# Patient Record
Sex: Female | Born: 1965
Health system: Southern US, Community
[De-identification: ages and names within clinical notes are randomized; demographics above are authoritative.]

## PROBLEM LIST (undated history)

## (undated) DIAGNOSIS — F32A Depression, unspecified: Secondary | ICD-10-CM

## (undated) DIAGNOSIS — E785 Hyperlipidemia, unspecified: Secondary | ICD-10-CM

## (undated) DIAGNOSIS — F329 Major depressive disorder, single episode, unspecified: Secondary | ICD-10-CM

## (undated) DIAGNOSIS — I10 Essential (primary) hypertension: Secondary | ICD-10-CM

## (undated) DIAGNOSIS — K5792 Diverticulitis of intestine, part unspecified, without perforation or abscess without bleeding: Secondary | ICD-10-CM

## (undated) DIAGNOSIS — T7840XA Allergy, unspecified, initial encounter: Secondary | ICD-10-CM

## (undated) DIAGNOSIS — M1990 Unspecified osteoarthritis, unspecified site: Secondary | ICD-10-CM

## (undated) DIAGNOSIS — F419 Anxiety disorder, unspecified: Secondary | ICD-10-CM

## (undated) DIAGNOSIS — K8020 Calculus of gallbladder without cholecystitis without obstruction: Secondary | ICD-10-CM

## (undated) DIAGNOSIS — R609 Edema, unspecified: Secondary | ICD-10-CM

## (undated) DIAGNOSIS — K219 Gastro-esophageal reflux disease without esophagitis: Secondary | ICD-10-CM

## (undated) HISTORY — DX: Allergy, unspecified, initial encounter: T78.40XA

## (undated) HISTORY — DX: Anxiety disorder, unspecified: F41.9

## (undated) HISTORY — DX: Unspecified osteoarthritis, unspecified site: M19.90

## (undated) HISTORY — PX: GALLBLADDER SURGERY: SHX652

## (undated) HISTORY — DX: Gastro-esophageal reflux disease without esophagitis: K21.9

## (undated) HISTORY — DX: Depression, unspecified: F32.A

## (undated) HISTORY — DX: Essential (primary) hypertension: I10

## (undated) HISTORY — DX: Edema, unspecified: R60.9

## (undated) HISTORY — DX: Calculus of gallbladder without cholecystitis without obstruction: K80.20

## (undated) HISTORY — DX: Hyperlipidemia, unspecified: E78.5

## (undated) HISTORY — DX: Major depressive disorder, single episode, unspecified: F32.9

## (undated) HISTORY — DX: Diverticulitis of intestine, part unspecified, without perforation or abscess without bleeding: K57.92

---

## 1997-07-15 ENCOUNTER — Inpatient Hospital Stay (HOSPITAL_COMMUNITY): Admission: AD | Admit: 1997-07-15 | Discharge: 1997-07-18 | Payer: Self-pay | Admitting: Psychiatry

## 1999-10-12 ENCOUNTER — Emergency Department (HOSPITAL_COMMUNITY): Admission: EM | Admit: 1999-10-12 | Discharge: 1999-10-12 | Payer: Self-pay | Admitting: Emergency Medicine

## 2000-04-11 ENCOUNTER — Other Ambulatory Visit: Admission: RE | Admit: 2000-04-11 | Discharge: 2000-04-11 | Payer: Self-pay | Admitting: Obstetrics

## 2000-07-04 ENCOUNTER — Encounter: Payer: Self-pay | Admitting: Obstetrics

## 2000-07-04 ENCOUNTER — Ambulatory Visit (HOSPITAL_COMMUNITY): Admission: RE | Admit: 2000-07-04 | Discharge: 2000-07-04 | Payer: Self-pay | Admitting: Obstetrics

## 2000-08-23 ENCOUNTER — Inpatient Hospital Stay (HOSPITAL_COMMUNITY): Admission: AD | Admit: 2000-08-23 | Discharge: 2000-08-23 | Payer: Self-pay | Admitting: Obstetrics

## 2000-09-12 ENCOUNTER — Encounter: Admission: RE | Admit: 2000-09-12 | Discharge: 2000-11-01 | Payer: Self-pay | Admitting: Internal Medicine

## 2000-10-09 ENCOUNTER — Inpatient Hospital Stay (HOSPITAL_COMMUNITY): Admission: AD | Admit: 2000-10-09 | Discharge: 2000-10-13 | Payer: Self-pay | Admitting: Obstetrics

## 2000-10-10 ENCOUNTER — Encounter: Payer: Self-pay | Admitting: Obstetrics

## 2000-11-24 ENCOUNTER — Ambulatory Visit (HOSPITAL_COMMUNITY): Admission: RE | Admit: 2000-11-24 | Discharge: 2000-11-28 | Payer: Self-pay | Admitting: Obstetrics

## 2000-12-02 ENCOUNTER — Emergency Department (HOSPITAL_COMMUNITY): Admission: EM | Admit: 2000-12-02 | Discharge: 2000-12-03 | Payer: Self-pay | Admitting: Emergency Medicine

## 2004-01-24 ENCOUNTER — Emergency Department (HOSPITAL_COMMUNITY): Admission: EM | Admit: 2004-01-24 | Discharge: 2004-01-24 | Payer: Self-pay | Admitting: Emergency Medicine

## 2004-04-16 ENCOUNTER — Emergency Department (HOSPITAL_COMMUNITY): Admission: EM | Admit: 2004-04-16 | Discharge: 2004-04-16 | Payer: Self-pay

## 2004-11-02 ENCOUNTER — Emergency Department (HOSPITAL_COMMUNITY): Admission: EM | Admit: 2004-11-02 | Discharge: 2004-11-02 | Payer: Self-pay | Admitting: Family Medicine

## 2005-05-04 ENCOUNTER — Emergency Department (HOSPITAL_COMMUNITY): Admission: EM | Admit: 2005-05-04 | Discharge: 2005-05-04 | Payer: Self-pay | Admitting: Family Medicine

## 2005-06-09 ENCOUNTER — Emergency Department (HOSPITAL_COMMUNITY): Admission: EM | Admit: 2005-06-09 | Discharge: 2005-06-09 | Payer: Self-pay | Admitting: Family Medicine

## 2005-07-25 ENCOUNTER — Emergency Department (HOSPITAL_COMMUNITY): Admission: EM | Admit: 2005-07-25 | Discharge: 2005-07-25 | Payer: Self-pay | Admitting: Emergency Medicine

## 2005-09-29 ENCOUNTER — Emergency Department (HOSPITAL_COMMUNITY): Admission: EM | Admit: 2005-09-29 | Discharge: 2005-09-29 | Payer: Self-pay | Admitting: Family Medicine

## 2006-02-16 ENCOUNTER — Emergency Department (HOSPITAL_COMMUNITY): Admission: EM | Admit: 2006-02-16 | Discharge: 2006-02-16 | Payer: Self-pay | Admitting: Family Medicine

## 2008-11-14 ENCOUNTER — Emergency Department (HOSPITAL_COMMUNITY): Admission: EM | Admit: 2008-11-14 | Discharge: 2008-11-14 | Payer: Self-pay | Admitting: Family Medicine

## 2010-11-25 ENCOUNTER — Emergency Department (HOSPITAL_COMMUNITY): Payer: 59

## 2010-11-25 ENCOUNTER — Emergency Department (HOSPITAL_COMMUNITY)
Admission: EM | Admit: 2010-11-25 | Discharge: 2010-11-25 | Disposition: A | Discharge status: Home / Self Care | Payer: 59 | Attending: Emergency Medicine | Admitting: Emergency Medicine

## 2010-11-25 DIAGNOSIS — N898 Other specified noninflammatory disorders of vagina: Secondary | ICD-10-CM | POA: Insufficient documentation

## 2010-11-25 DIAGNOSIS — R1032 Left lower quadrant pain: Secondary | ICD-10-CM | POA: Insufficient documentation

## 2010-11-25 DIAGNOSIS — I10 Essential (primary) hypertension: Secondary | ICD-10-CM | POA: Insufficient documentation

## 2010-11-25 DIAGNOSIS — K5732 Diverticulitis of large intestine without perforation or abscess without bleeding: Secondary | ICD-10-CM | POA: Insufficient documentation

## 2010-11-25 LAB — CBC
MCH: 29.3 pg (ref 26.0–34.0)
MCHC: 34.6 g/dL (ref 30.0–36.0)
MCV: 84.7 fL (ref 78.0–100.0)
Platelets: 229 10*3/uL (ref 150–400)
RDW: 14.4 % (ref 11.5–15.5)

## 2010-11-25 LAB — COMPREHENSIVE METABOLIC PANEL
AST: 18 U/L (ref 0–37)
BUN: 7 mg/dL (ref 6–23)
CO2: 26 mEq/L (ref 19–32)
Calcium: 9.9 mg/dL (ref 8.4–10.5)
Chloride: 98 mEq/L (ref 96–112)
Creatinine, Ser: 0.74 mg/dL (ref 0.50–1.10)
GFR calc Af Amer: >60 mL/min (ref >60)
GFR calc non Af Amer: >60 mL/min (ref >60)
Glucose, Bld: 110 mg/dL — ABNORMAL HIGH (ref 70–99)
Total Bilirubin: 0.6 mg/dL (ref 0.3–1.2)

## 2010-11-25 LAB — DIFFERENTIAL
Basophils Relative: 0 % (ref 0–1)
Eosinophils Absolute: 0.1 10*3/uL (ref 0.0–0.7)
Eosinophils Relative: 1 % (ref 0–5)
Lymphs Abs: 2 10*3/uL (ref 0.7–4.0)
Monocytes Relative: 6 % (ref 3–12)
Neutrophils Relative %: 76 % (ref 43–77)

## 2010-11-25 LAB — URINALYSIS, ROUTINE W REFLEX MICROSCOPIC
Bilirubin Urine: NEGATIVE
Glucose, UA: NEGATIVE mg/dL
Hgb urine dipstick: NEGATIVE
Ketones, ur: NEGATIVE mg/dL
Nitrite: NEGATIVE
Protein, ur: NEGATIVE mg/dL
Specific Gravity, Urine: 1.019 (ref 1.005–1.030)
Urobilinogen, UA: 1 mg/dL (ref 0.0–1.0)
pH: 6.5 (ref 5.0–8.0)

## 2010-11-25 LAB — URINE MICROSCOPIC-ADD ON

## 2010-11-25 LAB — POCT PREGNANCY, URINE: Preg Test, Ur: NEGATIVE

## 2010-11-25 LAB — GC/CHLAMYDIA PROBE AMP, GENITAL: GC Probe Amp, Genital: NEGATIVE

## 2010-11-25 LAB — WET PREP, GENITAL: Yeast Wet Prep HPF POC: NONE SEEN

## 2010-11-25 MED ORDER — IOHEXOL 300 MG/ML  SOLN
100.0000 mL | Freq: Once | INTRAMUSCULAR | Status: AC | PRN
  Administered 2010-11-25: 100 mL via INTRAVENOUS

## 2010-12-02 ENCOUNTER — Encounter: Payer: Self-pay | Admitting: Internal Medicine

## 2010-12-02 ENCOUNTER — Ambulatory Visit (INDEPENDENT_AMBULATORY_CARE_PROVIDER_SITE_OTHER): Payer: 59 | Admitting: Internal Medicine

## 2010-12-02 DIAGNOSIS — I10 Essential (primary) hypertension: Secondary | ICD-10-CM | POA: Insufficient documentation

## 2010-12-02 DIAGNOSIS — F17200 Nicotine dependence, unspecified, uncomplicated: Secondary | ICD-10-CM

## 2010-12-02 DIAGNOSIS — K5732 Diverticulitis of large intestine without perforation or abscess without bleeding: Secondary | ICD-10-CM | POA: Insufficient documentation

## 2010-12-02 DIAGNOSIS — K5792 Diverticulitis of intestine, part unspecified, without perforation or abscess without bleeding: Secondary | ICD-10-CM

## 2010-12-02 DIAGNOSIS — R933 Abnormal findings on diagnostic imaging of other parts of digestive tract: Secondary | ICD-10-CM

## 2010-12-02 DIAGNOSIS — N814 Uterovaginal prolapse, unspecified: Secondary | ICD-10-CM

## 2010-12-02 MED ORDER — PEG-KCL-NACL-NASULF-NA ASC-C 100 G PO SOLR: 1.0000 | ORAL | Status: DC

## 2010-12-02 NOTE — Patient Instructions (Signed)
You have been scheduled to have a Colonoscopy. Your prep has been sent to your pharmacy. You have been referred to a GYN.  Your appointment is at :   [facility] [address], [address] [place] [address] Phone: [phone] Fax: [phone]  on 12/14/10 at 2:20 pm please arrive 30 minutes early.

## 2010-12-02 NOTE — Progress Notes (Signed)
Subjective:    Patient ID: Brandy [name], female    DOB: 02-Oct-1965, 45 y.o.   MRN: [id number]  HPI Brandy [name] is a 45 year old female with a PMH of hypertension and a more recent diagnosis of diverticulitis presenting for the 1st time to our clinic for followup of diverticulitis and abnormal GI imaging.  She is alone today.  She reports she developed left lower quadrant abdominal pain on August 21 and this progressed over 48 hours to sharp constant pain, which necessitated an ED visit.  At that time pain was her primary complaint, though she did have some nausea. No vomiting. She does not recall fever or chills. She reports the pain felt similar to her menstrual pain but occurred at a time when she was not menstruating.  She reports around this time her bowel movements have been normal for her, which usually is 2-3 soft but formed bowel movements daily.  She denies rectal bleeding, hematochezia and melena.  In the ER she was diagnosed with diverticulitis and given a prescription for ciprofloxacin and metronidazole which she remains on today. She has approximately 3-1/2 days left of this medication.    Today her pain is gone and she feels much much better. She does report a bad taste in her mouth and discoloration of her tongue since starting this medication.  She also has noted her bowel movements have been more loose and explosive while on antibiotics, but still no bleeding or melena.  Her nausea has also resolved.  On a side note she does report intermittent uterine prolapse which normally happens when sitting or moving her bowels. She has manually reduced her uterus on multiple occasions.  She has not been seen by gynecologist for this.  Review of Systems Constitutional: Negative for fever, chills, night sweats, activity change, appetite change and unexpected weight change HEENT: Negative for sore throat, mouth sores and trouble swallowing. Eyes: Negative for visual disturbance Respiratory:  Negative for cough, chest tightness and shortness of breath Cardiovascular: Negative for chest pain, palpitations and lower extremity swelling Gastrointestinal: [name] history of present illness Genitourinary: Negative for dysuria and hematuria. Musculoskeletal: Negative for back pain, arthralgias and myalgias Skin: Negative for rash or color change Neurological: Negative for headaches, weakness, numbness Hematological: Negative for adenopathy, negative for easy bruising/bleeding Psychiatric/behavioral: Negative for depressed mood, negative for anxiety  PMH: HTN Diverticulitis ? Uterine prolapse  Current outpatient prescriptions:ciprofloxacin (CIPRO) 500 MG tablet, Take 500 mg by mouth 2 (two) times daily.  , Disp: , Rfl: ;  cloNIDine HCl (KAPVAY) 0.1 MG TB12 ER tablet, Take 0.1 mg by mouth at bedtime.  , Disp: , Rfl: ;  hydrochlorothiazide 25 MG tablet, Take 25 mg by mouth daily.  , Disp: , Rfl: ;  HYDROcodone-acetaminophen (NORCO) 5-325 MG per tablet, Take 1 tablet by mouth every 6 (six) hours as needed.  , Disp: , Rfl:  metroNIDAZOLE (FLAGYL) 500 MG tablet, Take 500 mg by mouth 3 (three) times daily.  , Disp: , Rfl: ;  peg 3350 powder (MOVIPREP) 100 G SOLR, Take 1 kit (100 g total) by mouth as directed. [name] written handout, Disp: 1 kit, Rfl: 0  Allergies  Allergen Reactions  . Azithromycin   . Shellfish Allergy    Family History  Problem Relation Age of Onset  . Coronary artery disease Paternal Grandmother     young age  . Hypertension Father   . Diabetes Sister   . Diabetes Paternal Grandmother   -neg for CRC and colon polyps  Social History  . Marital Status: Divorced    Number of Children: 3   Occupational History  . Nurse [employer]   Social History Main Topics  . Smoking status: Current Everyday Smoker -- 0.5 packs/day for 1.5 years    Types: Cigarettes  . Smokeless tobacco: Never Used  . Alcohol Use: Yes     occ  . Drug Use: No  . Sexually Active: None       Objective:   Physical Exam BP 118/70  Pulse 68  Ht 5\' 1"  (1.549 m)  Wt 182 lb (82.555 kg)  BMI 34.39 kg/m2  LMP 11/20/2010 Constitutional: Well-developed and well-nourished. No distress. HEENT: Normocephalic and atraumatic. Oropharynx is clear and moist. No oropharyngeal exudate. Conjunctivae are normal. Pupils are equal round and reactive to light. No scleral icterus. Neck: Neck supple. Trachea midline. Cardiovascular: Normal rate, regular rhythm and intact distal pulses. No M/R/G Pulmonary/chest: Effort normal and breath sounds normal. No wheezing, rales or rhonchi. Abdominal: Soft, nontender, nondistended. There are no masses palpable. No hepatosplenomegaly. Lymphadenopathy: No cervical adenopathy noted. Neurological: Alert and oriented to person place and time. Skin: Skin is warm and dry. No rashes noted. Psychiatric: Normal mood and affect. Behavior is normal.  CBC    Component Value Date/Time   WBC 11.4* 11/25/2010 0825   RBC 4.84 11/25/2010 0825   HGB 14.2 11/25/2010 0825   HCT 41.0 11/25/2010 0825   PLT 229 11/25/2010 0825   MCV 84.7 11/25/2010 0825   MCH 29.3 11/25/2010 0825   MCHC 34.6 11/25/2010 0825   RDW 14.4 11/25/2010 0825   LYMPHSABS 2.0 11/25/2010 0825   MONOABS 0.7 11/25/2010 0825   EOSABS 0.1 11/25/2010 0825   BASOSABS 0.0 11/25/2010 0825    CMP     Component Value Date/Time   NA 135 11/25/2010 0825   K 3.4* 11/25/2010 0825   CL 98 11/25/2010 0825   CO2 26 11/25/2010 0825   GLUCOSE 110* 11/25/2010 0825   BUN 7 11/25/2010 0825   CREATININE 0.74 11/25/2010 0825   CALCIUM 9.9 11/25/2010 0825   PROT 8.8* 11/25/2010 0825   ALBUMIN 3.9 11/25/2010 0825   AST 18 11/25/2010 0825   ALT 13 11/25/2010 0825   ALKPHOS 72 11/25/2010 0825   BILITOT 0.6 11/25/2010 0825   GFRNONAA >60 11/25/2010 0825   GFRAA >60 11/25/2010 0825   CT abd/pelvis with contrast: 11/25/10: Findings: Lung bases are essentially clear.  Liver, spleen, pancreas, and adrenal glands within normal limits.    Status post cholecystectomy. No intrahepatic or extrahepatic  ductal dilatation.  Kidneys within normal limits. No hydronephrosis.  No evidence of bowel obstruction. Normal appendix. Colonic wall  thickening with inflammatory changes in the left pelvis (series  2/image 60), likely reflecting a mild sigmoid diverticulitis. No  drainable fluid collection or abscess.  No evidence of abdominal aortic aneurysm.  No suspicious abdominopelvic lymphadenopathy.  Trace pelvic fluid, likely physiologic.  Uterus and bilateral ovaries are unremarkable.  Bladder is within normal limits.  Degenerative changes of the visualized thoracolumbar spine.   IMPRESSION:  Colonic wall thickening with inflammatory changes in the left  pelvis, likely reflecting a mild sigmoid diverticulitis. No  drainable fluid collection or abscess.  Given the appearance and relative lack of additional colonic  diverticuli, colonoscopic evaluation is suggested following  resolution of acute inflammatory process.    Assessment & Plan:  45 year old female with a PMH of hypertension and a more recent diagnosis of diverticulitis presenting for the 1st time to  our clinic for followup of diverticulitis and abnormal GI imaging  1. Diverticulitis - the patient has responded very nicely to ciprofloxacin and metronidazole. She should continue and complete this ten-day course of medication. Once her infection is completely resolved, I do recommend colonoscopy mass or malignancy near the area of recent inflammation.  We briefly discussed the possibility of recurrent infection and if this occurs she should contact us immediately.  2.  ? Uterine prolapse - the patient reports a palpable mass in her vagina which reduces manually and seems to be associated with Valsalva.  This certainly could represent uterine prolapse I'll refer her to GYN for further evaluation.

## 2011-01-07 ENCOUNTER — Ambulatory Visit (AMBULATORY_SURGERY_CENTER): Payer: 59 | Admitting: Internal Medicine

## 2011-01-07 ENCOUNTER — Encounter: Payer: Self-pay | Admitting: Internal Medicine

## 2011-01-07 VITALS — BP 122/85 | HR 74 | Temp 99.3°F | Resp 14 | Ht 61.0 in | Wt 182.0 lb

## 2011-01-07 DIAGNOSIS — R933 Abnormal findings on diagnostic imaging of other parts of digestive tract: Secondary | ICD-10-CM

## 2011-01-07 MED ORDER — SODIUM CHLORIDE 0.9 % IV SOLN: 500.0000 mL | INTRAVENOUS | Status: DC

## 2011-01-07 NOTE — Progress Notes (Signed)
Readjusting bp cuff as readings 50's/30's, pt awake, talking. After readjustment, 108/73

## 2011-01-07 NOTE — Patient Instructions (Signed)
Mild diverticulosis, small external hemorrhoids  Recall colonoscopy in 10 years.  There is no need for stool testing for at least 5 years.  See blue and green sheets for additional d/c instructions.

## 2011-01-07 NOTE — Progress Notes (Signed)
Son can be in recovery to speak with Dr but not his girlfriend.

## 2011-01-10 ENCOUNTER — Telehealth: Payer: Self-pay | Admitting: *Deleted

## 2011-01-10 NOTE — Telephone Encounter (Signed)
Message left with identifier cell phone to call if concerns or questions.

## 2011-02-22 ENCOUNTER — Encounter: Payer: Self-pay | Admitting: Internal Medicine

## 2011-02-28 ENCOUNTER — Telehealth: Payer: Self-pay | Admitting: Internal Medicine

## 2011-02-28 ENCOUNTER — Ambulatory Visit: Payer: 59 | Admitting: Internal Medicine

## 2011-03-01 NOTE — Telephone Encounter (Signed)
No charge per Dr. Pyrtle °

## 2011-03-09 ENCOUNTER — Encounter: Payer: Self-pay | Admitting: Internal Medicine

## 2011-03-14 ENCOUNTER — Ambulatory Visit (INDEPENDENT_AMBULATORY_CARE_PROVIDER_SITE_OTHER): Payer: 59 | Admitting: Internal Medicine

## 2011-03-14 ENCOUNTER — Encounter: Payer: Self-pay | Admitting: Internal Medicine

## 2011-03-14 VITALS — BP 130/86 | HR 88 | Ht 61.0 in | Wt 183.0 lb

## 2011-03-14 DIAGNOSIS — R1013 Epigastric pain: Secondary | ICD-10-CM

## 2011-03-14 DIAGNOSIS — R112 Nausea with vomiting, unspecified: Secondary | ICD-10-CM

## 2011-03-14 MED ORDER — ONDANSETRON HCL 4 MG PO TABS: 4.0000 mg | ORAL_TABLET | Freq: Four times a day (QID) | ORAL | Status: DC | PRN

## 2011-03-14 MED ORDER — PANTOPRAZOLE SODIUM 40 MG PO TBEC: 40.0000 mg | DELAYED_RELEASE_TABLET | Freq: Every day | ORAL | Status: DC

## 2011-03-14 NOTE — Progress Notes (Signed)
Subjective:    Patient ID: Brandy [name], female    DOB: 1965-04-08, 45 y.o.   MRN: [id number]  HPI Brandy [name] is a 45 year old female who I saw in October for colonoscopy after an episode of diverticulitis, returning today with a new complaint of epigastric abdominal pain. Of note her colonoscopy was unremarkable except for diverticulosis. She was put on the recall for 10 years.  Today she reports 3-4 weeks of epigastric pain which radiates around to her right and into her back. She reports significant nausea associated with this pain. Her nausea has been daily, and is present when she wakes up. She has only vomited on several occasions. No hematemesis, no melena. She was given a prescription for promethazine, which helps and makes her sleepy, and therefore she has not been able to use this while working. The pain is more intermittent, and she describes this as a dull aching pain. She has associated increased upper GI gas with belching and borborygmi. She was given a trial of omeprazole 20 mg, but she has not had significant relief. She does remember having a cholecystectomy some years ago for gallstones. No fevers or chills. Bowel movements have been fairly regular but over the last several days she's noted some constipation. No rectal bleeding.  Review of Systems As per history of present illness otherwise neck  Patient Active Problem List  Diagnoses  . HTN (hypertension)  . Diverticulitis of sigmoid colon  . Tobacco dependence   Past Surgical History  Procedure Date  . Gallbladder surgery    Current Outpatient Prescriptions  Medication Sig Dispense Refill  . lisinopril-hydrochlorothiazide (PRINZIDE,ZESTORETIC) 20-25 MG per tablet Take 1 tablet by mouth daily.        . promethazine (PHENERGAN) 25 MG tablet Take 25 mg by mouth 4 (four) times daily as needed.         Allergies  Allergen Reactions  . Azithromycin   . Contrast Media (Iodinated Diagnostic Agents) Hives  . Shellfish  Allergy    Family History  Problem Relation Age of Onset  . Coronary artery disease Paternal Grandmother     young age  . Hypertension Father   . Diabetes Sister   . Diabetes Paternal Grandmother    History   Social History  . Marital Status: Divorced    Spouse Name: N/A    Number of Children: 3  . Years of Education: N/A   Occupational History  . Nurse [employer]   Social History Main Topics  . Smoking status: Current Everyday Smoker -- 0.5 packs/day for 1.5 years    Types: Cigarettes  . Smokeless tobacco: Never Used  . Alcohol Use: Yes     occ  . Drug Use: No      Objective:   Physical Exam BP 130/86  Pulse 88  Ht 5\' 1"  (1.549 m)  Wt 183 lb (83.008 kg)  BMI 34.58 kg/m2  LMP 02/28/2011 Constitutional: Well-developed and well-nourished. No distress. HEENT: Normocephalic and atraumatic. Oropharynx is clear and moist. No oropharyngeal exudate. Conjunctivae are normal. Pupils are equal round and reactive to light. No scleral icterus. Neck: Neck supple. Trachea midline. Cardiovascular: Normal rate, regular rhythm and intact distal pulses. No M/R/G Pulmonary/chest: Effort normal and breath sounds normal. No wheezing, rales or rhonchi. Abdominal: Soft, very mild epigastric tenderness without rebound or guard, nondistended. Bowel sounds active throughout. There are no masses palpable. No hepatosplenomegaly. Extremities: no clubbing, cyanosis, or edema Lymphadenopathy: No cervical adenopathy noted. Neurological: Alert and oriented to  person place and time. Skin: Skin is warm and dry. No rashes noted. Psychiatric: Normal mood and affect. Behavior is normal.    Assessment & Plan:  Is a 45 year old woman with a history of diverticulitis resolved with an unremarkable colonoscopy except for diverticulosis who presents with 3-4 weeks epigastric abdominal pain with nausea.  1. Epigastric pain/nausea -- the patient is status post cholecystectomy which makes a biliary source  for her pain more unlikely. Certainly this could be ulcer disease or gastritis. She does not use NSAIDs on a regular basis. At this point I like to change her to more potent PPI, so therefore I am discontinuing omeprazole and starting her on pantoprazole 40 mg daily. I will add ondansetron 4 mg by mouth every 6 hours when necessary for nausea. This should not make her sleepy and therefore she can use this when working. She can still use promethazine as a backup for nausea as needed. I also will schedule her for upper endoscopy for further evaluation of her pain and to rule out ulcer disease and H. pylori. I will check a CBC,   comprehensive metabolic panel, and a lipase.  I will [name] her back after the EGD as needed.

## 2011-03-14 NOTE — Patient Instructions (Addendum)
You have been given a separate informational sheet regarding your tobacco use, the importance of quitting and local resources to help you quit.  You have been scheduled for an endoscopy. Please follow written instructions given to you at your visit today.  We have sent the following medications to your pharmacy for you to pick up at your convenience: Zofran take in place of the Phenergan every 6hrs as needed. And Protonix  Start taking Protonix 1 tablet daily Discontinue Omeprozole

## 2011-04-14 ENCOUNTER — Ambulatory Visit (AMBULATORY_SURGERY_CENTER): Payer: 59 | Admitting: Internal Medicine

## 2011-04-14 ENCOUNTER — Encounter: Payer: Self-pay | Admitting: Internal Medicine

## 2011-04-14 DIAGNOSIS — K2970 Gastritis, unspecified, without bleeding: Secondary | ICD-10-CM

## 2011-04-14 DIAGNOSIS — K2990 Gastroduodenitis, unspecified, without bleeding: Secondary | ICD-10-CM

## 2011-04-14 DIAGNOSIS — R112 Nausea with vomiting, unspecified: Secondary | ICD-10-CM

## 2011-04-14 DIAGNOSIS — R1013 Epigastric pain: Secondary | ICD-10-CM

## 2011-04-14 MED ORDER — SODIUM CHLORIDE 0.9 % IV SOLN: 500.0000 mL | INTRAVENOUS | Status: DC

## 2011-04-14 MED ORDER — PROMETHAZINE HCL 25 MG PO TABS: 25.0000 mg | ORAL_TABLET | Freq: Three times a day (TID) | ORAL | Status: DC | PRN

## 2011-04-14 NOTE — Progress Notes (Signed)
Patient did not experience any of the following events: a burn prior to discharge; a fall within the facility; wrong site/side/patient/procedure/implant event; or a hospital transfer or hospital admission upon discharge from the facility. (G8907) Patient did not have preoperative order for IV antibiotic SSI prophylaxis. (G8918)  

## 2011-04-14 NOTE — Patient Instructions (Signed)
Mild gastritis- handout given on gastritis  Continue taking your medicine for reflux daily. Please take this 20-30 minutes prior to breakfast or your first meal of the day  Continue all your other medicines as directed  We will mail you a letter in 1-2 weeks with pathology results and dr pyrtle's recommendations.

## 2011-04-14 NOTE — Op Note (Signed)
[facility] [address] [place], [place]  [address]  ENDOSCOPY PROCEDURE REPORT  PATIENT:  Brandy, [name]  MR#:  [id number] BIRTHDATE:  Mar 18, 1966, 45 yrs. old  GENDER:  female ENDOSCOPIST:  Carie Caddy. Shekira Drummer, MD  PROCEDURE DATE:  04/14/2011 PROCEDURE:  EGD with biopsy for H. pylori 78295 ASA CLASS:  Class II INDICATIONS:  epigastric pain, nausea MEDICATIONS:   propofol (Diprivan) 200 mg IV, MAC sedation, administered by CRNA TOPICAL ANESTHETIC:  none  DESCRIPTION OF PROCEDURE:   After the risks benefits and alternatives of the procedure were thoroughly explained, informed consent was obtained.  The LB GIF-H180 [id number] endoscope was introduced through the mouth and advanced to the second portion of the duodenum, without limitations.  The instrument was slowly withdrawn as the mucosa was fully examined. <<PROCEDUREIMAGES>>  The esophagus and gastroesophageal junction were completely normal in appearance.  Mild to moderate gastritis was found in the body and the antrum of the stomach. Multiple biopsies were obtained and sent to pathology.  The duodenal bulb was normal in appearance, as was the postbulbar duodenum.    Retroflexed views revealed no abnormalities.    The scope was then withdrawn from the patient and the procedure completed.  COMPLICATIONS:  None  ENDOSCOPIC IMPRESSION: 1) Normal esophagus 2) Mild gastritis in the body and the antrum of the stomach. Biopsies taken and sent to pathology. 3) Normal duodenum + RECOMMENDATIONS: 1) Await pathology results 2) Follow-up of helicobacter pylori status, treat if indicated 3) Continue taking your PPI (antiacid medicine) once daily. It is best to be taken 20-30 minutes prior to breakfast meal.4) Continue as needed and as directed anti-nausea medication.  Carie Caddy. Pranika Finks, MD  CC:  The Patient  n. eSIGNED:   Carie Caddy. Genever Hentges at 04/14/2011 08:41 AM  Kayren Eaves, [phone]

## 2011-04-15 ENCOUNTER — Telehealth: Payer: Self-pay | Admitting: *Deleted

## 2011-04-15 NOTE — Telephone Encounter (Signed)
Follow up Call- Patient questions:  Do you have a fever, pain , or abdominal swelling? yes Pain Score  Pt states yesterday was "9" but seems better today.  Have you tolerated food without any problems? yes  Have you been able to return to your normal activities? yes  Do you have any questions about your discharge instructions: Diet   no Medications  no Follow up visit  no  Do you have questions or concerns about your Care? no  Actions: * If pain score is 4 or above: No action needed, pain <4.  Patient c/o pain in upper back yesterday, patient states she feels this is gas and she was able to pass a lot of gas yesterday. She feels better today but still having soreness in upper back. Pt denies any fever or nausea,vomiting. Patient tolerated food. She thinks its just gas per patient. Encouraged patient to call us back TODAY if not feeling much better. Explained to use heating pad to area and try to get rest of air out. She understands and agrees with recommendations.

## 2011-04-19 ENCOUNTER — Encounter: Payer: Self-pay | Admitting: Internal Medicine

## 2011-04-21 ENCOUNTER — Telehealth: Payer: Self-pay | Admitting: *Deleted

## 2011-04-21 NOTE — Telephone Encounter (Signed)
Pt called for results of bx from EGD. Explained to pt that Dr Rhea Belton has mailed a letter explaining her paths were negative; pt stated understanding.

## 2011-06-09 ENCOUNTER — Other Ambulatory Visit (INDEPENDENT_AMBULATORY_CARE_PROVIDER_SITE_OTHER): Payer: 59

## 2011-06-09 ENCOUNTER — Encounter: Payer: Self-pay | Admitting: Internal Medicine

## 2011-06-09 ENCOUNTER — Ambulatory Visit (INDEPENDENT_AMBULATORY_CARE_PROVIDER_SITE_OTHER): Payer: 59 | Admitting: Internal Medicine

## 2011-06-09 ENCOUNTER — Encounter: Payer: Self-pay | Admitting: Gastroenterology

## 2011-06-09 VITALS — BP 124/84 | HR 80 | Temp 98.4°F | Ht 61.0 in | Wt 179.4 lb

## 2011-06-09 DIAGNOSIS — Z7289 Other problems related to lifestyle: Secondary | ICD-10-CM

## 2011-06-09 DIAGNOSIS — R112 Nausea with vomiting, unspecified: Secondary | ICD-10-CM

## 2011-06-09 LAB — COMPREHENSIVE METABOLIC PANEL
ALT: 15 U/L (ref 0–35)
AST: 16 U/L (ref 0–37)
Calcium: 9.6 mg/dL (ref 8.4–10.5)
Chloride: 99 mEq/L (ref 96–112)
Creatinine, Ser: 0.8 mg/dL (ref 0.4–1.2)
Sodium: 137 mEq/L (ref 135–145)

## 2011-06-09 LAB — CBC WITH DIFFERENTIAL/PLATELET
Basophils Absolute: 0 10*3/uL (ref 0.0–0.1)
Eosinophils Absolute: 0.2 10*3/uL (ref 0.0–0.7)
Hemoglobin: 14 g/dL (ref 12.0–15.0)
Lymphocytes Relative: 21.7 % (ref 12.0–46.0)
MCHC: 33.7 g/dL (ref 30.0–36.0)
Neutro Abs: 6.1 10*3/uL (ref 1.4–7.7)
Neutrophils Relative %: 67.6 % (ref 43.0–77.0)
Platelets: 238 10*3/uL (ref 150.0–400.0)
RDW: 15.2 % — ABNORMAL HIGH (ref 11.5–14.6)

## 2011-06-09 MED ORDER — PROCHLORPERAZINE MALEATE 10 MG PO TABS: 5.0000 mg | ORAL_TABLET | Freq: Four times a day (QID) | ORAL | Status: AC | PRN

## 2011-06-09 NOTE — Patient Instructions (Addendum)
You have been given a separate informational sheet regarding your tobacco use, the importance of quitting and local resources to help you quit.  Your physician has requested that you go to the basement for the following lab work before leaving today: CBC, CMP, Lipase, Amylase  We have sent the following medications to your pharmacy for you to pick up at your convenience: Compazine; please take as directed.  Please abstain from Alcohol.  Follow up with Dr. Rhea Belton in 3 weeks

## 2011-06-09 NOTE — Progress Notes (Signed)
Subjective:    Patient ID: Brandy [name], female    DOB: 15-May-1965, 46 y.o.   MRN: [id number]  HPI Brandy [name] is a 46 year old female with a past medical history of hypertension, single episode of diverticulitis the fall 2012 who is seen in followup for ongoing nausea with vomiting. I last patient in January for ongoing upper GI issues and she underwent upper endoscopy which revealed mild gastritis, but biopsies were H. pylori negative. Today she reports ongoing issues with early morning nausea, often with vomiting. She reports she wakes with this symptom and struggles with nausea and off-and-on vomiting throughout the day. She reports this is not particularly worse after eating, but it is hard to eat when she is nauseous. He reports this is causing significant stress in her life and making it difficult to work. She denies abdominal pain as a predominant issue. She is not having hematemesis. She is not having diarrhea or constipation, and she has had no lower abdominal pain similar to previous episode of diverticulitis. No fevers or chills. No new medicines. The Zofran she is using but it is not helping. She does remain on Protonix 40 mg daily. She ran out of Phenergan and has not been taking this also notes this to make her very sleepy. She was recently seen by urgent care and diagnosed with a URI and started on Amoxil plus Robitussin-AC. She reports overall her appetite is poor and has lost about 10-12 pounds. She does report alcohol use but normally towards the end of the week and weekends. When asked she is not extremely specific with amount of alcohol intake.  Review of Systems Aspirin history of present illness, otherwise negative  Current Medications, Allergies, Past Medical History, Past Surgical History, Family History and Social History were reviewed in [employer] record.     Objective:   Physical Exam BP 124/84  Pulse 80  Temp 98.4 F (36.9 C)  Ht 5\' 1"   (1.549 m)  Wt 179 lb 7 oz (81.392 kg)  BMI 33.90 kg/m2  LMP 05/19/2011 Constitutional: Well-developed and well-nourished. No distress. HEENT: Normocephalic and atraumatic. Oropharynx is clear and moist. No oropharyngeal exudate. Conjunctivae are normal. Pupils are equal round and reactive to light. No scleral icterus. Neck: Neck supple. Trachea midline. Cardiovascular: Normal rate, regular rhythm and intact distal pulses. No M/R/G Pulmonary/chest: Effort normal and breath sounds normal. No wheezing, rales or rhonchi. Abdominal: Soft, nontender, nondistended. Bowel sounds active throughout. There are no masses palpable. No hepatosplenomegaly. Extremities: no clubbing, cyanosis, or edema Lymphadenopathy: No cervical adenopathy noted. Neurological: Alert and oriented to person place and time. Skin: Skin is warm and dry. No rashes noted. Psychiatric: Normal mood and affect. Behavior is normal.     Assessment & Plan:  46 year old female with a past medical history of hypertension, single episode of diverticulitis the fall 2012 who is seen in followup for ongoing nausea with vomiting  1. N/V -- she had a recent upper endoscopy which did not reveal structural lesions to explain her symptoms such as ulcer disease. She also had cross-sectional imaging in August of 2012 which did not reveal any concern other than the sigmoid diverticulitis which was treated, resolved, and then documented to be nothing more than isolated diverticulitis by colonoscopy. I do think, that she is taking more alcohol than she is letting on. This very well may contribute to her nausea and vomiting. Other potential etiologies include gastroparesis, but she does not have diabetes, and she is not vomiting  undigested food. Today I would like to check labs including CBC, comp his metabolic panel, amylase and lipase. I've asked that she make great strides to avoid all alcohol to [name] if this improves her symptoms. She is agreeable to try  this. I also will discontinue the Zofran and Phenergan and give her a trial of Compazine 5-10 mg every 8 hours when necessary nausea. I will [name] her back in 2-3 weeks' time to reassess her symptoms. We could consider gastric imaging study and repeat cross-sectional imaging if her symptoms remain.  She is agreeable to this plan. She is given a work note for today and tomorrow

## 2011-06-13 ENCOUNTER — Telehealth: Payer: Self-pay | Admitting: *Deleted

## 2011-06-13 MED ORDER — PROMETHAZINE HCL 25 MG RE SUPP: 25.0000 mg | Freq: Four times a day (QID) | RECTAL | Status: DC | PRN

## 2011-06-13 NOTE — Telephone Encounter (Signed)
lmom for pt to call back

## 2011-06-13 NOTE — Telephone Encounter (Signed)
I am not certain what has caused her vomiting.  She had fairly unremarkable EGD recently. Can rx phenergan supp if compazine is not working. Should not take both of these meds at same time. She was not hurting late last week, is she now? If so, then would repeat CT abd/pelvis. May need UC or repeat visit in our office if persistent vomiting.

## 2011-06-13 NOTE — Telephone Encounter (Signed)
Pt reports she is still vomiting; she went to work and they sent her home d/t the vomiting. She reports she starts coughing and then starts vomiting. Pt reports the day before she came to see [name], she went to an UC and was given cough syrup with Codeine. Pt reports she has taken the Compazine, but doesn't know if it helped any better than the other 2 antiemetics she tried. She has not had any Phenergan suppositories. Pt wants to go back to UC, but she was asked to wait until after I spoke with Dr Rhea Belton. Please advise.

## 2011-06-13 NOTE — Telephone Encounter (Signed)
Spoke with pt who went to [facility] and was given an inhaler for bronchitis- like s&s . She denies pain, but states she still has the discomfort in the middle of her stomach under the breast bone that can cause the nausea she experiences. She states she wakes up nauseated.   Instructed pt I will order Phenergan suppositories and she can insert one every six hours; she will probably get sleepy . She should insert one and then try to take liquids and maybe crackers; hopefully she can advance her diet. Advised her not to take Phenergan and Compazine at the same time. She will try to take the cough med with codeine and amoxicillin with some food on her stomach. She will call and update [name] on her condition.

## 2011-11-24 ENCOUNTER — Telehealth: Payer: Self-pay | Admitting: *Deleted

## 2011-11-24 MED ORDER — LISINOPRIL-HYDROCHLOROTHIAZIDE 20-25 MG PO TABS: 1.0000 | ORAL_TABLET | Freq: Every day | ORAL | Status: DC

## 2011-11-24 NOTE — Telephone Encounter (Signed)
If the medication in question is lisinopril/HCTZ, then I am fine renewing for 1 month while she is awaiting PCP.

## 2011-11-24 NOTE — Telephone Encounter (Signed)
Pt has no PCP, has been going to [facility] for scripts. She is in the process of finding a PCP, but needs her bp med filled; would you do this for 30 days? Thanks.

## 2011-11-24 NOTE — Telephone Encounter (Signed)
Informed pt Dr Rhea Belton will fill her BP med for 30 days while she finds another PCP; pt stated understanding.

## 2012-03-06 ENCOUNTER — Ambulatory Visit: Payer: 59 | Admitting: Nurse Practitioner

## 2012-03-06 ENCOUNTER — Telehealth: Payer: Self-pay | Admitting: *Deleted

## 2012-03-06 NOTE — Telephone Encounter (Signed)
Pt reports she went to the [facility] this am and instead of stool, she had blood. She is having cramping above her belly button. She had an episode of diverticulitis in 2012 per Dr Lauro Franklin note. Pt will see Willette Cluster, NP today at 3:30pm.

## 2012-11-05 ENCOUNTER — Emergency Department (HOSPITAL_COMMUNITY)
Admission: EM | Admit: 2012-11-05 | Discharge: 2012-11-05 | Disposition: A | Discharge status: Home / Self Care | Payer: 59 | Source: Home / Self Care | Attending: Family Medicine | Admitting: Family Medicine

## 2012-11-05 ENCOUNTER — Encounter (HOSPITAL_COMMUNITY): Payer: Self-pay | Admitting: Emergency Medicine

## 2012-11-05 DIAGNOSIS — L03319 Cellulitis of trunk, unspecified: Secondary | ICD-10-CM

## 2012-11-05 DIAGNOSIS — L02219 Cutaneous abscess of trunk, unspecified: Secondary | ICD-10-CM

## 2012-11-05 DIAGNOSIS — L02211 Cutaneous abscess of abdominal wall: Secondary | ICD-10-CM

## 2012-11-05 MED ORDER — DOXYCYCLINE HYCLATE 100 MG PO CAPS: 100.0000 mg | ORAL_CAPSULE | Freq: Two times a day (BID) | ORAL | Status: DC

## 2012-11-05 NOTE — ED Provider Notes (Signed)
CSN: [id number]     Arrival date & time 11/05/12  1411 History     First MD Initiated Contact with Patient 11/05/12 1554     Chief Complaint  Patient presents with  . Abscess    on abdomen x 2 wks.    (Consider location/radiation/quality/duration/timing/severity/associated sxs/prior Treatment) Patient is a 47 y.o. female presenting with abscess. The history is provided by the patient.  Abscess Location:  Torso Torso abscess location:  Abd LUQ Abscess quality: fluctuance, induration, painful and warmth   Red streaking: no   Duration:  2 weeks Progression:  Worsening Pain details:    Severity:  Mild   Progression:  Worsening Chronicity:  New Relieved by:  None tried Risk factors: no hx of MRSA and no prior abscess     Past Medical History  Diagnosis Date  . Hypertension   . Osteoarthritis   . Depression   . Gallstones    Past Surgical History  Procedure Laterality Date  . Gallbladder surgery     Family History  Problem Relation Age of Onset  . Coronary artery disease Paternal Grandmother     young age  . Hypertension Father   . Diabetes Sister   . Diabetes Paternal Grandmother    History  Substance Use Topics  . Smoking status: Current Every Day Smoker -- 0.50 packs/day for 1.5 years    Types: Cigarettes  . Smokeless tobacco: Never Used  . Alcohol Use: Yes     Comment: occ   OB History   Grav Para Term Preterm Abortions TAB SAB Ect Mult Living                 Review of Systems  Constitutional: Negative.   Skin: Positive for rash.    Allergies  Azithromycin; Contrast media; and Shellfish allergy  Home Medications   Current Outpatient Rx  Name  Route  Sig  Dispense  Refill  . lisinopril-hydrochlorothiazide (PRINZIDE,ZESTORETIC) 20-25 MG per tablet   Oral   Take 1 tablet by mouth daily.   30 tablet   0   . amoxicillin (AMOXIL) 500 MG capsule   Oral   Take 1,000 mg by mouth 2 (two) times daily.         Marland Kitchen doxycycline (VIBRAMYCIN) 100 MG  capsule   Oral   Take 1 capsule (100 mg total) by mouth 2 (two) times daily.   20 capsule   0   . guaiFENesin-codeine (ROBITUSSIN AC) 100-10 MG/5ML syrup   Oral   Take 5 mLs by mouth 4 (four) times daily as needed.         Marland Kitchen EXPIRED: pantoprazole (PROTONIX) 40 MG tablet   Oral   Take 1 tablet (40 mg total) by mouth daily.   30 tablet   6   . EXPIRED: promethazine (PHENERGAN) 25 MG suppository   Rectal   Place 1 suppository (25 mg total) rectally every 6 (six) hours as needed for nausea.   12 each   1     Please remind pt she is NOT to take Phenergan with ...    BP 148/102  Pulse 77  Temp(Src) 98.3 F (36.8 C) (Oral)  Resp 16  SpO2 100%  LMP 10/21/2012 Physical Exam  Nursing note and vitals reviewed. Constitutional: She is oriented to person, place, and time. She appears well-developed and well-nourished.  Neurological: She is alert and oriented to person, place, and time.  Skin: Skin is warm and dry. Rash noted.  Upper abd abscess, fluctuant.  ED Course   INCISION AND DRAINAGE Date/Time: 11/05/2012 4:23 PM Performed by: Linna Hoff Authorized by: Bradd Canary D Consent: Verbal consent obtained. Consent given by: patient Type: abscess Body area: trunk Local anesthetic: topical anesthetic Patient sedated: no Scalpel size: 11 Incision type: single straight Complexity: simple Drainage: purulent Drainage amount: moderate Wound treatment: wound left open Patient tolerance: Patient tolerated the procedure well with no immediate complications. Comments: Culture obtained.   (including critical care time)  Labs Reviewed - No data to display No results found. 1. Abscess of abdominal wall     MDM    Linna Hoff, MD 11/05/12 [phone]

## 2012-11-05 NOTE — ED Notes (Signed)
Pt reports abscess on abdomen x 2 wks. Gradually getting worse. Area is swollen and tender to touch. Pt states that she used fat back and was not able to induce drainage. Denies fever and any other symptoms.

## 2012-11-09 LAB — CULTURE, ROUTINE-ABSCESS

## 2012-11-09 NOTE — ED Notes (Addendum)
Abscess culture abdomen: Abundant Peptostreptococcus.  No sensitivity. Pt. treated with Doxycycline.  Message to Dr. Artis Flock. Brandy [name] 11/09/2012 Dr. Artis Flock reviewed and said it was OK. Brandy [name] 11/12/2012

## 2013-04-25 ENCOUNTER — Emergency Department (HOSPITAL_COMMUNITY)
Admission: EM | Admit: 2013-04-25 | Discharge: 2013-04-25 | Disposition: A | Discharge status: Home / Self Care | Payer: 59 | Source: Home / Self Care

## 2013-04-25 ENCOUNTER — Encounter (HOSPITAL_COMMUNITY): Payer: Self-pay | Admitting: Emergency Medicine

## 2013-04-25 DIAGNOSIS — I10 Essential (primary) hypertension: Secondary | ICD-10-CM

## 2013-04-25 DIAGNOSIS — J069 Acute upper respiratory infection, unspecified: Secondary | ICD-10-CM

## 2013-04-25 DIAGNOSIS — J45909 Unspecified asthma, uncomplicated: Secondary | ICD-10-CM

## 2013-04-25 LAB — POCT RAPID STREP A: Streptococcus, Group A Screen (Direct): NEGATIVE

## 2013-04-25 MED ORDER — ALBUTEROL SULFATE HFA 108 (90 BASE) MCG/ACT IN AERS: 1.0000 | INHALATION_SPRAY | Freq: Four times a day (QID) | RESPIRATORY_TRACT | Status: DC | PRN

## 2013-04-25 MED ORDER — LISINOPRIL-HYDROCHLOROTHIAZIDE 20-25 MG PO TABS: 1.0000 | ORAL_TABLET | Freq: Every day | ORAL | Status: DC

## 2013-04-25 NOTE — ED Provider Notes (Signed)
Medical screening examination/treatment/procedure(s) were performed by non-physician practitioner and as supervising physician I was immediately available for consultation/collaboration.  Philipp Deputy, M.D.   Harden Mo, MD 04/25/13 [phone]

## 2013-04-25 NOTE — ED Notes (Signed)
C/o cold sx that include cough with mucous, headache, chest discomfort due to cough, sore throat, and nausea that started 2 days ago. Pain is 6/10. Stated that she has taking OTC medications with no relief. B/p is 165/95, stated that she has not had any b/p medications in a long time, need a prescription. Denies any other symptoms. Written by: Lenore Manner, SMA

## 2013-04-25 NOTE — Discharge Instructions (Signed)
Bronchospasm, Adult A bronchospasm is when the tubes that carry air in and out of your lungs (airwarys) spasm or tighten. During a bronchospasm it is hard to breathe. This is because the airways get smaller. A bronchospasm can be triggered by:  Allergies. These may be to animals, pollen, food, or mold.  Infection. This is a common cause of bronchospasm.  Exercise.  Irritants. These include pollution, cigarette smoke, strong odors, aerosol sprays, and paint fumes.  Weather changes.  Stress.  Being emotional. HOME CARE   Always have a plan for getting help. Know when to call your doctor and local emergency services (911 in the [place] Know where you can get emergency care.  Only take medicines as told by your doctor.  If you were prescribed an inhaler or nebulizer machine, ask your doctor how to use it correctly. Always use a spacer with your inhaler if you were given one.  Stay calm during an attack. Try to relax and breathe more slowly.  Control your home environment:  Change your heating and air conditioning filter at least once a month.  Limit your use of fireplaces and wood stoves.  Do not  smoke. Do not  allow smoking in your home.  Avoid perfumes and fragrances.  Get rid of pests (such as roaches and mice) and their droppings.  Throw away plants if you see mold on them.  Keep your house clean and dust free.  Replace carpet with wood, tile, or vinyl flooring. Carpet can trap dander and dust.  Use allergy-proof pillows, mattress covers, and box spring covers.  Wash bed sheets and blankets every week in hot water. Dry them in a dryer.  Use blankets that are made of polyester or cotton.  Wash hands frequently. GET HELP IF:  You have muscle aches.  You have chest pain.  The thick spit you spit or cough up (sputum) changes from clear or white to yellow, green, gray, or bloody.  The thick spit you spit or cough up gets thicker.  There are problems that may be  related to the medicine you are given such as:  A rash.  Itching.  Swelling.  Trouble breathing. GET HELP RIGHT AWAY IF:  You feel you cannot breathe or catch your breath.  You cannot stop coughing.  Your treatment is not helping you breathe better. MAKE SURE YOU:   Understand these instructions.  Will watch your condition.  Will get help right away if you are not doing well or get worse. Document Released: 01/16/2009 Document Revised: 11/21/2012 Document Reviewed: 09/11/2012 [facility] Patient Information 2014 [place].  Upper Respiratory Infection, Adult An upper respiratory infection (URI) is also sometimes known as the common cold. The upper respiratory tract includes the nose, sinuses, throat, trachea, and bronchi. Bronchi are the airways leading to the lungs. Most people improve within 1 week, but symptoms can last up to 2 weeks. A residual cough may last even longer.  CAUSES Many different viruses can infect the tissues lining the upper respiratory tract. The tissues become irritated and inflamed and often become very moist. Mucus production is also common. A cold is contagious. You can easily spread the virus to others by oral contact. This includes kissing, sharing a glass, coughing, or sneezing. Touching your mouth or nose and then touching a surface, which is then touched by another person, can also spread the virus. SYMPTOMS  Symptoms typically develop 1 to 3 days after you come in contact with a cold virus. Symptoms vary  from person to person. They may include:  Runny nose.  Sneezing.  Nasal congestion.  Sinus irritation.  Sore throat.  Loss of voice (laryngitis).  Cough.  Fatigue.  Muscle aches.  Loss of appetite.  Headache.  Low-grade fever. DIAGNOSIS  You might diagnose your own cold based on familiar symptoms, since most people get a cold 2 to 3 times a year. Your caregiver can confirm this based on your exam. Most importantly, your  caregiver can check that your symptoms are not due to another disease such as strep throat, sinusitis, pneumonia, asthma, or epiglottitis. Blood tests, throat tests, and X-rays are not necessary to diagnose a common cold, but they may sometimes be helpful in excluding other more serious diseases. Your caregiver will decide if any further tests are required. RISKS AND COMPLICATIONS  You may be at risk for a more severe case of the common cold if you smoke cigarettes, have chronic heart disease (such as heart failure) or lung disease (such as asthma), or if you have a weakened immune system. The very young and very old are also at risk for more serious infections. Bacterial sinusitis, middle ear infections, and bacterial pneumonia can complicate the common cold. The common cold can worsen asthma and chronic obstructive pulmonary disease (COPD). Sometimes, these complications can require emergency medical care and may be life-threatening. PREVENTION  The best way to protect against getting a cold is to practice good hygiene. Avoid oral or hand contact with people with cold symptoms. Wash your hands often if contact occurs. There is no clear evidence that vitamin C, vitamin E, echinacea, or exercise reduces the chance of developing a cold. However, it is always recommended to get plenty of rest and practice good nutrition. TREATMENT  Treatment is directed at relieving symptoms. There is no cure. Antibiotics are not effective, because the infection is caused by a virus, not by bacteria. Treatment may include:  Increased fluid intake. Sports drinks offer valuable electrolytes, sugars, and fluids.  Breathing heated mist or steam (vaporizer or shower).  Eating chicken soup or other clear broths, and maintaining good nutrition.  Getting plenty of rest.  Using gargles or lozenges for comfort.  Controlling fevers with ibuprofen or acetaminophen as directed by your caregiver.  Increasing usage of your  inhaler if you have asthma. Zinc gel and zinc lozenges, taken in the first 24 hours of the common cold, can shorten the duration and lessen the severity of symptoms. Pain medicines may help with fever, muscle aches, and throat pain. A variety of non-prescription medicines are available to treat congestion and runny nose. Your caregiver can make recommendations and may suggest nasal or lung inhalers for other symptoms.  HOME CARE INSTRUCTIONS   Only take over-the-counter or prescription medicines for pain, discomfort, or fever as directed by your caregiver.  Use a warm mist humidifier or inhale steam from a shower to increase air moisture. This may keep secretions moist and make it easier to breathe.  Drink enough water and fluids to keep your urine clear or pale yellow.  Rest as needed.  Return to work when your temperature has returned to normal or as your caregiver advises. You may need to stay home longer to avoid infecting others. You can also use a face mask and careful hand washing to prevent spread of the virus. SEEK MEDICAL CARE IF:   After the first few days, you feel you are getting worse rather than better.  You need your caregiver's advice about medicines to  control symptoms.  You develop chills, worsening shortness of breath, or brown or red sputum. These may be signs of pneumonia.  You develop yellow or brown nasal discharge or pain in the face, especially when you bend forward. These may be signs of sinusitis.  You develop a fever, swollen neck glands, pain with swallowing, or white areas in the back of your throat. These may be signs of strep throat. SEEK IMMEDIATE MEDICAL CARE IF:   You have a fever.  You develop severe or persistent headache, ear pain, sinus pain, or chest pain.  You develop wheezing, a prolonged cough, cough up blood, or have a change in your usual mucus (if you have chronic lung disease).  You develop sore muscles or a stiff neck. Document  Released: 09/14/2000 Document Revised: 06/13/2011 Document Reviewed: 07/23/2010 [facility] Patient Information 2014 [place], [place].  How to Use an Inhaler Using your inhaler correctly is very important. Good technique will make sure that the medicine reaches your lungs.  HOW TO USE AN INHALER: 1. Take the cap off the inhaler. 2. If this is the first time using your inhaler, you need to prime it. Shake the inhaler for 5 seconds. Release four puffs into the air, away from your face. Ask your doctor for help if you have questions. 3. Shake the inhaler for 5 seconds. 4. Turn the inhaler so the bottle is above the mouthpiece. 5. Put your pointer finger on top of the bottle. Your thumb holds the bottom of the inhaler. 6. Open your mouth. 7. Either hold the inhaler away from your mouth (the width of 2 fingers) or place your lips tightly around the mouthpiece. Ask your doctor which way to use your inhaler. 8. Breathe out as much air as possible. 9. Breathe in and push down on the bottle 1 time to release the medicine. You will feel the medicine go in your mouth and throat. 10. Continue to take a deep breath in very slowly. Try to fill your lungs. 11. After you have breathed in completely, hold your breath for 10 seconds. This will help the medicine to settle in your lungs. If you cannot hold your breath for 10 seconds, hold it for as long as you can before you breathe out. 12. Breathe out slowly, through pursed lips. Whistling is an example of pursed lips. 13. If your doctor has told you to take more than 1 puff, wait at least 15 30 seconds between puffs. This will help you get the best results from your medicine. Do not use the inhaler more than your doctor tells you to. 14. Put the cap back on the inhaler. 15. Follow the directions from your doctor or from the inhaler package about cleaning the inhaler. If you use more than one inhaler, ask your doctor which inhalers to use and what order to use them  in. Ask your doctor to help you figure out when you will need to refill your inhaler.  If you use a steroid inhaler, always rinse your mouth with water after your last puff, gargle and spit out the water. Do not swallow the water. GET HELP IF:  The inhaler medicine only partially helps to stop wheezing or shortness of breath.  You are having trouble using your inhaler.  You have some increase in thick spit (phlegm). GET HELP RIGHT AWAY IF:  The inhaler medicine does not help your wheezing or shortness of breath or you have tightness in your chest.  You have dizziness, headaches, or  fast heart rate.  You have chills, fever, or night sweats.  You have a large increase of thick spit, or your thick spit is bloody. MAKE SURE YOU:   Understand these instructions.  Will watch your condition.  Will get help right away if you are not doing well or get worse. Document Released: 12/29/2007 Document Revised: 01/09/2013 Document Reviewed: 10/18/2012 [facility] Patient Information 2014 [place], [place].  Cough, Adult  A cough is a reflex. It helps you clear your throat and airways. A cough can help heal your body. A cough can last 2 or 3 weeks (acute) or may last more than 8 weeks (chronic). Some common causes of a cough can include an infection, allergy, or a cold. HOME CARE  Only take medicine as told by your doctor.  If given, take your medicines (antibiotics) as told. Finish them even if you start to feel better.  Use a cold steam vaporizer or humidier in your home. This can help loosen thick spit (secretions).  Sleep so you are almost sitting up (semi-upright). Use pillows to do this. This helps reduce coughing.  Rest as needed.  Stop smoking if you smoke. GET HELP RIGHT AWAY IF:  You have yellowish-white fluid (pus) in your thick spit.  Your cough gets worse.  Your medicine does not reduce coughing, and you are losing sleep.  You cough up blood.  You have trouble  breathing.  Your pain gets worse and medicine does not help.  You have a fever. MAKE SURE YOU:   Understand these instructions.  Will watch your condition.  Will get help right away if you are not doing well or get worse. Document Released: 12/02/2010 Document Revised: 06/13/2011 Document Reviewed: 12/02/2010 [facility] Patient Information 2014 [place].

## 2013-04-25 NOTE — ED Notes (Signed)
Spoke to [employer]  Requesting prior authorization-informed we do not do this.  Pharmacy said they would attempt "proair" .  Notified david mabe, np of this conversation and no instructions received

## 2013-04-25 NOTE — ED Provider Notes (Signed)
CSN: [id number]     Arrival date & time 04/25/13  6433 History   First MD Initiated Contact with Patient 04/25/13 [phone]     Chief Complaint  Patient presents with  . URI   (Consider location/radiation/quality/duration/timing/severity/associated sxs/prior Treatment) HPI Comments: 48 year old female who also is a smoker is complaining of a sore throat for one week. Describes it as a burning ache. Also with PMD and ears are itching , st frequent cough.   Past Medical History  Diagnosis Date  . Hypertension   . Osteoarthritis   . Depression   . Gallstones    Past Surgical History  Procedure Laterality Date  . Gallbladder surgery     Family History  Problem Relation Age of Onset  . Coronary artery disease Paternal Grandmother     young age  . Hypertension Father   . Diabetes Sister   . Diabetes Paternal Grandmother    History  Substance Use Topics  . Smoking status: Current Every Day Smoker -- 0.50 packs/day for 1.5 years    Types: Cigarettes  . Smokeless tobacco: Never Used  . Alcohol Use: Yes     Comment: occ   OB History   Grav Para Term Preterm Abortions TAB SAB Ect Mult Living                 Review of Systems  Constitutional: Negative for fever, chills, activity change, appetite change and fatigue.  HENT: Positive for congestion, postnasal drip, rhinorrhea and sore throat. Negative for facial swelling.   Eyes: Negative.   Respiratory: Positive for cough.   Cardiovascular: Negative.   Gastrointestinal: Negative.   Genitourinary: Negative.   Musculoskeletal: Negative for neck pain and neck stiffness.  Skin: Negative for pallor and rash.  Neurological: Negative.     Allergies  Azithromycin; Contrast media; and Shellfish allergy  Home Medications   Current Outpatient Rx  Name  Route  Sig  Dispense  Refill  . albuterol (PROVENTIL HFA;VENTOLIN HFA) 108 (90 BASE) MCG/ACT inhaler   Inhalation   Inhale 1-2 puffs into the lungs every 6 (six) hours as needed for  wheezing or shortness of breath.   1 Inhaler   0   . amoxicillin (AMOXIL) 500 MG capsule   Oral   Take 1,000 mg by mouth 2 (two) times daily.         Marland Kitchen doxycycline (VIBRAMYCIN) 100 MG capsule   Oral   Take 1 capsule (100 mg total) by mouth 2 (two) times daily.   20 capsule   0   . guaiFENesin-codeine (ROBITUSSIN AC) 100-10 MG/5ML syrup   Oral   Take 5 mLs by mouth 4 (four) times daily as needed.         Marland Kitchen lisinopril-hydrochlorothiazide (PRINZIDE,ZESTORETIC) 20-25 MG per tablet   Oral   Take 1 tablet by mouth daily.   30 tablet   0   . lisinopril-hydrochlorothiazide (ZESTORETIC) 20-25 MG per tablet   Oral   Take 1 tablet by mouth daily.   20 tablet   0    BP 165/95  Pulse 84  Temp(Src) 98.9 F (37.2 C) (Oral)  Resp 18  SpO2 97%  LMP 03/29/2013 Physical Exam  Nursing note and vitals reviewed. Constitutional: She is oriented to person, place, and time. She appears well-developed and well-nourished. No distress.  HENT:  Mouth/Throat: No oropharyngeal exudate.  Bilateral TMs are normal Oropharynx with erythema and clear PND.  Eyes: Conjunctivae and EOM are normal.  Neck: Normal range of motion. Neck  supple.  Cardiovascular: Normal rate, regular rhythm and normal heart sounds.   Pulmonary/Chest: Effort normal. No respiratory distress. She has wheezes. She exhibits no tenderness.  Prolonged expiratory phase  Musculoskeletal: Normal range of motion. She exhibits no edema.  Lymphadenopathy:    She has no cervical adenopathy.  Neurological: She is alert and oriented to person, place, and time.  Skin: Skin is warm and dry. No rash noted.  Psychiatric: She has a normal mood and affect.    ED Course  Procedures (including critical care time) Labs Review Labs Reviewed  POCT RAPID STREP A ([facility] CARE ONLY)   Imaging Review No results found. Results for orders placed during the hospital encounter of 04/25/13  POCT RAPID STREP A ([facility] CARE ONLY)      Result  Value Range   Streptococcus, Group A Screen (Direct) NEGATIVE  NEGATIVE      MDM   1. URI (upper respiratory infection)   2. RAD (reactive airway disease) with wheezing   3. HTN (hypertension)     Stop smoking Use the albuterol he can take 2 puffs every 4 hours when necessary cough and wheeze Recommend how to seltzer cold plus nighttime medication for symptoms Plenty of fluids stay well hydrated Followup with her doctor as needed. Zestoretic 20-25 daily. Out of her meds. Assigned PCP as soon as possible for primary care, blood pressure management and lab work.   Janne Napoleon, NP 04/25/13 [phone]

## 2013-04-27 LAB — CULTURE, GROUP A STREP

## 2013-11-20 ENCOUNTER — Other Ambulatory Visit: Payer: Self-pay | Admitting: Orthopedic Surgery

## 2013-11-20 DIAGNOSIS — S43421A Sprain of right rotator cuff capsule, initial encounter: Secondary | ICD-10-CM

## 2013-11-27 ENCOUNTER — Ambulatory Visit
Admission: RE | Admit: 2013-11-27 | Discharge: 2013-11-27 | Disposition: A | Discharge status: Home / Self Care | Payer: 59 | Source: Ambulatory Visit | Attending: Orthopedic Surgery | Admitting: Orthopedic Surgery

## 2013-11-27 DIAGNOSIS — S43421A Sprain of right rotator cuff capsule, initial encounter: Secondary | ICD-10-CM

## 2013-11-27 MED ORDER — GADOBENATE DIMEGLUMINE 529 MG/ML IV SOLN
15.0000 mL | Freq: Once | INTRAVENOUS | Status: AC | PRN
  Administered 2013-11-27: 15 mL via INTRAVENOUS

## 2015-02-01 ENCOUNTER — Inpatient Hospital Stay (HOSPITAL_COMMUNITY)
Admission: EM | Admit: 2015-02-01 | Discharge: 2015-02-05 | DRG: 292 | Disposition: A | Discharge status: Home / Self Care | Payer: 59 | Attending: Internal Medicine | Admitting: Internal Medicine

## 2015-02-01 ENCOUNTER — Emergency Department (HOSPITAL_COMMUNITY): Payer: 59

## 2015-02-01 ENCOUNTER — Encounter (HOSPITAL_COMMUNITY): Payer: Self-pay | Admitting: Family Medicine

## 2015-02-01 DIAGNOSIS — Z91041 Radiographic dye allergy status: Secondary | ICD-10-CM

## 2015-02-01 DIAGNOSIS — R0902 Hypoxemia: Secondary | ICD-10-CM | POA: Diagnosis present

## 2015-02-01 DIAGNOSIS — N179 Acute kidney failure, unspecified: Secondary | ICD-10-CM | POA: Diagnosis present

## 2015-02-01 DIAGNOSIS — I160 Hypertensive urgency: Secondary | ICD-10-CM | POA: Diagnosis present

## 2015-02-01 DIAGNOSIS — Z8249 Family history of ischemic heart disease and other diseases of the circulatory system: Secondary | ICD-10-CM

## 2015-02-01 DIAGNOSIS — I5031 Acute diastolic (congestive) heart failure: Secondary | ICD-10-CM | POA: Diagnosis present

## 2015-02-01 DIAGNOSIS — F329 Major depressive disorder, single episode, unspecified: Secondary | ICD-10-CM | POA: Diagnosis present

## 2015-02-01 DIAGNOSIS — I110 Hypertensive heart disease with heart failure: Principal | ICD-10-CM | POA: Diagnosis present

## 2015-02-01 DIAGNOSIS — I169 Hypertensive crisis, unspecified: Secondary | ICD-10-CM | POA: Diagnosis present

## 2015-02-01 DIAGNOSIS — R9389 Abnormal findings on diagnostic imaging of other specified body structures: Secondary | ICD-10-CM | POA: Insufficient documentation

## 2015-02-01 DIAGNOSIS — R000 Tachycardia, unspecified: Secondary | ICD-10-CM | POA: Diagnosis present

## 2015-02-01 DIAGNOSIS — R739 Hyperglycemia, unspecified: Secondary | ICD-10-CM | POA: Diagnosis present

## 2015-02-01 DIAGNOSIS — R7303 Prediabetes: Secondary | ICD-10-CM | POA: Diagnosis present

## 2015-02-01 DIAGNOSIS — R0602 Shortness of breath: Secondary | ICD-10-CM | POA: Diagnosis not present

## 2015-02-01 DIAGNOSIS — J810 Acute pulmonary edema: Secondary | ICD-10-CM | POA: Diagnosis present

## 2015-02-01 DIAGNOSIS — R0600 Dyspnea, unspecified: Secondary | ICD-10-CM

## 2015-02-01 DIAGNOSIS — M1990 Unspecified osteoarthritis, unspecified site: Secondary | ICD-10-CM | POA: Diagnosis present

## 2015-02-01 DIAGNOSIS — Z8632 Personal history of gestational diabetes: Secondary | ICD-10-CM

## 2015-02-01 DIAGNOSIS — Z833 Family history of diabetes mellitus: Secondary | ICD-10-CM

## 2015-02-01 DIAGNOSIS — F17210 Nicotine dependence, cigarettes, uncomplicated: Secondary | ICD-10-CM | POA: Diagnosis present

## 2015-02-01 DIAGNOSIS — T502X5A Adverse effect of carbonic-anhydrase inhibitors, benzothiadiazides and other diuretics, initial encounter: Secondary | ICD-10-CM | POA: Diagnosis present

## 2015-02-01 DIAGNOSIS — Z9119 Patient's noncompliance with other medical treatment and regimen: Secondary | ICD-10-CM

## 2015-02-01 DIAGNOSIS — Z9114 Patient's other noncompliance with medication regimen: Secondary | ICD-10-CM

## 2015-02-01 NOTE — ED Notes (Signed)
Pt is complaining of intermittent shortness of breath that started this morning around 10:00 this morning. Pt reports having a chest cold for the last 2 weeks. Pt states she is taking Mucinex.

## 2015-02-02 ENCOUNTER — Inpatient Hospital Stay (HOSPITAL_COMMUNITY): Payer: 59

## 2015-02-02 ENCOUNTER — Encounter (HOSPITAL_COMMUNITY): Payer: Self-pay | Admitting: Internal Medicine

## 2015-02-02 DIAGNOSIS — Z8249 Family history of ischemic heart disease and other diseases of the circulatory system: Secondary | ICD-10-CM | POA: Diagnosis not present

## 2015-02-02 DIAGNOSIS — R0602 Shortness of breath: Secondary | ICD-10-CM | POA: Diagnosis present

## 2015-02-02 DIAGNOSIS — Z9119 Patient's noncompliance with other medical treatment and regimen: Secondary | ICD-10-CM | POA: Diagnosis not present

## 2015-02-02 DIAGNOSIS — J810 Acute pulmonary edema: Secondary | ICD-10-CM | POA: Diagnosis present

## 2015-02-02 DIAGNOSIS — R0600 Dyspnea, unspecified: Secondary | ICD-10-CM

## 2015-02-02 DIAGNOSIS — R7303 Prediabetes: Secondary | ICD-10-CM | POA: Diagnosis present

## 2015-02-02 DIAGNOSIS — Z9114 Patient's other noncompliance with medication regimen: Secondary | ICD-10-CM | POA: Diagnosis not present

## 2015-02-02 DIAGNOSIS — I169 Hypertensive crisis, unspecified: Secondary | ICD-10-CM | POA: Diagnosis present

## 2015-02-02 DIAGNOSIS — M1990 Unspecified osteoarthritis, unspecified site: Secondary | ICD-10-CM | POA: Diagnosis present

## 2015-02-02 DIAGNOSIS — Z91041 Radiographic dye allergy status: Secondary | ICD-10-CM | POA: Diagnosis not present

## 2015-02-02 DIAGNOSIS — R938 Abnormal findings on diagnostic imaging of other specified body structures: Secondary | ICD-10-CM | POA: Diagnosis not present

## 2015-02-02 DIAGNOSIS — Z8632 Personal history of gestational diabetes: Secondary | ICD-10-CM | POA: Diagnosis not present

## 2015-02-02 DIAGNOSIS — T502X5A Adverse effect of carbonic-anhydrase inhibitors, benzothiadiazides and other diuretics, initial encounter: Secondary | ICD-10-CM | POA: Diagnosis present

## 2015-02-02 DIAGNOSIS — I110 Hypertensive heart disease with heart failure: Secondary | ICD-10-CM | POA: Diagnosis present

## 2015-02-02 DIAGNOSIS — R000 Tachycardia, unspecified: Secondary | ICD-10-CM | POA: Diagnosis present

## 2015-02-02 DIAGNOSIS — Z833 Family history of diabetes mellitus: Secondary | ICD-10-CM | POA: Diagnosis not present

## 2015-02-02 DIAGNOSIS — I160 Hypertensive urgency: Secondary | ICD-10-CM | POA: Diagnosis not present

## 2015-02-02 DIAGNOSIS — F329 Major depressive disorder, single episode, unspecified: Secondary | ICD-10-CM | POA: Diagnosis present

## 2015-02-02 DIAGNOSIS — R739 Hyperglycemia, unspecified: Secondary | ICD-10-CM | POA: Diagnosis present

## 2015-02-02 DIAGNOSIS — R0902 Hypoxemia: Secondary | ICD-10-CM | POA: Diagnosis present

## 2015-02-02 DIAGNOSIS — N179 Acute kidney failure, unspecified: Secondary | ICD-10-CM | POA: Diagnosis present

## 2015-02-02 DIAGNOSIS — I5031 Acute diastolic (congestive) heart failure: Secondary | ICD-10-CM | POA: Diagnosis present

## 2015-02-02 DIAGNOSIS — F17210 Nicotine dependence, cigarettes, uncomplicated: Secondary | ICD-10-CM | POA: Diagnosis present

## 2015-02-02 LAB — CBC WITH DIFFERENTIAL/PLATELET
Basophils Absolute: 0 10*3/uL (ref 0.0–0.1)
Basophils Relative: 0 %
EOS ABS: 0.1 10*3/uL (ref 0.0–0.7)
Eosinophils Relative: 1 %
HEMATOCRIT: 41 % (ref 36.0–46.0)
HEMOGLOBIN: 13.7 g/dL (ref 12.0–15.0)
LYMPHS ABS: 1.8 10*3/uL (ref 0.7–4.0)
Lymphocytes Relative: 20 %
MCH: 28.2 pg (ref 26.0–34.0)
MCHC: 33.4 g/dL (ref 30.0–36.0)
MCV: 84.5 fL (ref 78.0–100.0)
MONO ABS: 0.3 10*3/uL (ref 0.1–1.0)
MONOS PCT: 3 %
Neutro Abs: 6.8 10*3/uL (ref 1.7–7.7)
Neutrophils Relative %: 76 %
Platelets: 205 10*3/uL (ref 150–400)
RBC: 4.85 MIL/uL (ref 3.87–5.11)
RDW: 14.4 % (ref 11.5–15.5)
WBC: 9.1 10*3/uL (ref 4.0–10.5)

## 2015-02-02 LAB — COMPREHENSIVE METABOLIC PANEL
ALBUMIN: 4 g/dL (ref 3.5–5.0)
ALT: 20 U/L (ref 14–54)
AST: 22 U/L (ref 15–41)
Alkaline Phosphatase: 74 U/L (ref 38–126)
Anion gap: 8 (ref 5–15)
BUN: 14 mg/dL (ref 6–20)
CHLORIDE: 104 mmol/L (ref 101–111)
CO2: 24 mmol/L (ref 22–32)
Calcium: 9.2 mg/dL (ref 8.9–10.3)
Creatinine, Ser: 1 mg/dL (ref 0.44–1.00)
GFR calc Af Amer: >60 mL/min (ref >60)
GFR calc non Af Amer: >60 mL/min (ref >60)
Glucose, Bld: 142 mg/dL — ABNORMAL HIGH (ref 65–99)
Potassium: 3.6 mmol/L (ref 3.5–5.1)
SODIUM: 136 mmol/L (ref 135–145)
Total Bilirubin: 0.7 mg/dL (ref 0.3–1.2)
Total Protein: 7.8 g/dL (ref 6.5–8.1)

## 2015-02-02 LAB — I-STAT TROPONIN, ED
Troponin i, poc: 0.04 ng/mL (ref 0.00–0.08)
Troponin i, poc: 0.03 ng/mL (ref 0.00–0.08)

## 2015-02-02 LAB — BRAIN NATRIURETIC PEPTIDE: B NATRIURETIC PEPTIDE 5: 526.6 pg/mL — AB (ref 0.0–100.0)

## 2015-02-02 LAB — RAPID URINE DRUG SCREEN, HOSP PERFORMED
Amphetamines: NOT DETECTED
BARBITURATES: NOT DETECTED
Benzodiazepines: NOT DETECTED
COCAINE: POSITIVE — AB
Opiates: NOT DETECTED
TETRAHYDROCANNABINOL: POSITIVE — AB

## 2015-02-02 LAB — TROPONIN I
TROPONIN I: 0.04 ng/mL — AB (ref <0.031)
TROPONIN I: 0.04 ng/mL — AB (ref <0.031)
Troponin I: 0.06 ng/mL — ABNORMAL HIGH (ref <0.031)

## 2015-02-02 LAB — MRSA PCR SCREENING: MRSA by PCR: NEGATIVE

## 2015-02-02 LAB — TSH: TSH: 1.942 u[IU]/mL (ref 0.350–4.500)

## 2015-02-02 LAB — D-DIMER, QUANTITATIVE (NOT AT ARMC): D DIMER QUANT: 0.81 ug{FEU}/mL — AB (ref 0.00–0.48)

## 2015-02-02 LAB — T4, FREE: FREE T4: 0.91 ng/dL (ref 0.61–1.12)

## 2015-02-02 MED ORDER — NITROGLYCERIN IN D5W 200-5 MCG/ML-% IV SOLN
0.0000 ug/min | Freq: Once | INTRAVENOUS | Status: AC
Start: 2015-02-02 — End: 2015-02-02
  Administered 2015-02-02: 5 ug/min via INTRAVENOUS
  Filled 2015-02-02: qty 250

## 2015-02-02 MED ORDER — ACETAMINOPHEN 325 MG PO TABS
650.0000 mg | ORAL_TABLET | Freq: Four times a day (QID) | ORAL | Status: DC | PRN
Start: 2015-02-02 — End: 2015-02-05
  Administered 2015-02-02 (×2): 650 mg via ORAL
  Filled 2015-02-02 (×2): qty 2

## 2015-02-02 MED ORDER — FUROSEMIDE 10 MG/ML IJ SOLN
60.0000 mg | Freq: Two times a day (BID) | INTRAMUSCULAR | Status: DC
  Administered 2015-02-02 – 2015-02-03 (×4): 60 mg via INTRAVENOUS
  Filled 2015-02-02 (×4): qty 6

## 2015-02-02 MED ORDER — HYDROCHLOROTHIAZIDE 12.5 MG PO CAPS
12.5000 mg | ORAL_CAPSULE | Freq: Every day | ORAL | Status: DC
  Administered 2015-02-02: 12.5 mg via ORAL
  Filled 2015-02-02: qty 1

## 2015-02-02 MED ORDER — ACETAMINOPHEN 500 MG PO TABS
1000.0000 mg | ORAL_TABLET | Freq: Once | ORAL | Status: AC
  Administered 2015-02-02: 1000 mg via ORAL
  Filled 2015-02-02: qty 2

## 2015-02-02 MED ORDER — LISINOPRIL 20 MG PO TABS
20.0000 mg | ORAL_TABLET | Freq: Every day | ORAL | Status: DC
  Administered 2015-02-03: 20 mg via ORAL
  Filled 2015-02-02: qty 1

## 2015-02-02 MED ORDER — ONDANSETRON HCL 4 MG PO TABS: 4.0000 mg | ORAL_TABLET | Freq: Four times a day (QID) | ORAL | Status: DC | PRN

## 2015-02-02 MED ORDER — CARVEDILOL 3.125 MG PO TABS
3.1250 mg | ORAL_TABLET | Freq: Two times a day (BID) | ORAL | Status: DC
  Administered 2015-02-03: 3.125 mg via ORAL
  Filled 2015-02-02: qty 1

## 2015-02-02 MED ORDER — ACETAMINOPHEN 650 MG RE SUPP: 650.0000 mg | Freq: Four times a day (QID) | RECTAL | Status: DC | PRN

## 2015-02-02 MED ORDER — SODIUM CHLORIDE 0.9 % IJ SOLN
3.0000 mL | Freq: Two times a day (BID) | INTRAMUSCULAR | Status: DC
  Administered 2015-02-02 – 2015-02-03 (×3): 3 mL via INTRAVENOUS

## 2015-02-02 MED ORDER — ASPIRIN EC 81 MG PO TBEC
81.0000 mg | DELAYED_RELEASE_TABLET | Freq: Every day | ORAL | Status: DC
  Administered 2015-02-02 – 2015-02-04 (×3): 81 mg via ORAL
  Filled 2015-02-02 (×4): qty 1

## 2015-02-02 MED ORDER — FUROSEMIDE 10 MG/ML IJ SOLN
INTRAMUSCULAR | Status: AC
  Filled 2015-02-02: qty 8

## 2015-02-02 MED ORDER — LISINOPRIL 10 MG PO TABS
10.0000 mg | ORAL_TABLET | Freq: Every day | ORAL | Status: DC
Start: 2015-02-02 — End: 2015-02-02
  Administered 2015-02-02: 10 mg via ORAL
  Filled 2015-02-02: qty 1

## 2015-02-02 MED ORDER — FUROSEMIDE 10 MG/ML IJ SOLN
60.0000 mg | Freq: Once | INTRAMUSCULAR | Status: AC
  Administered 2015-02-02: 60 mg via INTRAVENOUS

## 2015-02-02 MED ORDER — NITROGLYCERIN IN D5W 200-5 MCG/ML-% IV SOLN
0.0000 ug/min | Freq: Once | INTRAVENOUS | Status: AC
Start: 2015-02-02 — End: 2015-02-02
  Administered 2015-02-02: 70 ug/min via INTRAVENOUS

## 2015-02-02 MED ORDER — ONDANSETRON HCL 4 MG/2ML IJ SOLN: 4.0000 mg | Freq: Four times a day (QID) | INTRAMUSCULAR | Status: DC | PRN

## 2015-02-02 MED ORDER — ENOXAPARIN SODIUM 40 MG/0.4ML ~~LOC~~ SOLN
40.0000 mg | SUBCUTANEOUS | Status: DC
  Administered 2015-02-02 – 2015-02-04 (×3): 40 mg via SUBCUTANEOUS
  Filled 2015-02-02 (×4): qty 0.4

## 2015-02-02 MED ORDER — ATORVASTATIN CALCIUM 40 MG PO TABS
40.0000 mg | ORAL_TABLET | Freq: Every day | ORAL | Status: DC
  Administered 2015-02-02 – 2015-02-04 (×3): 40 mg via ORAL
  Filled 2015-02-02 (×3): qty 1

## 2015-02-02 NOTE — Progress Notes (Signed)
[facility] PROGRESS NOTE    Progress Note   [name] MPN:[id number] DOB: 11/05/1965 DOA: 02/01/2015 PCP: No PCP Per Patient   Brief Narrative:   Brandy [name] is an 49 y.o. female history of hypertension who has not been taking her medications for last few months presents to the ER because of shortness of breath. P  Assessment/Plan:   Acute pulmonary edema (HCC)/ Hypertensive urgency/Hypertensive crisis - Start on IV nitro, lasix and lisinopril. Bp still high. - Titrate lisinopril and cont lasix. - Echo is pending, cardiac biomarker's negative x2, ekg shows sinus tach. - cont asa and statins, FLP pending.    DVT Prophylaxis - Lovenox ordered.  Family Communication: none Disposition Plan: Home when stable. Code Status:     Code Status Orders        Start     Ordered   02/02/15 0600  Full code   Continuous     02/02/15 0600        IV Access:    Peripheral IV   Procedures and diagnostic studies:   Dg Chest 2 View  02/02/2015  CLINICAL DATA:  Shortness of breath for 1 day. History of hypertension, smoker. EXAM: CHEST  2 VIEW COMPARISON:  None. FINDINGS: Cardiac silhouette is mildly enlarged. Mediastinal silhouette is nonsuspicious. Diffuse mild interstitial prominence, faint perihilar nodular densities. No pleural effusion. No pneumothorax. Soft tissue planes and included osseous structure nonsuspicious. Surgical clips in the included right abdomen compatible with cholecystectomy. IMPRESSION: Mild cardiomegaly. Mild interstitial prominence could represent pulmonary edema or atypical infection with perihilar nodular densities, recommend close attention on follow-up imaging. Electronically Signed   By: Elon Alas M.D.   On: 02/02/2015 01:58     Medical Consultants:    None.  Anti-Infectives:   Anti-infectives    None      Subjective:    Brandy [name] SOB of breath improved, she relates she can lay down to  sleep.  Objective:    Filed Vitals:   02/02/15 0624 02/02/15 0629 02/02/15 0630 02/02/15 0645  BP: 170/124  192/120   Pulse:   101   Temp:    98.4 F (36.9 C)  TempSrc:    Oral  Resp:   16   Height:  5' (1.524 m)    Weight:  73.5 kg (162 lb 0.6 oz)    SpO2:   99%     Intake/Output Summary (Last 24 hours) at 02/02/15 0703 Last data filed at 02/02/15 0659  Gross per 24 hour  Intake  223.4 ml  Output      0 ml  Net  223.4 ml   Filed Weights   02/01/15 2332 02/02/15 0629  Weight: 75.751 kg (167 lb) 73.5 kg (162 lb 0.6 oz)    Exam: Gen:  NAD Cardiovascular:  RRR, No M/R/G + JVD Chest and lungs:   Good air movement CTA B/L Abdomen:  Abdomen soft, NT/ND, + BS Extremities:  No C/E/C   Data Reviewed:    Labs: Basic Metabolic Panel:  Recent Labs Lab 02/02/15 0259  NA 136  K 3.6  CL 104  CO2 24  GLUCOSE 142*  BUN 14  CREATININE 1.00  CALCIUM 9.2   GFR Estimated Creatinine Clearance: 60.9 mL/min (by C-G formula based on Cr of 1). Liver Function Tests:  Recent Labs Lab 02/02/15 0259  AST 22  ALT 20  ALKPHOS 74  BILITOT 0.7  PROT 7.8  ALBUMIN 4.0   No results for input(s): LIPASE,  AMYLASE in the last 168 hours. No results for input(s): AMMONIA in the last 168 hours. Coagulation profile No results for input(s): INR, PROTIME in the last 168 hours.  CBC:  Recent Labs Lab 02/02/15 0259  WBC 9.1  NEUTROABS 6.8  HGB 13.7  HCT 41.0  MCV 84.5  PLT 205   Cardiac Enzymes: No results for input(s): CKTOTAL, CKMB, CKMBINDEX, TROPONINI in the last 168 hours. BNP (last 3 results) No results for input(s): PROBNP in the last 8760 hours. CBG: No results for input(s): GLUCAP in the last 168 hours. D-Dimer:  Recent Labs  02/02/15 0625  DDIMER 0.81*   Hgb A1c: No results for input(s): HGBA1C in the last 72 hours. Lipid Profile: No results for input(s): CHOL, HDL, LDLCALC, TRIG, CHOLHDL, LDLDIRECT in the last 72 hours. Thyroid function studies: No  results for input(s): TSH, T4TOTAL, T3FREE, THYROIDAB in the last 72 hours.  Invalid input(s): FREET3 Anemia work up: No results for input(s): VITAMINB12, FOLATE, FERRITIN, TIBC, IRON, RETICCTPCT in the last 72 hours. Sepsis Labs:  Recent Labs Lab 02/02/15 0259  WBC 9.1   Microbiology No results found for this or any previous visit (from the past 240 hour(s)).   Medications:   . enoxaparin (LOVENOX) injection  40 mg Subcutaneous Q24H  . hydrochlorothiazide  12.5 mg Oral Daily  . lisinopril  10 mg Oral Daily  . nitroGLYCERIN  0-200 mcg/min Intravenous Once  . sodium chloride  3 mL Intravenous Q12H   Continuous Infusions:   Time spent: 25 min   LOS: 0 days   Charlynne Cousins  [facility] Pager [phone]  *Please refer to [email or web address], password [email or web address] to get updated schedule on who will round on this patient, as hospitalists switch teams weekly. If 7PM-7AM, please contact night-coverage at [email or web address], password [email or web address] for any overnight needs.  02/02/2015, 7:03 AM

## 2015-02-02 NOTE — Progress Notes (Signed)
  Echocardiogram 2D Echocardiogram has been performed.  Darlina Sicilian M 02/02/2015, 1:24 PM

## 2015-02-02 NOTE — ED Provider Notes (Signed)
CSN: [id number]     Arrival date & time 02/01/15  2305 History  By signing my name below, I, Eustaquio Maize, attest that this documentation has been prepared under the direction and in the presence of Rolland Porter, MD at [phone]. Electronically Signed: Eustaquio Maize, ED Scribe. 02/02/2015.3:23 AM.    Chief Complaint  Patient presents with  . Shortness of Breath   The history is provided by the patient. No language interpreter was used.     HPI Comments: Brandy [name] is a 49 y.o. female who presents to the Emergency Department complaining of gradual onset, constant, shortness of breath and productive cough with white phlegm that began a few weeks ago but got worse yesterday morning. Pt also complains of wheezing, chest tightness, and clear rhinorrhea. She states she notices some wheezing mainly at night when she tries to lay down. She attributes the chest tightness to worrying about her oxygen saturation dropping. Pt has been taking Mucinex and staying hydrated without relief. Pt has hx of bronchitis and states she usually gets it about twice per day. She states she has used inhalers in the past. She has never had to be admitted for respiratory issues in the past. Denies fever, sore throat, chest pain, or any other associated symptoms. Pt is current every day smoker. She admits to being out of Lisinopril for the last year.   PCP - None  Past Medical History  Diagnosis Date  . Hypertension   . Osteoarthritis   . Depression   . Gallstones    Past Surgical History  Procedure Laterality Date  . Gallbladder surgery     Family History  Problem Relation Age of Onset  . Coronary artery disease Paternal Grandmother     young age  . Hypertension Father   . Diabetes Sister   . Diabetes Paternal Grandmother    Social History  Substance Use Topics  . Smoking status: Current Every Day Smoker -- 0.50 packs/day for 1.5 years    Types: Cigarettes  . Smokeless tobacco: Never Used  . Alcohol  Use: Yes     Comment: Weekends.    employed  OB History    No data available     Review of Systems  Constitutional: Negative for fever and chills.  HENT: Positive for rhinorrhea. Negative for sore throat.   Respiratory: Positive for cough, chest tightness, shortness of breath and wheezing.   Cardiovascular: Negative for chest pain and leg swelling.   Allergies  Azithromycin; Contrast media; and Shellfish allergy  Home Medications   Prior to Admission medications   Medication Sig Start Date End Date Taking? Authorizing Provider  guaiFENesin (MUCINEX) 600 MG 12 hr tablet Take 600 mg by mouth 2 (two) times daily as needed (for cold symptoms).   Yes Historical Provider, MD   Triage Vitals: BP 195/130 mmHg  Pulse 105  Temp(Src) 98.3 F (36.8 C) (Oral)  Resp 20  Ht 5' (1.524 m)  Wt 167 lb (75.751 kg)  BMI 32.62 kg/m2  SpO2 98%  LMP 01/28/2015   Vital signs normal hypertension, tachycardia   Physical Exam  Constitutional: She is oriented to person, place, and time. She appears well-developed and well-nourished.  Non-toxic appearance. She does not appear ill. No distress.  HENT:  Head: Normocephalic and atraumatic.  Right Ear: External ear normal.  Left Ear: External ear normal.  Nose: Nose normal. No mucosal edema or rhinorrhea.  Mouth/Throat: Oropharynx is clear and moist and mucous membranes are normal. No dental abscesses  or uvula swelling.  Eyes: Conjunctivae and EOM are normal. Pupils are equal, round, and reactive to light.  Neck: Normal range of motion and full passive range of motion without pain. Neck supple.  Cardiovascular: Normal rate, regular rhythm and normal heart sounds.  Exam reveals no gallop and no friction rub.   No murmur heard. Pulmonary/Chest: Effort normal. No respiratory distress. She has no wheezes. She has no rhonchi. She has no rales. She exhibits no tenderness and no crepitus.  Breath sounds diminished  Abdominal: Soft. Normal appearance and  bowel sounds are normal. She exhibits no distension. There is no tenderness. There is no rebound and no guarding.  Musculoskeletal: Normal range of motion. She exhibits no edema or tenderness.  Moves all extremities well.   Neurological: She is alert and oriented to person, place, and time. She has normal strength. No cranial nerve deficit.  Skin: Skin is warm, dry and intact. No rash noted. No erythema. No pallor.  Psychiatric: She has a normal mood and affect. Her speech is normal and behavior is normal. Her mood appears not anxious.  Nursing note and vitals reviewed.   ED Course  Procedures (including critical care time)  Medications  acetaminophen (TYLENOL) tablet 1,000 mg (1,000 mg Oral Given 02/02/15 0129)  nitroGLYCERIN 50 mg in dextrose 5 % 250 mL (0.2 mg/mL) infusion (65 mcg/min Intravenous Rate/Dose Change 02/02/15 0455)  furosemide (LASIX) injection 60 mg (60 mg Intravenous Given 02/02/15 0111)     DIAGNOSTIC STUDIES: Oxygen Saturation is 98% on RA, normal by my interpretation.    COORDINATION OF CARE: 12:54 AM-Discussed treatment plan which includes CXR. Troponin, BNP, Lasix, and NTG drip with pt at bedside and pt agreed to plan.  Patient has extremely high blood pressure and she has not been taking her medications for 2 years. She presents with dyspnea and when I reviewed her chest x-ray she has cardiomegaly and increased pulmonary vascularity. I am concerned patient may have hypertensive crisis. She was started on a nitroglycerin drip to control her blood pressure and to help with her presumed congestive heart failure, she was also given Lasix IV.  3:20 AM - Discussed results with pt, including slightly elevated BNP and CXR findings. Advised admitting pt to the hospital to get her blood pressure regulated and observe her overnight. Pt understands and agrees with this plan. Her blood pressure at this time is 148/100. She mentions when she walked to the bathroom she got  lightheaded.  04:25 Dr Hal Hope, admit to step down  Labs Review Results for orders placed or performed during the hospital encounter of 02/01/15  Brain natriuretic peptide  Result Value Ref Range   B Natriuretic Peptide 526.6 (H) 0.0 - 100.0 pg/mL  Comprehensive metabolic panel  Result Value Ref Range   Sodium 136 135 - 145 mmol/L   Potassium 3.6 3.5 - 5.1 mmol/L   Chloride 104 101 - 111 mmol/L   CO2 24 22 - 32 mmol/L   Glucose, Bld 142 (H) 65 - 99 mg/dL   BUN 14 6 - 20 mg/dL   Creatinine, Ser 1.00 0.44 - 1.00 mg/dL   Calcium 9.2 8.9 - 10.3 mg/dL   Total Protein 7.8 6.5 - 8.1 g/dL   Albumin 4.0 3.5 - 5.0 g/dL   AST 22 15 - 41 U/L   ALT 20 14 - 54 U/L   Alkaline Phosphatase 74 38 - 126 U/L   Total Bilirubin 0.7 0.3 - 1.2 mg/dL   GFR calc non Af Amer >  60 >60 mL/min   GFR calc Af Amer >60 >60 mL/min   Anion gap 8 5 - 15  CBC with Differential  Result Value Ref Range   WBC 9.1 4.0 - 10.5 K/uL   RBC 4.85 3.87 - 5.11 MIL/uL   Hemoglobin 13.7 12.0 - 15.0 g/dL   HCT 41.0 36.0 - 46.0 %   MCV 84.5 78.0 - 100.0 fL   MCH 28.2 26.0 - 34.0 pg   MCHC 33.4 30.0 - 36.0 g/dL   RDW 14.4 11.5 - 15.5 %   Platelets 205 150 - 400 K/uL   Neutrophils Relative % 76 %   Neutro Abs 6.8 1.7 - 7.7 K/uL   Lymphocytes Relative 20 %   Lymphs Abs 1.8 0.7 - 4.0 K/uL   Monocytes Relative 3 %   Monocytes Absolute 0.3 0.1 - 1.0 K/uL   Eosinophils Relative 1 %   Eosinophils Absolute 0.1 0.0 - 0.7 K/uL   Basophils Relative 0 %   Basophils Absolute 0.0 0.0 - 0.1 K/uL  I-stat troponin, ED  Result Value Ref Range   Troponin i, poc 0.04 0.00 - 0.08 ng/mL   Comment 3          I-stat troponin, ED  Result Value Ref Range   Troponin i, poc 0.03 0.00 - 0.08 ng/mL   Comment 3            Laboratory interpretation all normal except mildly elevated BMP    Imaging Review Dg Chest 2 View  02/02/2015  CLINICAL DATA:  Shortness of breath for 1 day. History of hypertension, smoker. EXAM: CHEST  2 VIEW  COMPARISON:  None. FINDINGS: Cardiac silhouette is mildly enlarged. Mediastinal silhouette is nonsuspicious. Diffuse mild interstitial prominence, faint perihilar nodular densities. No pleural effusion. No pneumothorax. Soft tissue planes and included osseous structure nonsuspicious. Surgical clips in the included right abdomen compatible with cholecystectomy. IMPRESSION: Mild cardiomegaly. Mild interstitial prominence could represent pulmonary edema or atypical infection with perihilar nodular densities, recommend close attention on follow-up imaging. Electronically Signed   By: Elon Alas M.D.   On: 02/02/2015 01:58   I have personally reviewed and evaluated these images and lab results as part of my medical decision-making.   EKG Interpretation   Date/Time:  Sunday February 01 2015 23:45:58 EDT Ventricular Rate:  116 PR Interval:  134 QRS Duration: 85 QT Interval:  358 QTC Calculation: 497 R Axis:   23 Text Interpretation:  Sinus tachycardia Multiple premature complexes, vent  & supraven Probable left atrial enlargement Probable LVH with secondary  repol abnrm Borderline prolonged QT interval Since last tracing rate  faster 25 Jul 2005 Confirmed by Kymora Sciara  MD-I, Valaria Kohut ([id number]) on 02/02/2015  12:09:19 AM      MDM   Final diagnoses:  Hypertensive crisis  Dyspnea   Plan admission  Rolland Porter, MD, [place] Performed by: Janice Norrie Total critical care time: 35  minutes Critical care time was exclusive of separately billable procedures and treating other patients. Critical care was necessary to treat or prevent imminent or life-threatening deterioration. Critical care was time spent personally by me on the following activities: development of treatment plan with patient and/or surrogate as well as nursing, discussions with consultants, evaluation of patient's response to treatment, examination of patient, obtaining history from patient or surrogate, ordering and  performing treatments and interventions, ordering and review of laboratory studies, ordering and review of radiographic studies, pulse oximetry and re-evaluation of patient's condition.  I personally performed the services described in this documentation, which was scribed in my presence. The recorded information has been reviewed and considered.  Rolland Porter, MD, Barbette Or, MD 02/02/15 [phone]

## 2015-02-02 NOTE — Care Management Note (Signed)
Case Management Note  Patient Details  Name: Brandy [name] MRN: [id number] Date of Birth: 18-Mar-1966  Subjective/Objective:               Hypertensive crisis and dyspnea     Action/Plan:Date: February 02, 2015 Chart reviewed for concurrent status and case management needs. Will continue to follow patient for changes and needs: Velva Harman, RN, BSN, [place]   [phone]   Expected Discharge Date:                  Expected Discharge Plan:  Home/Self Care  In-House Referral:  NA  Discharge planning Services  CM Consult  Post Acute Care Choice:  NA Choice offered to:  NA  DME Arranged:    DME Agency:     HH Arranged:    HH Agency:     Status of Service:  In process, will continue to follow  Medicare Important Message Given:    Date Medicare IM Given:    Medicare IM give by:    Date Additional Medicare IM Given:    Additional Medicare Important Message give by:     If discussed at [place] of Stay Meetings, dates discussed:    Additional Comments:  Leeroy Cha, RN 02/02/2015, 10:53 AM

## 2015-02-02 NOTE — H&P (Signed)
[facility] History and Physical  Brandy [name] ZHY:[id number] DOB: 17-Mar-1966 DOA: 02/01/2015  Referring physician: Juliane Poot. PCP: No PCP Per Patient  Specialists: None.  Chief Complaint: Shortness of breath.  HPI: Brandy [name] is a 49 y.o. female with history of hypertension who has not been taking her medications for last few months presents to the ER because of shortness of breath. Patient states 2 weeks ago she had upper respiratory tract infection-like symptoms and had taken some over-the-counter medications. Over the last 2 weeks patient has been having increasing shortness of breath acutely on lying down. Denies any chest pain but did experience some palpitations. In the ER patient was found to have elevated blood pressure more than 295 systolic and chest x-ray was showing possible pulmonary edema with cardiomegaly. BNP was elevated. Patient was given Lasix 60 mg IV and placed on nitroglycerin infusion. Patient has mild frontal headache after starting Lantus insulin infusion. On exam patient is nonfocal. Patient will be admitted for acute pulmonary edema with hypertensive urgency.   Review of Systems: As presented in the history of presenting illness, rest negative.  Past Medical History  Diagnosis Date  . Hypertension   . Osteoarthritis   . Depression   . Gallstones    Past Surgical History  Procedure Laterality Date  . Gallbladder surgery     Social History:  reports that she has been smoking Cigarettes.  She has a .75 pack-year smoking history. She has never used smokeless tobacco. She reports that she drinks alcohol. She reports that she does not use illicit drugs. Where does patient live home. Can patient participate in ADLs? Yes.  Allergies  Allergen Reactions  . Azithromycin Other (See Comments)    "Stomach cramps"  . Contrast Media [Iodinated Diagnostic Agents] Hives  . 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 Coronary artery disease Paternal Grandmother     young age  . Hypertension Father   . Diabetes Sister   . Diabetes Paternal Grandmother       Prior to Admission medications   Medication Sig Start Date End Date Taking? Authorizing Provider  guaiFENesin (MUCINEX) 600 MG 12 hr tablet Take 600 mg by mouth 2 (two) times daily as needed (for cold symptoms).   Yes Historical Provider, MD    Physical Exam: Filed Vitals:   02/02/15 0527 02/02/15 0528 02/02/15 0540 02/02/15 0545  BP: 165/139 165/139 189/126 189/116  Pulse:   99 104  Temp:      TempSrc:      Resp: 17 15 18 20   Height:      Weight:      SpO2:   98% 99%     General:  Moderately built and nourished.  Eyes: Anicteric no pallor.  ENT: No discharge from the ears eyes nose and mouth.  Neck: JVD is elevated. No mass felt.  Cardiovascular: S1-S2 heard.  Respiratory: No rhonchi or crepitations.  Abdomen: Soft nontender bowel sounds present.  Skin: No rash.  Musculoskeletal: No edema.  Psychiatric: Appears normal.  Neurologic: Alert awake oriented to time place and person. Moves all extremities.  Labs on Admission:  Basic Metabolic Panel:  Recent Labs Lab 02/02/15 0259  NA 136  K 3.6  CL 104  CO2 24  GLUCOSE 142*  BUN 14  CREATININE 1.00  CALCIUM 9.2   Liver Function Tests:  Recent Labs Lab  02/02/15 0259  AST 22  ALT 20  ALKPHOS 74  BILITOT 0.7  PROT 7.8  ALBUMIN 4.0   No results for input(s): LIPASE, AMYLASE in the last 168 hours. No results for input(s): AMMONIA in the last 168 hours. CBC:  Recent Labs Lab 02/02/15 0259  WBC 9.1  NEUTROABS 6.8  HGB 13.7  HCT 41.0  MCV 84.5  PLT 205   Cardiac Enzymes: No results for input(s): CKTOTAL, CKMB, CKMBINDEX, TROPONINI in the last 168 hours.  BNP (last 3 results)  Recent Labs  02/02/15 0125  BNP 526.6*    ProBNP (last 3 results) No results for input(s):  PROBNP in the last 8760 hours.  CBG: No results for input(s): GLUCAP in the last 168 hours.  Radiological Exams on Admission: Dg Chest 2 View  02/02/2015  CLINICAL DATA:  Shortness of breath for 1 day. History of hypertension, smoker. EXAM: CHEST  2 VIEW COMPARISON:  None. FINDINGS: Cardiac silhouette is mildly enlarged. Mediastinal silhouette is nonsuspicious. Diffuse mild interstitial prominence, faint perihilar nodular densities. No pleural effusion. No pneumothorax. Soft tissue planes and included osseous structure nonsuspicious. Surgical clips in the included right abdomen compatible with cholecystectomy. IMPRESSION: Mild cardiomegaly. Mild interstitial prominence could represent pulmonary edema or atypical infection with perihilar nodular densities, recommend close attention on follow-up imaging. Electronically Signed   By: Elon Alas M.D.   On: 02/02/2015 01:58    EKG: Independently reviewed. Sinus tachycardia.  Assessment/Plan Principal Problem:   Acute pulmonary edema (HCC) Active Problems:   Hypoxia   Hypertensive urgency   Hypertensive crisis   1. Acute pulmonary edema - probably secondary to uncontrolled blood pressure. Patient was started on IV nitroglycerin infusion. Patient used to previously been on lisinopril and hydrochlorothiazide which I have restarted. Slowly increase dose as tolerated. Cycle cardiac markers and check 2-D echo. Check urine drug screen. Since patient is persistently tachycardic I also ordered thyroid function test and d-dimer. 2. Hypertensive urgency - probably secondary to noncompliance. Patient has been restarted on lisinopril and hydrochlorothiazide which has to be titrated up based on the response. 3. Perihilar nodular densities - given the history of cigarette smoking I have ordered CT chest. Patient is allergic to contrast. 4. Tobacco abuse - tobacco cessation counseling requested.  I have personally reviewed patient's chest x-ray and  EKG.   DVT Prophylaxis Lovenox.  Code Status: Full code.  Family Communication: Discussed with patient.  Disposition Plan: Admit to inpatient.    Mung Rinker N. [facility] Pager [phone].  If 7PM-7AM, please contact night-coverage [email or web address] Password [email or web address] 02/02/2015, 6:01 AM

## 2015-02-03 LAB — BASIC METABOLIC PANEL
ANION GAP: 11 (ref 5–15)
BUN: 21 mg/dL — ABNORMAL HIGH (ref 6–20)
CHLORIDE: 98 mmol/L — AB (ref 101–111)
CO2: 29 mmol/L (ref 22–32)
Calcium: 9.6 mg/dL (ref 8.9–10.3)
Creatinine, Ser: 1.61 mg/dL — ABNORMAL HIGH (ref 0.44–1.00)
GFR calc Af Amer: 42 mL/min — ABNORMAL LOW (ref >60)
GFR, EST NON AFRICAN AMERICAN: 37 mL/min — AB (ref >60)
GLUCOSE: 115 mg/dL — AB (ref 65–99)
POTASSIUM: 3.1 mmol/L — AB (ref 3.5–5.1)
Sodium: 138 mmol/L (ref 135–145)

## 2015-02-03 LAB — CBC
HEMATOCRIT: 41.7 % (ref 36.0–46.0)
HEMOGLOBIN: 13.7 g/dL (ref 12.0–15.0)
MCH: 28.2 pg (ref 26.0–34.0)
MCHC: 32.9 g/dL (ref 30.0–36.0)
MCV: 85.8 fL (ref 78.0–100.0)
Platelets: 223 10*3/uL (ref 150–400)
RBC: 4.86 MIL/uL (ref 3.87–5.11)
RDW: 14.6 % (ref 11.5–15.5)
WBC: 5.8 10*3/uL (ref 4.0–10.5)

## 2015-02-03 LAB — LIPID PANEL
Cholesterol: 176 mg/dL (ref 0–200)
HDL: 56 mg/dL (ref >40)
LDL CALC: 96 mg/dL (ref 0–99)
TRIGLYCERIDES: 121 mg/dL (ref <150)
Total CHOL/HDL Ratio: 3.1 RATIO
VLDL: 24 mg/dL (ref 0–40)

## 2015-02-03 LAB — T3, FREE: T3 FREE: 3.1 pg/mL (ref 2.0–4.4)

## 2015-02-03 MED ORDER — CARVEDILOL 6.25 MG PO TABS
6.2500 mg | ORAL_TABLET | Freq: Two times a day (BID) | ORAL | Status: DC
  Administered 2015-02-03 – 2015-02-05 (×4): 6.25 mg via ORAL
  Filled 2015-02-03 (×4): qty 1

## 2015-02-03 MED ORDER — DOCUSATE SODIUM 100 MG PO CAPS
200.0000 mg | ORAL_CAPSULE | Freq: Every day | ORAL | Status: DC
  Administered 2015-02-03 – 2015-02-04 (×2): 200 mg via ORAL
  Filled 2015-02-03 (×2): qty 2

## 2015-02-03 NOTE — Progress Notes (Signed)
[facility] PROGRESS NOTE  Brandy [name] NGE:[id number] DOB: 05/31/1965 DOA: 02/01/2015 PCP: No PCP Per Patient  Assessment/Plan: Acute pulmonary edema/Hypertensive urgency/Hypertensive crisis: Problably secondary to medication non-compliance.  Presented to ED with SOB and BP of 195/130, patient admits she has been off her medications for several years. Current smoker.  Cardiac biomarkers negative x 2, ekg showed sinus tach. Lipid panel demonstrated no abnormality. Started on IV nitro, lasix and lisinopril.  BP now stable, no longer fluid overloaded, d/c Lasix for 24 hours.  Continue aspirin and statins  Acute diastolic heart failure:  patient presented with acute dyspnea and pulmonary edema. Echo shows grade 1 diastolic function, left ventricular ejection fraction 50%-55%, left ventricle mildly enlarged.  Acute renal failure:  Creatinine elevated today, hold lasix, 1.61. GFR 37 . Repeat in the AM.  Code Status: full code Family Communication: at bedside Disposition Plan: home when stable   Consultants:  none  Procedures:  none  Antibiotics:  See below  HPI/Subjective: Brandy [name] is a 49 y.o. female with a history of hypertension who has not been taking her medications for several years who presented to the ED on 02/01/2015 with complaint of shortness of breath . The patient is a current smoker. She states she tried OTC medications for her cough but that she had increasing shortness of breath over the past 2 weeks. She was found to have an elevated blood pressure and chest x-ray showed possible pulmonary edema.The patient was admitted for acute pulmonary edema with hypertensive urgency and placed on IV Lasix , nitro and lisinopril. TTE showed grade 1 diastolic heart failure with a left ventricular ejection fraction of 50%-55%. The patient has shown significant improvement in her symptoms over the past 24 hours.  Objective: Filed Vitals:   02/03/15 0602   BP: 133/96  Pulse: 91  Temp: 97.9 F (36.6 C)  Resp: 20    Intake/Output Summary (Last 24 hours) at 02/03/15 1013 Last data filed at 02/03/15 0900  Gross per 24 hour  Intake   1200 ml  Output   1350 ml  Net   -150 ml   Filed Weights   02/02/15 0629 02/02/15 1509 02/03/15 0602  Weight: 73.5 kg (162 lb 0.6 oz) 79.833 kg (176 lb) 72.9 kg (160 lb 11.5 oz)    Exam:   General: Moderately built and nourished.  Eyes: Anicteric no pallor.  ENT: No discharge from the ears eyes nose and mouth.  Neck: JVD is elevated. No mass felt.  Cardiovascular: S1-S2 heard.  Respiratory: No rhonchi or crepitations.  Abdomen: Soft nontender bowel sounds present.  Skin: No rash.  Musculoskeletal: No edema.  Psychiatric: Appears normal.  Neurologic: Alert awake oriented to time place and person. Moves all extremities.   Data Reviewed: Basic Metabolic Panel:  Recent Labs Lab 02/02/15 0259 02/03/15 0420  NA 136 138  K 3.6 3.1*  CL 104 98*  CO2 24 29  GLUCOSE 142* 115*  BUN 14 21*  CREATININE 1.00 1.61*  CALCIUM 9.2 9.6   Liver Function Tests:  Recent Labs Lab 02/02/15 0259  AST 22  ALT 20  ALKPHOS 74  BILITOT 0.7  PROT 7.8  ALBUMIN 4.0   No results for input(s): LIPASE, AMYLASE in the last 168 hours. No results for input(s): AMMONIA in the last 168 hours. CBC:  Recent Labs Lab 02/02/15 0259 02/03/15 0420  WBC 9.1 5.8  NEUTROABS 6.8  --   HGB 13.7 13.7  HCT 41.0 41.7  MCV 84.5 85.8  PLT 205 223   Cardiac Enzymes:  Recent Labs Lab 02/02/15 0625 02/02/15 1130 02/02/15 1840  TROPONINI 0.04* 0.04* 0.06*   BNP (last 3 results)  Recent Labs  02/02/15 0125  BNP 526.6*    ProBNP (last 3 results) No results for input(s): PROBNP in the last 8760 hours.  CBG: No results for input(s): GLUCAP in the last 168 hours.  Recent Results (from the past 240 hour(s))  MRSA PCR Screening     Status: None   Collection Time: 02/02/15  6:00 AM  Result Value  Ref Range Status   MRSA by PCR NEGATIVE NEGATIVE Final    Comment:        The GeneXpert MRSA Assay (FDA approved for NASAL specimens only), is one component of a comprehensive MRSA colonization surveillance program. It is not intended to diagnose MRSA infection nor to guide or monitor treatment for MRSA infections.      Studies: Dg Chest 2 View  02/02/2015  CLINICAL DATA:  Shortness of breath for 1 day. History of hypertension, smoker. EXAM: CHEST  2 VIEW COMPARISON:  None. FINDINGS: Cardiac silhouette is mildly enlarged. Mediastinal silhouette is nonsuspicious. Diffuse mild interstitial prominence, faint perihilar nodular densities. No pleural effusion. No pneumothorax. Soft tissue planes and included osseous structure nonsuspicious. Surgical clips in the included right abdomen compatible with cholecystectomy. IMPRESSION: Mild cardiomegaly. Mild interstitial prominence could represent pulmonary edema or atypical infection with perihilar nodular densities, recommend close attention on follow-up imaging. Electronically Signed   By: Elon Alas M.D.   On: 02/02/2015 01:58   Ct Chest Wo Contrast  02/02/2015  CLINICAL DATA:  Increasing shortness of breath for 2 weeks when lying down. Abnormal chest radiograph. EXAM: CT CHEST WITHOUT CONTRAST TECHNIQUE: Multidetector CT imaging of the chest was performed following the standard protocol without IV contrast. COMPARISON:  Chest radiograph 02/01/1959. FINDINGS: Mediastinum/Nodes: Mediastinal lymph nodes are not enlarged by CT size criteria. Hilar regions are difficult to definitively evaluate without IV contrast. No axillary adenopathy. Pulmonary arteries are enlarged. Heart size normal. Small pericardial effusion. Lungs/Pleura: Minimal centrilobular emphysema. No septal thickening for airspace consolidation. Tiny bilateral effusions with minimal compressive atelectasis at both lung bases. Airway is unremarkable. Upper abdomen: Visualized portions  of the liver, kidneys, spleen pancreas, stomach and bowel are grossly unremarkable. Cholecystectomy. Musculoskeletal: No worrisome lytic or sclerotic lesions. Degenerative changes are seen in the spine. IMPRESSION: 1. Small pericardial effusion. 2. Tiny bilateral pleural effusions with minimal compressive atelectasis in both lower lobes. 3. Enlarged pulmonary arteries, indicative of pulmonary arterial hypertension. Electronically Signed   By: Lorin Picket M.D.   On: 02/02/2015 08:25    Scheduled Meds: . aspirin EC  81 mg Oral Daily  . atorvastatin  40 mg Oral q1800  . carvedilol  6.25 mg Oral BID WC  . enoxaparin (LOVENOX) injection  40 mg Subcutaneous Q24H  . furosemide  60 mg Intravenous BID  . lisinopril  20 mg Oral Daily  . sodium chloride  3 mL Intravenous Q12H   Continuous Infusions:   Principal Problem:   Acute pulmonary edema (HCC) Active Problems:   Hypoxia   Hypertensive urgency   Hypertensive crisis    Time spent: 15 min    Lawernce Keas  PA-S [facility]  If 7PM-7AM, please contact night-coverage at [email or web address], password [email or web address] 02/03/2015, 10:13 AM  LOS: 1 day

## 2015-02-04 DIAGNOSIS — I169 Hypertensive crisis, unspecified: Secondary | ICD-10-CM

## 2015-02-04 DIAGNOSIS — I160 Hypertensive urgency: Secondary | ICD-10-CM

## 2015-02-04 DIAGNOSIS — J810 Acute pulmonary edema: Secondary | ICD-10-CM

## 2015-02-04 LAB — BASIC METABOLIC PANEL
Anion gap: 8 (ref 5–15)
BUN: 21 mg/dL — AB (ref 6–20)
CALCIUM: 9.4 mg/dL (ref 8.9–10.3)
CO2: 29 mmol/L (ref 22–32)
CREATININE: 1.44 mg/dL — AB (ref 0.44–1.00)
Chloride: 101 mmol/L (ref 101–111)
GFR calc non Af Amer: 42 mL/min — ABNORMAL LOW (ref >60)
GFR, EST AFRICAN AMERICAN: 49 mL/min — AB (ref >60)
Glucose, Bld: 107 mg/dL — ABNORMAL HIGH (ref 65–99)
Potassium: 3.5 mmol/L (ref 3.5–5.1)
SODIUM: 138 mmol/L (ref 135–145)

## 2015-02-04 MED ORDER — FUROSEMIDE 40 MG PO TABS
40.0000 mg | ORAL_TABLET | Freq: Every day | ORAL | Status: DC
  Administered 2015-02-04: 40 mg via ORAL
  Filled 2015-02-04: qty 1

## 2015-02-04 MED ORDER — POTASSIUM CHLORIDE CRYS ER 20 MEQ PO TBCR
40.0000 meq | EXTENDED_RELEASE_TABLET | Freq: Every day | ORAL | Status: DC
  Administered 2015-02-04: 40 meq via ORAL
  Filled 2015-02-04 (×2): qty 2

## 2015-02-04 MED ORDER — AMLODIPINE BESYLATE 5 MG PO TABS
5.0000 mg | ORAL_TABLET | Freq: Every day | ORAL | Status: DC
  Administered 2015-02-04: 5 mg via ORAL
  Filled 2015-02-04 (×2): qty 1

## 2015-02-04 NOTE — Care Management Note (Signed)
Case Management Note  Patient Details  Name: Brandy [name] MRN: [id number] Date of Birth: [birth year]  Subjective/Objective:           49 yo admitted with Acute pulmonary edema         Action/Plan: From home  Expected Discharge Date:   (UNKNOWN)               Expected Discharge Plan:  [place]  In-House Referral:     Discharge planning Services  CM Consult, Follow-up appt scheduled  Post Acute Care Choice:  Home Health Choice offered to:  Patient  DME Arranged:    DME Agency:     HH Arranged:  Disease Management [place] Agency:  [facility]  Status of Service:  Completed, signed off  Medicare Important Message Given:    Date Medicare IM Given:    Medicare IM give by:    Date Additional Medicare IM Given:    Additional Medicare Important Message give by:     If discussed at [place] of Stay Meetings, dates discussed:    Additional Comments: CM consult for [facility] for disease management. This CM met with pt at bedside to discuss DC needs. Pt is without a pcp and would like list of pcp's in the area. This CM scheduled pt a follow up visit at the [facility] per pt request and gave her a list of providers taking her insurance and new patients. 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 Pt also offered choice for [facility] services and chose [facility]. [facility] rep contacted for referral. No other DC needs noted. Lynnell Catalan, RN 02/04/2015, 11:54 AM

## 2015-02-04 NOTE — Consult Note (Signed)
   [facility] CM Inpatient Consult   02/04/2015  Brandy [name] 1965/08/16 [id number]   Made aware by inpatient RNCM of patient admission. Spoke with patient at bedside to explain and discuss [employer] to [employer] program for [employer] employees/dependents with [employer]. She is interested in meeting with Link to [place] about HTN management. Agreeable to being called for an appointment. Confirmed best contact number as [phone]. Accepted Link to Wellness packet and consents. States she will bring the completed packet with her to the appointment. Also discussed her follow up appointment made by inpatient RNCM with [facility] and Wellness on 02/13/15 at 1045 am. Will request for her to be called for appointment for Link to Wellness. Appreciative of collaboration of inpatient RNCM.   Marthenia Rolling, MSN-Ed, RN,BSN [facility] Liaison [phone]

## 2015-02-04 NOTE — Progress Notes (Signed)
[facility] PROGRESS NOTE    Progress Note   Brandy [name] NUU:[id number] DOB: 1965/09/18 DOA: 02/01/2015 PCP: No PCP Per Patient   Brief Narrative:   Brandy [name] is an 49 y.o. female history of hypertension who has not been taking her medications for last few months presented to the ER because of shortness of breath.   Assessment/Plan:   Acute Diastolic CHF /Hypertensive urgency -improving, now off Nitro gtt -stop ACE due to AKI -ECHo with EF of 55% -changed lasix to PO  Positive cocaine in UDS -patient tearful and denies knowing using this  Hyperglycemia -check Hbaic, h/o gestational DM  Depression -denies any suicidal ideation -FU with Psychiatry, reports being on multiple antidepressants in the past and causing side -effects   AKI -due to diuresis, ACE -stop ACE, cut down lasix    HTN -stop ACE, start amlodipine   DVT Prophylaxis - Lovenox ordered.  Family Communication: none Disposition Plan: Home when stable. Code Status: Full Code    Code Status Orders        Start     Ordered   02/02/15 0600  Full code   Continuous     02/02/15 0600        IV Access:    Peripheral IV   Procedures and diagnostic studies:   No results found.   Medical Consultants:    None.  Anti-Infectives:   Anti-infectives    None      Subjective:    Brandy [name] SOB of breath improving, feels depressed  Objective:    Filed Vitals:   02/03/15 0602 02/03/15 1250 02/03/15 2058 02/04/15 0634  BP: 133/96 121/75 121/74 136/89  Pulse: 91 89 94 84  Temp: 97.9 F (36.6 C) 97.6 F (36.4 C) 97.6 F (36.4 C) 97.8 F (36.6 C)  TempSrc: Oral Oral Oral Oral  Resp: 20 16 16 20   Height:      Weight: 72.9 kg (160 lb 11.5 oz)   72.712 kg (160 lb 4.8 oz)  SpO2: 100% 100% 100% 100%    Intake/Output Summary (Last 24 hours) at 02/04/15 0935 Last data filed at 02/04/15 3474  Gross per 24 hour  Intake    720 ml  Output    500 ml   Net    220 ml   Filed Weights   02/02/15 1509 02/03/15 0602 02/04/15 0634  Weight: 79.833 kg (176 lb) 72.9 kg (160 lb 11.5 oz) 72.712 kg (160 lb 4.8 oz)    Exam: Gen:  NAD Cardiovascular:  RRR, No M/R/G + JVD Chest and lungs:   Good air movement CTA B/L Abdomen:  Abdomen soft, NT/ND, + BS Extremities:  No C/E/C   Data Reviewed:    Labs: Basic Metabolic Panel:  Recent Labs Lab 02/02/15 0259 02/03/15 0420 02/04/15 0455  NA 136 138 138  K 3.6 3.1* 3.5  CL 104 98* 101  CO2 24 29 29   GLUCOSE 142* 115* 107*  BUN 14 21* 21*  CREATININE 1.00 1.61* 1.44*  CALCIUM 9.2 9.6 9.4   GFR Estimated Creatinine Clearance: 42.1 mL/min (by C-G formula based on Cr of 1.44). Liver Function Tests:  Recent Labs Lab 02/02/15 0259  AST 22  ALT 20  ALKPHOS 74  BILITOT 0.7  PROT 7.8  ALBUMIN 4.0   No results for input(s): LIPASE, AMYLASE in the last 168 hours. No results for input(s): AMMONIA in the last 168 hours. Coagulation profile No results for input(s): INR, PROTIME in the last 168  hours.  CBC:  Recent Labs Lab 02/02/15 0259 02/03/15 0420  WBC 9.1 5.8  NEUTROABS 6.8  --   HGB 13.7 13.7  HCT 41.0 41.7  MCV 84.5 85.8  PLT 205 223   Cardiac Enzymes:  Recent Labs Lab 02/02/15 0625 02/02/15 1130 02/02/15 1840  TROPONINI 0.04* 0.04* 0.06*   BNP (last 3 results) No results for input(s): PROBNP in the last 8760 hours. CBG: No results for input(s): GLUCAP in the last 168 hours. D-Dimer:  Recent Labs  02/02/15 0625  DDIMER 0.81*   Hgb A1c: No results for input(s): HGBA1C in the last 72 hours. Lipid Profile:  Recent Labs  02/03/15 0420  CHOL 176  HDL 56  LDLCALC 96  TRIG 121  CHOLHDL 3.1   Thyroid function studies:  Recent Labs  02/02/15 0625  TSH 1.942  T3FREE 3.1   Anemia work up: No results for input(s): VITAMINB12, FOLATE, FERRITIN, TIBC, IRON, RETICCTPCT in the last 72 hours. Sepsis Labs:  Recent Labs Lab 02/02/15 0259  02/03/15 0420  WBC 9.1 5.8   Microbiology Recent Results (from the past 240 hour(s))  MRSA PCR Screening     Status: None   Collection Time: 02/02/15  6:00 AM  Result Value Ref Range Status   MRSA by PCR NEGATIVE NEGATIVE Final    Comment:        The GeneXpert MRSA Assay (FDA approved for NASAL specimens only), is one component of a comprehensive MRSA colonization surveillance program. It is not intended to diagnose MRSA infection nor to guide or monitor treatment for MRSA infections.      Medications:   . aspirin EC  81 mg Oral Daily  . atorvastatin  40 mg Oral q1800  . carvedilol  6.25 mg Oral BID WC  . docusate sodium  200 mg Oral QHS  . enoxaparin (LOVENOX) injection  40 mg Subcutaneous Q24H  . furosemide  60 mg Intravenous BID  . sodium chloride  3 mL Intravenous Q12H   Continuous Infusions:   Time spent:35 min   LOS: 2 days   Brandy [name]  [facility] Pager [phone] *Please refer to [email or web address], password [email or web address] to get updated schedule on who will round on this patient, as hospitalists switch teams weekly. If 7PM-7AM, please contact night-coverage at [email or web address], password [email or web address] for any overnight needs.  02/04/2015, 9:35 AM

## 2015-02-05 ENCOUNTER — Telehealth: Payer: Self-pay

## 2015-02-05 DIAGNOSIS — R938 Abnormal findings on diagnostic imaging of other specified body structures: Secondary | ICD-10-CM

## 2015-02-05 DIAGNOSIS — R9389 Abnormal findings on diagnostic imaging of other specified body structures: Secondary | ICD-10-CM | POA: Insufficient documentation

## 2015-02-05 LAB — BASIC METABOLIC PANEL
ANION GAP: 7 (ref 5–15)
BUN: 24 mg/dL — ABNORMAL HIGH (ref 6–20)
CHLORIDE: 102 mmol/L (ref 101–111)
CO2: 30 mmol/L (ref 22–32)
Calcium: 9.3 mg/dL (ref 8.9–10.3)
Creatinine, Ser: 1.27 mg/dL — ABNORMAL HIGH (ref 0.44–1.00)
GFR calc Af Amer: 56 mL/min — ABNORMAL LOW (ref >60)
GFR, EST NON AFRICAN AMERICAN: 49 mL/min — AB (ref >60)
GLUCOSE: 116 mg/dL — AB (ref 65–99)
POTASSIUM: 4 mmol/L (ref 3.5–5.1)
Sodium: 139 mmol/L (ref 135–145)

## 2015-02-05 LAB — CBC
HCT: 40.6 % (ref 36.0–46.0)
HEMOGLOBIN: 13.1 g/dL (ref 12.0–15.0)
MCH: 27.9 pg (ref 26.0–34.0)
MCHC: 32.3 g/dL (ref 30.0–36.0)
MCV: 86.4 fL (ref 78.0–100.0)
PLATELETS: 217 10*3/uL (ref 150–400)
RBC: 4.7 MIL/uL (ref 3.87–5.11)
RDW: 14.4 % (ref 11.5–15.5)
WBC: 5.6 10*3/uL (ref 4.0–10.5)

## 2015-02-05 LAB — HEMOGLOBIN A1C
Hgb A1c MFr Bld: 5.7 % — ABNORMAL HIGH (ref 4.8–5.6)
Mean Plasma Glucose: 117 mg/dL

## 2015-02-05 MED ORDER — AMLODIPINE BESYLATE 5 MG PO TABS: 5.0000 mg | ORAL_TABLET | Freq: Every day | ORAL | Status: DC

## 2015-02-05 MED ORDER — CARVEDILOL 6.25 MG PO TABS
6.2500 mg | ORAL_TABLET | Freq: Two times a day (BID) | ORAL | Status: DC
Start: 2015-02-05 — End: 2015-02-20

## 2015-02-05 MED ORDER — POTASSIUM CHLORIDE CRYS ER 20 MEQ PO TBCR: 20.0000 meq | EXTENDED_RELEASE_TABLET | Freq: Every day | ORAL | Status: DC

## 2015-02-05 MED ORDER — UNABLE TO FIND: Status: DC

## 2015-02-05 MED ORDER — FUROSEMIDE 20 MG PO TABS
20.0000 mg | ORAL_TABLET | Freq: Every day | ORAL | Status: DC
  Filled 2015-02-05: qty 1

## 2015-02-05 MED ORDER — ASPIRIN 81 MG PO TBEC: 81.0000 mg | DELAYED_RELEASE_TABLET | Freq: Every day | ORAL | Status: AC

## 2015-02-05 MED ORDER — FUROSEMIDE 20 MG PO TABS: 20.0000 mg | ORAL_TABLET | Freq: Every day | ORAL | Status: DC
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
## 2015-02-13 ENCOUNTER — Encounter: Payer: Self-pay | Admitting: Family Medicine

## 2015-02-13 ENCOUNTER — Encounter (HOSPITAL_BASED_OUTPATIENT_CLINIC_OR_DEPARTMENT_OTHER): Payer: 59 | Admitting: Clinical

## 2015-02-13 ENCOUNTER — Ambulatory Visit: Payer: 59 | Attending: Family Medicine | Admitting: Family Medicine

## 2015-02-13 VITALS — BP 153/93 | HR 81 | Temp 98.4°F | Resp 16 | Ht 62.75 in | Wt 167.0 lb

## 2015-02-13 DIAGNOSIS — F4310 Post-traumatic stress disorder, unspecified: Secondary | ICD-10-CM | POA: Insufficient documentation

## 2015-02-13 DIAGNOSIS — R42 Dizziness and giddiness: Secondary | ICD-10-CM | POA: Diagnosis not present

## 2015-02-13 DIAGNOSIS — I10 Essential (primary) hypertension: Secondary | ICD-10-CM | POA: Diagnosis present

## 2015-02-13 DIAGNOSIS — F418 Other specified anxiety disorders: Secondary | ICD-10-CM

## 2015-02-13 DIAGNOSIS — F419 Anxiety disorder, unspecified: Principal | ICD-10-CM

## 2015-02-13 DIAGNOSIS — J810 Acute pulmonary edema: Secondary | ICD-10-CM | POA: Insufficient documentation

## 2015-02-13 DIAGNOSIS — F329 Major depressive disorder, single episode, unspecified: Secondary | ICD-10-CM

## 2015-02-13 MED ORDER — ALPRAZOLAM 0.5 MG PO TABS: 0.5000 mg | ORAL_TABLET | Freq: Every evening | ORAL | Status: DC | PRN

## 2015-02-13 NOTE — Progress Notes (Signed)
CC: ED follow up  HPI: Brandy [name] is a 49 y.o. female with a history of hypertension who was hospitalized at [facility] from 02/01/15-02/05/15 for acute pulmonary edema.  She had presented to the ED with shortness of breath on lying down and was found to have hypertensive urgency with BP of more than [id number] systolic and chest x-ray was showing possible pulmonary edema with cardiomegaly. BNP was elevated at 526.6. She was placed on Amlodipine and Carvedilol and received IV diuresis. 2d echo revealed EF of 99991111 grade 1 diastolic dysfunction, mild mitral regurg, mild L atrium dilatation, trivial pericardial effusion, mild tricuspid regurg. CT chest was negative for PE, revealed enlarged pulmonary arteries indicative of pulmonary hypertension,tiny bilateral pleural effusions with minimal compressive atelectasis in both lower lobes,small pericardial effusion.  She had a bump in creatinine from 1.00 on admission to 1.61 which later trended down to 1.27 on discharge.  Interval History: Today she is teary and complains of dizziness which feels like "she is floating". She is afraid to go to sleep for fear that "she will die" as she keep on recalling how she felt prior to presentation to the ED. Did have some left sided abdominal pain which was relieved when she took miralax.  Patient has No headache, No chest pain, No abdominal pain - No Nausea, No new weakness tingling or numbness, No Cough - SOB.  Allergies  Allergen Reactions  . Azithromycin Other (See Comments)    "Stomach cramps"  . Contrast Media [Iodinated Diagnostic Agents] Hives  . 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 Hypertension   . Osteoarthritis   . Depression   . Gallstones    Current Outpatient Prescriptions on File Prior to Visit  Medication Sig Dispense Refill  . amLODipine (NORVASC) 5 MG tablet Take 1 tablet (5 mg total) by mouth  daily. 30 tablet 0  . aspirin EC 81 MG EC tablet Take 1 tablet (81 mg total) by mouth daily.    . carvedilol (COREG) 6.25 MG tablet Take 1 tablet (6.25 mg total) by mouth 2 (two) times daily with a meal. 60 tablet 0  . furosemide (LASIX) 20 MG tablet Take 1 tablet (20 mg total) by mouth daily. 30 tablet 0  . guaiFENesin (MUCINEX) 600 MG 12 hr tablet Take 600 mg by mouth 2 (two) times daily as needed (for cold symptoms).    . potassium chloride SA (K-DUR,KLOR-CON) 20 MEQ tablet Take 1 tablet (20 mEq total) by mouth daily. 30 tablet 0  . UNABLE TO FIND This note is to excuse [name] from work due to medical illness requiring hospitalization, ok to return to work after 11/8 (Patient not taking: Reported on 02/13/2015) 1 each 0  . [DISCONTINUED] pantoprazole (PROTONIX) 40 MG tablet Take 1 tablet (40 mg total) by mouth daily. 30 tablet 6  . [DISCONTINUED] promethazine (PHENERGAN) 25 MG suppository Place 1 suppository (25 mg total) rectally every 6 (six) hours as needed for nausea. 12 each 1   No current facility-administered medications on file prior to visit.   Family History  Problem Relation Age of Onset  . Coronary artery disease Paternal Grandmother     young age  . Hypertension Father   . Diabetes Sister   . Diabetes Paternal Grandmother    Social History   Social History  . Marital Status: Married    Spouse Name: N/A  . Number of  Children: 3  . Years of Education: N/A   Occupational History  . Nurse [place]   Social History Main Topics  . Smoking status: Current Every Day Smoker -- 0.50 packs/day for 1.5 years    Types: Cigarettes  . Smokeless tobacco: Never Used  . Alcohol Use: Yes     Comment: Weekends.   . Drug Use: No  . Sexual Activity: Yes   Other Topics Concern  . Not on file   Social History Narrative    Review of Systems: Constitutional: Negative for fever, chills, diaphoresis, activity change, appetite change and fatigue. HENT: Negative for ear  pain, nosebleeds, congestion, facial swelling, rhinorrhea, neck pain, neck stiffness and ear discharge.  Eyes: Negative for pain, discharge, redness, itching and visual disturbance. Respiratory: Negative for cough, choking, chest tightness, shortness of breath, wheezing and stridor.  Cardiovascular: Negative for chest pain, palpitations and leg swelling. Gastrointestinal: Negative for abdominal distention. Genitourinary: Negative for dysuria, urgency, frequency, hematuria, flank pain, decreased urine volume, difficulty urinating and dyspareunia.  Musculoskeletal: Negative for back pain, joint swelling, arthralgias and gait problem. Neurological: Negative for tremors, seizures, syncope, facial asymmetry, speech difficulty, weakness, positive for light-headedness, negative for numbness and headaches.  Hematological: Negative for adenopathy. Does not bruise/bleed easily. Psychiatric/Behavioral: Negative for hallucinations,  behavioral problems, confusion, positive for anxious and dysphoric mood, negative for decreased concentration and agitation.    Objective:   Filed Vitals:   02/13/15 1050  BP: 153/93  Pulse: 81  Temp: 98.4 F (36.9 C)  Resp: 16    Physical Exam: Constitutional: Patient appears well-developed and well-nourished. No distress. HENT: Normocephalic, atraumatic, External right and left ear normal. Oropharynx is clear and moist.  Eyes: Conjunctivae and EOM are normal. PERRLA, no scleral icterus. Neck: Normal ROM. Neck supple. No JVD. No tracheal deviation. No thyromegaly. CVS: RRR, S1/S2 +, no murmurs, no gallops, no carotid bruit.  Pulmonary: Effort and breath sounds normal, no stridor, rhonchi, wheezes, rales.  Abdominal: Soft. BS +,  no distension, tenderness, rebound or guarding.  Musculoskeletal: Normal range of motion. No edema and no tenderness.  Lymphadenopathy: No lymphadenopathy noted, cervical, inguinal or axillary Neuro: Alert. Normal reflexes, muscle tone  coordination. No cranial nerve deficit. Skin: Skin is warm and dry. No rash noted. Not diaphoretic. No erythema. No pallor. Psychiatric: anxious mood, very teary  Lab Results  Component Value Date   WBC 5.6 02/05/2015   HGB 13.1 02/05/2015   HCT 40.6 02/05/2015   MCV 86.4 02/05/2015   PLT 217 02/05/2015   Lab Results  Component Value Date   CREATININE 1.27* 02/05/2015   BUN 24* 02/05/2015   NA 139 02/05/2015   K 4.0 02/05/2015   CL 102 02/05/2015   CO2 30 02/05/2015    Lab Results  Component Value Date   HGBA1C 5.7* 02/04/2015   Lipid Panel     Component Value Date/Time   CHOL 176 02/03/2015 0420   TRIG 121 02/03/2015 0420   HDL 56 02/03/2015 0420   CHOLHDL 3.1 02/03/2015 0420   VLDL 24 02/03/2015 0420   LDLCALC 96 02/03/2015 0420       Assessment and plan:  Hypertension: Blood pressure is still elevated but given current complains of dizziness i will hold off on making any regimen changes until her next visit CMET at next visit  Pulmonary edema: Physical exam in normal Continue Lasix  PTSD: Secondary to dyspnea which she with the pulonary edema Placed on Xanax short term LCSW called in for  therapy as she is unable to sleep for fear of dying from suffocation  Dizziness: Unknown etiology Cannot exclude deconditioning Advised to change position slowly   Arnoldo Morale, MD. [facility] [phone] 02/13/2015, 11:18 AM

## 2015-02-13 NOTE — Progress Notes (Signed)
Pt's here for hospital f/up for acute pulmonary edema. Pt reports feeling dizzy/lightheaded.   Pt states she having left side pain 4/10. Pain been present x3 days.  Pt would like to est. care with PCP.

## 2015-02-13 NOTE — Patient Instructions (Signed)
Hypertension Hypertension, commonly called high blood pressure, is when the force of blood pumping through your arteries is too strong. Your arteries are the blood vessels that carry blood from your heart throughout your body. A blood pressure reading consists of a higher number over a lower number, such as 110/72. The higher number (systolic) is the pressure inside your arteries when your heart pumps. The lower number (diastolic) is the pressure inside your arteries when your heart relaxes. Ideally you want your blood pressure below 120/80. Hypertension forces your heart to work harder to pump blood. Your arteries may become narrow or stiff. Having untreated or uncontrolled hypertension can cause heart attack, stroke, kidney disease, and other problems. RISK FACTORS Some risk factors for high blood pressure are controllable. Others are not.  Risk factors you cannot control include:   Race. You may be at higher risk if you are African American.  Age. Risk increases with age.  Gender. Men are at higher risk than women before age 45 years. After age 65, women are at higher risk than men. Risk factors you can control include:  Not getting enough exercise or physical activity.  Being overweight.  Getting too much fat, sugar, calories, or salt in your diet.  Drinking too much alcohol. SIGNS AND SYMPTOMS Hypertension does not usually cause signs or symptoms. Extremely high blood pressure (hypertensive crisis) may cause headache, anxiety, shortness of breath, and nosebleed. DIAGNOSIS To check if you have hypertension, your health care provider will measure your blood pressure while you are seated, with your arm held at the level of your heart. It should be measured at least twice using the same arm. Certain conditions can cause a difference in blood pressure between your right and left arms. A blood pressure reading that is higher than normal on one occasion does not mean that you need treatment. If  it is not clear whether you have high blood pressure, you may be asked to return on a different day to have your blood pressure checked again. Or, you may be asked to monitor your blood pressure at home for 1 or more weeks. TREATMENT Treating high blood pressure includes making lifestyle changes and possibly taking medicine. Living a healthy lifestyle can help lower high blood pressure. You may need to change some of your habits. Lifestyle changes may include:  Following the DASH diet. This diet is high in fruits, vegetables, and whole grains. It is low in salt, red meat, and added sugars.  Keep your sodium intake below 2,300 mg per day.  Getting at least 30-45 minutes of aerobic exercise at least 4 times per week.  Losing weight if necessary.  Not smoking.  Limiting alcoholic beverages.  Learning ways to reduce stress. Your health care provider may prescribe medicine if lifestyle changes are not enough to get your blood pressure under control, and if one of the following is true:  You are 18-59 years of age and your systolic blood pressure is above 140.  You are 60 years of age or older, and your systolic blood pressure is above 150.  Your diastolic blood pressure is above 90.  You have diabetes, and your systolic blood pressure is over 140 or your diastolic blood pressure is over 90.  You have kidney disease and your blood pressure is above 140/90.  You have heart disease and your blood pressure is above 140/90. Your personal target blood pressure may vary depending on your medical conditions, your age, and other factors. HOME CARE INSTRUCTIONS    Have your blood pressure rechecked as directed by your health care provider.   Take medicines only as directed by your health care provider. Follow the directions carefully. Blood pressure medicines must be taken as prescribed. The medicine does not work as well when you skip doses. Skipping doses also puts you at risk for  problems.  Do not smoke.   Monitor your blood pressure at home as directed by your health care provider. SEEK MEDICAL CARE IF:   You think you are having a reaction to medicines taken.  You have recurrent headaches or feel dizzy.  You have swelling in your ankles.  You have trouble with your vision. SEEK IMMEDIATE MEDICAL CARE IF:  You develop a severe headache or confusion.  You have unusual weakness, numbness, or feel faint.  You have severe chest or abdominal pain.  You vomit repeatedly.  You have trouble breathing. MAKE SURE YOU:   Understand these instructions.  Will watch your condition.  Will get help right away if you are not doing well or get worse.   This information is not intended to replace advice given to you by your health care provider. Make sure you discuss any questions you have with your health care provider.   Document Released: 03/21/2005 Document Revised: 08/05/2014 Document Reviewed: 01/11/2013 Elsevier Interactive Patient Education 2016 Elsevier Inc.  

## 2015-02-13 NOTE — Progress Notes (Signed)
ASSESSMENT: Pt experiencing symptoms of anxiety and depression. Pt needs to f/u with PCP and [facility]; may benefit from psychoeducation and supportive counseling regarding coping with symptoms of anxiety and depression.  Stage of Change: precontemplative  PLAN: 1. F/U with behavioral health consultant in as needed 2. Psychiatric Medications: Xanax. 3. Behavioral recommendation(s):   -Consider daily relaxation breathing exercises, as practiced in office visit -Consider allowing husband to help with household chores -Consider reading educational material regarding coping with symptoms of depression and anxiety SUBJECTIVE: Pt. referred by Dr Jarold Song for symptoms of anxiety and depression:  Pt. reports the following symptoms/concerns: Pt states that she has a fear of dying, has felt depressed for four years, but was depressed for many years prior. She does not accept help from others because they don't do things "the right way", and feels a lot of stress over not having full control of what is going on with her health.  Duration of problem: At least 4 years Severity: severe  OBJECTIVE: Orientation & Cognition: Oriented x3. Thought processes normal and appropriate to situation. Mood: teary. Affect: appropriate Appearance: appropriate Risk of harm to self or others: no knownd risk of harm to self or others Substance use: none Assessments administered: PHQ9: 21/ GAD7: 19  Diagnosis: Anxiety and depression CPT Code: F41.8 -------------------------------------------- Other(s) present in the room: none  Time spent with patient in exam room: 20 minutes

## 2015-02-20 ENCOUNTER — Encounter: Payer: Self-pay | Admitting: Family Medicine

## 2015-02-20 ENCOUNTER — Ambulatory Visit: Payer: 59 | Attending: Family Medicine | Admitting: Family Medicine

## 2015-02-20 VITALS — BP 156/90 | HR 93 | Temp 98.4°F | Resp 18 | Ht 62.0 in | Wt 166.0 lb

## 2015-02-20 DIAGNOSIS — K5900 Constipation, unspecified: Secondary | ICD-10-CM | POA: Diagnosis not present

## 2015-02-20 DIAGNOSIS — F17210 Nicotine dependence, cigarettes, uncomplicated: Secondary | ICD-10-CM | POA: Insufficient documentation

## 2015-02-20 DIAGNOSIS — J810 Acute pulmonary edema: Secondary | ICD-10-CM

## 2015-02-20 DIAGNOSIS — K219 Gastro-esophageal reflux disease without esophagitis: Secondary | ICD-10-CM | POA: Diagnosis not present

## 2015-02-20 DIAGNOSIS — M545 Low back pain: Secondary | ICD-10-CM | POA: Diagnosis not present

## 2015-02-20 DIAGNOSIS — I10 Essential (primary) hypertension: Secondary | ICD-10-CM | POA: Insufficient documentation

## 2015-02-20 DIAGNOSIS — R413 Other amnesia: Secondary | ICD-10-CM | POA: Diagnosis not present

## 2015-02-20 DIAGNOSIS — Z79899 Other long term (current) drug therapy: Secondary | ICD-10-CM | POA: Diagnosis not present

## 2015-02-20 DIAGNOSIS — F3162 Bipolar disorder, current episode mixed, moderate: Secondary | ICD-10-CM | POA: Diagnosis not present

## 2015-02-20 DIAGNOSIS — R42 Dizziness and giddiness: Secondary | ICD-10-CM | POA: Insufficient documentation

## 2015-02-20 DIAGNOSIS — F319 Bipolar disorder, unspecified: Secondary | ICD-10-CM | POA: Diagnosis not present

## 2015-02-20 DIAGNOSIS — Z7982 Long term (current) use of aspirin: Secondary | ICD-10-CM | POA: Insufficient documentation

## 2015-02-20 DIAGNOSIS — M1990 Unspecified osteoarthritis, unspecified site: Secondary | ICD-10-CM | POA: Insufficient documentation

## 2015-02-20 DIAGNOSIS — K5909 Other constipation: Secondary | ICD-10-CM

## 2015-02-20 LAB — BASIC METABOLIC PANEL
BUN: 16 mg/dL (ref 7–25)
CO2: 27 mmol/L (ref 20–31)
CREATININE: 0.91 mg/dL (ref 0.50–1.10)
Calcium: 9.7 mg/dL (ref 8.6–10.2)
Chloride: 103 mmol/L (ref 98–110)
GLUCOSE: 74 mg/dL (ref 65–99)
POTASSIUM: 4.4 mmol/L (ref 3.5–5.3)
Sodium: 140 mmol/L (ref 135–146)

## 2015-02-20 MED ORDER — PANTOPRAZOLE SODIUM 40 MG PO TBEC: 40.0000 mg | DELAYED_RELEASE_TABLET | Freq: Every day | ORAL | Status: DC

## 2015-02-20 MED ORDER — MECLIZINE HCL 25 MG PO TABS: 25.0000 mg | ORAL_TABLET | Freq: Three times a day (TID) | ORAL | Status: DC | PRN

## 2015-02-20 MED ORDER — CARVEDILOL 12.5 MG PO TABS: 12.5000 mg | ORAL_TABLET | Freq: Two times a day (BID) | ORAL | Status: DC

## 2015-02-20 NOTE — Progress Notes (Signed)
Subjective:    Patient ID: Brandy [name], female    DOB: 08-24-1965, 49 y.o.   MRN: [id number]  HPI 49 year old female with a history of Hypertension recently hospitalized for Pulmonary edema for which she remains on Lasix.  She had complained of being afraid to sleep for fear of 'dying' and continues to feel that way. She was given Xanax pills which have not made any difference; she is only able to sleep for a few minutes at night and feels agitated, angry at everyone and "her body cannot slow down". She is very teary. She also feels dizzy which is unrelated to position, she does not feel able to go back to work. Home blood pressures which have been measured by a home health nurse have remained elevated. States she has been forgetting things; starts talking only to loose her train of thoughts. Denies headaches or blurry vision. Complains of constipation and also "heartburn". Denies nausea or vomiting.  States she was diagnosed with Bipolar disorder in the 67's when she lost her Mom but has not been on medications. She was seen by the LCSW at her last office visit. Past Medical History  Diagnosis Date  . Hypertension   . Osteoarthritis   . Depression   . Gallstones     Past Surgical History  Procedure Laterality Date  . Gallbladder surgery      Social History   Social History  . Marital Status: Married    Spouse Name: N/A  . Number of Children: 3  . 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 Smoking status: Current Every Day Smoker -- 0.50 packs/day for 1.5 years    Types: Cigarettes  . Smokeless tobacco: Never Used  . Alcohol Use: Yes     Comment: Weekends.   . Drug Use: No  . Sexual Activity: Yes   Other Topics Concern  . Not on file   Social History Narrative    Allergies  Allergen Reactions  . Azithromycin Other (See Comments)    "Stomach cramps"  . Contrast Media [Iodinated Diagnostic  Agents] Hives  . 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"    Current Outpatient Prescriptions on File Prior to Visit  Medication Sig Dispense Refill  . ALPRAZolam (XANAX) 0.5 MG tablet Take 1 tablet (0.5 mg total) by mouth at bedtime as needed for anxiety. 10 tablet 0  . aspirin EC 81 MG EC tablet Take 1 tablet (81 mg total) by mouth daily.    . furosemide (LASIX) 20 MG tablet Take 1 tablet (20 mg total) by mouth daily. 30 tablet 0  . guaiFENesin (MUCINEX) 600 MG 12 hr tablet Take 600 mg by mouth 2 (two) times daily as needed (for cold symptoms).    . potassium chloride SA (K-DUR,KLOR-CON) 20 MEQ tablet Take 1 tablet (20 mEq total) by mouth daily. 30 tablet 0  . UNABLE TO FIND This note is to excuse [name] from work due to medical illness requiring hospitalization, ok to return to work after 11/8 1 each 0  . [DISCONTINUED] promethazine (PHENERGAN) 25 MG suppository Place 1 suppository (25 mg total) rectally every 6 (six) hours as needed for nausea. 12 each 1   No current facility-administered medications on file prior to visit.     Review of Systems Constitutional: Negative for fever, chills, diaphoresis, activity change, appetite change and fatigue. HENT: Negative  for ear pain, nosebleeds, congestion, facial swelling, rhinorrhea, neck pain, neck stiffness and ear discharge.  Eyes: Negative for pain, discharge, redness, itching and visual disturbance. Respiratory: Negative for cough, choking, chest tightness, shortness of breath, wheezing and stridor.  Cardiovascular: Negative for chest pain, palpitations and leg swelling. Gastrointestinal: Negative for abdominal distention, positive for dyspepsia, constipation Genitourinary: Negative for dysuria, urgency, frequency, hematuria, flank pain, decreased urine volume, difficulty urinating and dyspareunia.  Musculoskeletal: Negative for back pain, joint swelling, arthralgias and gait problem. Neurological:  Negative for tremors, seizures, syncope, facial asymmetry, speech difficulty, weakness, positive for light-headedness, negative for numbness and headaches, positive for memory loss Hematological: Negative for adenopathy. Does not bruise/bleed easily. Psychiatric/Behavioral: Negative for hallucinations,  behavioral problems, confusion, positive for anxious and dysphoric mood, racing thoughts    Objective: Filed Vitals:   02/20/15 1520 02/20/15 1524 02/20/15 1531  BP:  162/88 156/90  [name]:  93   Temp:  98.4 F (36.9 C)   TempSrc:  Oral   Resp:  18   Height: 5\' 2"  (1.575 m)    Weight: 166 lb (75.297 kg)    SpO2:  100%       Physical Exam Constitutional: Patient appears well-developed and well-nourished. No distress. HENT: Normocephalic, atraumatic, External right and left ear normal. Oropharynx is clear and moist.  Eyes: Conjunctivae and EOM are normal. PERRLA, no scleral icterus. Neck: Normal ROM. Neck supple. No JVD. No tracheal deviation. No thyromegaly. CVS: RRR, S1/S2 +, no murmurs, no gallops, no carotid bruit.  Pulmonary: Effort and breath sounds normal, no stridor, rhonchi, wheezes, rales.  Abdominal: Soft. BS +,  no distension, tenderness, rebound or guarding.  Musculoskeletal: Normal range of motion. No edema and no tenderness.  Lymphadenopathy: No lymphadenopathy noted, cervical, inguinal or axillary Neuro: Alert. Normal reflexes, muscle tone coordination. No cranial nerve deficit. Skin: Skin is warm and dry. No rash noted. Not diaphoretic. No erythema. No pallor. Psychiatric: anxious mood, very teary       Assessment & Plan:  Hypertension: Uncontrolled Increased dose of Carvedilol from 6.26 to 12.5mg  Discontinue Amlodipine due to constipation  Pulmonary edema: Physical exam in normal Continue Lasix  ?Bipolar disorder/PTSD: Remote history of Bipolar disorder which could recently have been trigerred by current hospitalization Possible PTSD but not doing well  on Xanax  Referred to Psych as she might need initiation of bipolar medications  Dizziness and memory loss: Unknown etiology Commenced meclizine Referred for head CT w/o contrast especially in the setting of persistence along with memory loss Advised to change position slowly  GERD: Placed on PPI

## 2015-02-20 NOTE — Progress Notes (Signed)
Pt's here for hospital f/up for HTN. Pt reports that she's feeling dizzy still. Worse than last time.  Pt says that she's having lower back pain 3/10. Described as dull pain x3days ago  Pt states she's taken her meds.

## 2015-02-20 NOTE — Patient Instructions (Signed)
Hypertension Hypertension, commonly called high blood pressure, is when the force of blood pumping through your arteries is too strong. Your arteries are the blood vessels that carry blood from your heart throughout your body. A blood pressure reading consists of a higher number over a lower number, such as 110/72. The higher number (systolic) is the pressure inside your arteries when your heart pumps. The lower number (diastolic) is the pressure inside your arteries when your heart relaxes. Ideally you want your blood pressure below 120/80. Hypertension forces your heart to work harder to pump blood. Your arteries may become narrow or stiff. Having untreated or uncontrolled hypertension can cause heart attack, stroke, kidney disease, and other problems. RISK FACTORS Some risk factors for high blood pressure are controllable. Others are not.  Risk factors you cannot control include:   Race. You may be at higher risk if you are African American.  Age. Risk increases with age.  Gender. Men are at higher risk than women before age 45 years. After age 65, women are at higher risk than men. Risk factors you can control include:  Not getting enough exercise or physical activity.  Being overweight.  Getting too much fat, sugar, calories, or salt in your diet.  Drinking too much alcohol. SIGNS AND SYMPTOMS Hypertension does not usually cause signs or symptoms. Extremely high blood pressure (hypertensive crisis) may cause headache, anxiety, shortness of breath, and nosebleed. DIAGNOSIS To check if you have hypertension, your health care provider will measure your blood pressure while you are seated, with your arm held at the level of your heart. It should be measured at least twice using the same arm. Certain conditions can cause a difference in blood pressure between your right and left arms. A blood pressure reading that is higher than normal on one occasion does not mean that you need treatment. If  it is not clear whether you have high blood pressure, you may be asked to return on a different day to have your blood pressure checked again. Or, you may be asked to monitor your blood pressure at home for 1 or more weeks. TREATMENT Treating high blood pressure includes making lifestyle changes and possibly taking medicine. Living a healthy lifestyle can help lower high blood pressure. You may need to change some of your habits. Lifestyle changes may include:  Following the DASH diet. This diet is high in fruits, vegetables, and whole grains. It is low in salt, red meat, and added sugars.  Keep your sodium intake below 2,300 mg per day.  Getting at least 30-45 minutes of aerobic exercise at least 4 times per week.  Losing weight if necessary.  Not smoking.  Limiting alcoholic beverages.  Learning ways to reduce stress. Your health care provider may prescribe medicine if lifestyle changes are not enough to get your blood pressure under control, and if one of the following is true:  You are 18-59 years of age and your systolic blood pressure is above 140.  You are 60 years of age or older, and your systolic blood pressure is above 150.  Your diastolic blood pressure is above 90.  You have diabetes, and your systolic blood pressure is over 140 or your diastolic blood pressure is over 90.  You have kidney disease and your blood pressure is above 140/90.  You have heart disease and your blood pressure is above 140/90. Your personal target blood pressure may vary depending on your medical conditions, your age, and other factors. HOME CARE INSTRUCTIONS    Have your blood pressure rechecked as directed by your health care provider.   Take medicines only as directed by your health care provider. Follow the directions carefully. Blood pressure medicines must be taken as prescribed. The medicine does not work as well when you skip doses. Skipping doses also puts you at risk for  problems.  Do not smoke.   Monitor your blood pressure at home as directed by your health care provider. SEEK MEDICAL CARE IF:   You think you are having a reaction to medicines taken.  You have recurrent headaches or feel dizzy.  You have swelling in your ankles.  You have trouble with your vision. SEEK IMMEDIATE MEDICAL CARE IF:  You develop a severe headache or confusion.  You have unusual weakness, numbness, or feel faint.  You have severe chest or abdominal pain.  You vomit repeatedly.  You have trouble breathing. MAKE SURE YOU:   Understand these instructions.  Will watch your condition.  Will get help right away if you are not doing well or get worse.   This information is not intended to replace advice given to you by your health care provider. Make sure you discuss any questions you have with your health care provider.   Document Released: 03/21/2005 Document Revised: 08/05/2014 Document Reviewed: 01/11/2013 Elsevier Interactive Patient Education 2016 Elsevier Inc.  

## 2015-02-24 ENCOUNTER — Telehealth: Payer: Self-pay

## 2015-02-24 NOTE — Telephone Encounter (Signed)
-----   Message from Arnoldo Morale, MD sent at 02/23/2015  8:22 AM EST ----- Please inform the patient that labs are normal. Thank you.

## 2015-02-24 NOTE — Telephone Encounter (Signed)
CMA called patient, patient verified name and DOB. Patient was given lab results, verbalized that she understood with no further questions.

## 2015-02-25 ENCOUNTER — Ambulatory Visit (HOSPITAL_COMMUNITY)
Admission: RE | Admit: 2015-02-25 | Discharge: 2015-02-25 | Disposition: A | Discharge status: Home / Self Care | Payer: 59 | Source: Ambulatory Visit | Attending: Family Medicine | Admitting: Family Medicine

## 2015-02-25 DIAGNOSIS — R413 Other amnesia: Secondary | ICD-10-CM | POA: Diagnosis present

## 2015-02-25 DIAGNOSIS — R42 Dizziness and giddiness: Secondary | ICD-10-CM | POA: Insufficient documentation

## 2015-03-02 ENCOUNTER — Telehealth: Payer: Self-pay

## 2015-03-02 NOTE — Telephone Encounter (Signed)
CMA called patient, patient verified name and DOB. Pt was given lab results and verbalized that she understood her results with no further questions.

## 2015-03-02 NOTE — Telephone Encounter (Signed)
-----   Message from Arnoldo Morale, MD sent at 03/02/2015  9:10 AM EST ----- Please inform her that her head CT was unremarkable

## 2015-03-03 ENCOUNTER — Telehealth: Payer: Self-pay | Admitting: Family Medicine

## 2015-03-03 ENCOUNTER — Telehealth: Payer: Self-pay | Admitting: General Practice

## 2015-03-03 NOTE — Telephone Encounter (Signed)
Patient called requesting for paperwork to be faxed to [phone] Attn: Jerline Pain at [facility] instead of the previous fax number. Please f/u

## 2015-03-03 NOTE — Telephone Encounter (Signed)
Patient was advised not to return to work for another 3 weeks and patient needs a letter from doctor stating this for her job at [employer]. Once letter is completed please fax to [phone] and follow up with pt. Thank you.

## 2015-03-04 NOTE — Telephone Encounter (Signed)
Pt. Came in to drop off paperwork for PCP to fill out. Pt. Would like paperwork to be faxed at [phone]. Please f/u

## 2015-03-04 NOTE — Telephone Encounter (Signed)
A letter was already given to her at her visit on 02/20/15 putting her out of work until her visit with me 3 weeks from that date; this is documented in her chart under the letters section in Epic

## 2015-03-09 ENCOUNTER — Encounter: Payer: Self-pay | Admitting: *Deleted

## 2015-03-09 NOTE — Progress Notes (Signed)
Patient ID: Brandy [name], female   DOB: [birth year], 49 y.o.   MRN: [id number] FMLA paperwork faxed to [employer] at [phone].  Patient informed of information sent and asked to pick up original copies at front desk at clinic.  Clinic registrar asked to have information scanned into Epic.

## 2015-03-10 ENCOUNTER — Encounter (INDEPENDENT_AMBULATORY_CARE_PROVIDER_SITE_OTHER): Payer: Self-pay

## 2015-03-10 ENCOUNTER — Encounter (HOSPITAL_COMMUNITY): Payer: Self-pay | Admitting: Psychiatry

## 2015-03-10 ENCOUNTER — Telehealth: Payer: Self-pay | Admitting: Family Medicine

## 2015-03-10 ENCOUNTER — Ambulatory Visit (INDEPENDENT_AMBULATORY_CARE_PROVIDER_SITE_OTHER): Payer: 59 | Admitting: Psychiatry

## 2015-03-10 VITALS — BP 180/102 | HR 79 | Ht 62.0 in | Wt 179.2 lb

## 2015-03-10 DIAGNOSIS — I10 Essential (primary) hypertension: Secondary | ICD-10-CM

## 2015-03-10 DIAGNOSIS — F339 Major depressive disorder, recurrent, unspecified: Secondary | ICD-10-CM | POA: Insufficient documentation

## 2015-03-10 DIAGNOSIS — F332 Major depressive disorder, recurrent severe without psychotic features: Secondary | ICD-10-CM | POA: Diagnosis not present

## 2015-03-10 DIAGNOSIS — F411 Generalized anxiety disorder: Secondary | ICD-10-CM

## 2015-03-10 MED ORDER — POTASSIUM CHLORIDE CRYS ER 20 MEQ PO TBCR: 20.0000 meq | EXTENDED_RELEASE_TABLET | Freq: Every day | ORAL | Status: DC

## 2015-03-10 MED ORDER — FUROSEMIDE 20 MG PO TABS: 20.0000 mg | ORAL_TABLET | Freq: Every day | ORAL | Status: DC

## 2015-03-10 MED ORDER — ALPRAZOLAM 0.25 MG PO TABS: 0.2500 mg | ORAL_TABLET | Freq: Three times a day (TID) | ORAL | Status: DC

## 2015-03-10 MED ORDER — ESCITALOPRAM OXALATE 20 MG PO TABS: 20.0000 mg | ORAL_TABLET | Freq: Every day | ORAL | Status: DC

## 2015-03-10 NOTE — Progress Notes (Signed)
Psychiatric Initial Adult Assessment   Patient Identification: Brandy [name] MRN:  [id number] Date of Evaluation:  03/10/2015 Referral Source: [facility] Chief Complaint: Depression and anxiety  Visit Diagnosis:    ICD-9-CM ICD-10-CM   1. Essential hypertension 401.9 I10   2. Severe episode of recurrent major depressive disorder, without psychotic features ([place]) 296.33 F33.2   3. Generalized anxiety disorder 300.02 F41.1    Diagnosis:   Patient Active Problem List   Diagnosis Date Noted  . Major depression, recurrent ([place]) [F33.9] 03/10/2015    Priority: High  . Generalized anxiety disorder [F41.1] 03/10/2015    Priority: High  . Dizziness and giddiness [R42] 02/20/2015  . GERD (gastroesophageal reflux disease) [K21.9] 02/20/2015  . Abnormal CXR [R93.8]   . Dyspnea [R06.00] 02/02/2015  . Hypoxia [R09.02] 02/02/2015  . Acute pulmonary edema ([place]) [J81.0] 02/02/2015  . Hypertensive urgency [I16.0] 02/02/2015  . Hypertensive crisis [I16.9]   . Nausea with vomiting [R11.2] 06/09/2011  . HTN (hypertension) [I10] 12/02/2010  . Diverticulitis of sigmoid colon [K57.32] 12/02/2010  . Tobacco dependence [F17.200] 12/02/2010   History of Present Illness: Patient is a 49 year old African-American married female mother of 3 children age 37 and 6. Patient works as a Electrical engineer at [employer]. Patient was hospitalized on 02/01/2015 at [facility] because of pulmonary edema and essential hypertension. She was discharged and has a follow-up appointment with Dr. Jarold Song. There has been some confusion about the medications she states that she ran out of some of her medications and did not get a refill.  Patient is here today because she states she is afraid of going to sleep for fear of dying. States that she is depressed anxious worry that she'll die in her sleep like her parents did. Her parents did not die in the sleep but they were not ill and her mother died  suddenly and followed a few months later by her father. Being hospitalized at [facility] has brought that fear and she was released constantly about it. Patient has become very irritable with the family members, unable to sleep appetite has increased she's been eating more lately. Depressed and irritated very anxious ruminates about dying has panic attacks. Patient is tearful states that she can't remember things tends to forget easily has normal energy no motivation and is anhedonic. Also feels hopeless and helpless denies suicidal ideation now homicidal ideation no hallucinations or delusions.  Today patient's blood pressure was 180/102, pulse was 79. Patient states she was 167 pounds when discharged from [facility] and today her weight is 179 pounds. Patient is concerned about. I tried reaching Dr. Farris Has unsuccessfully  Patient also stated that she was not taking her Lasix.  Associated Signs/Symptoms: Depression Symptoms:  depressed mood, anhedonia, insomnia, psychomotor retardation, fatigue, feelings of worthlessness/guilt, difficulty concentrating, hopelessness, impaired memory, anxiety, panic attacks, loss of energy/fatigue, weight gain, increased appetite, (Hypo) Manic Symptoms:  Distractibility, Irritable Mood, Anxiety Symptoms:  Excessive Worry, Panic Symptoms, Psychotic Symptoms:  None PTSD Symptoms: NA  Substance Abuse History in the last 12 months:  Yes.   patient smoked a pack of cigarettes per day from the age of 88 and quit a month ago. She drinks a glass of wine on weekends.   Consequences of Substance Abuse: Medical Consequences:  Pulmonary edema, essential hypertension   Past psychiatric history. Patient states her first episode of depression was as a Equities trader in high school. She did not receive any treatment. Her second episode of  depression was in 1999 after her mother died suddenly. She went to [facility] from 1990 12/05/1998 and was treated with  Zoloft and Wellbutrin and therapy.    Past Medical History: Recently hospitalized at [facility] on 02/01/2015 for essential hypertension and pulmonary edema. Past Medical History  Diagnosis Date  . Hypertension   . Osteoarthritis   . Depression   . Gallstones     Past Surgical History  Procedure Laterality Date  . Gallbladder surgery     Family History: Maternal grandmother had mental illness, mom had depression and alcohol problems. Family History  Problem Relation Age of Onset  . Coronary artery disease Paternal Grandmother     young age  . Hypertension Father   . Diabetes Sister   . Diabetes Paternal Grandmother    Social History:   she is presently married and lives with her husband and 26 year old daughter in [place]. Patient works as a Electrical engineer at [employer]  . Marital Status: Married    Spouse Name: N/A  . Number of Children: 3  . Years of Education: N/A   Occupational History  . Nurse [employer]   Social History Main Topics  . Smoking status: Current Every Day Smoker -- 0.50 packs/day for 1.5 years    Types: Cigarettes  . Smokeless tobacco: Never Used  . Alcohol Use: Yes     Comment: Weekends.   . Drug Use: No  . Sexual Activity: Yes   Other Topics Concern  . None   Social History Narrative   Additional Social History: Patient was born and raised in [place]. States she did well in school but was a disruptive child and so had to sit in the hallway most of the time. She graduated high school and joined Market [employer] became pregnant. Patient has 3 children. They have different fathers  Musculoskeletal: Strength & Muscle Tone: within normal limits Gait & Station: normal Patient leans: N/A and Stand straight  Psychiatric Specialty Exam: HPI  ROS  Blood pressure 180/102, pulse 79, height 5\' 2"  (1.575 m), weight 179 lb 3.2 oz (81.285 kg), last menstrual period 02/16/2015.Body mass index is  32.77 kg/(m^2).  General Appearance: Casual and Fairly Groomed  Eye Contact:  Fair  Speech:  Clear and Coherent and Normal Rate  Volume:  Decreased  Mood:  Anxious, Depressed, Dysphoric and Hopeless  Affect:  Constricted, Depressed and Tearful  Thought Process:  Goal Directed, Linear and Logical  Orientation:  Full (Time, Place, and Person)  Thought Content:  Rumination  Suicidal Thoughts:  No  Homicidal Thoughts:  No  Memory:  Immediate;   Fair Recent;   Good Remote;   Good  Judgement:  Good  Insight:  Good  Psychomotor Activity:  Normal  Concentration:  Fair  Recall:  [place] of Knowledge:Good  Language: Good  Akathisia:  No  Handed:  Right  AIMS (if indicated):  0  Assets:  Communication Skills Desire for Improvement Financial Resources/Insurance Housing Resilience Social Support Transportation Vocational/Educational  ADL's:  Intact  Cognition: WNL  Sleep:  Poor    Is the patient at risk to self?  No. Has the patient been a risk to self in the past 6 months?  No. Has the patient been a risk to self within the distant past?  No. Is the patient a risk to others?  No. Has the patient been a risk to others in the past 6 months?  No. Has  the patient been a risk to others within the distant past?  No.  Allergies:  As listed below Allergies  Allergen Reactions  . Azithromycin Other (See Comments)    "Stomach cramps"  . Contrast Media [Iodinated Diagnostic Agents] Hives  . 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"   Current Medications: Current Outpatient Prescriptions  Medication Sig Dispense Refill  . ALPRAZolam (XANAX) 0.25 MG tablet Take 1 tablet (0.25 mg total) by mouth 3 (three) times daily. 90 tablet 0  . aspirin EC 81 MG EC tablet Take 1 tablet (81 mg total) by mouth daily.    . carvedilol (COREG) 12.5 MG tablet Take 1 tablet (12.5 mg total) by mouth 2 (two) times daily with a meal. Discontinue 6.25mg  60 tablet 2  .  escitalopram (LEXAPRO) 20 MG tablet Take 1 tablet (20 mg total) by mouth daily. 30 tablet 2  . furosemide (LASIX) 20 MG tablet Take 1 tablet (20 mg total) by mouth daily. 30 tablet 0  . guaiFENesin (MUCINEX) 600 MG 12 hr tablet Take 600 mg by mouth 2 (two) times daily as needed (for cold symptoms).    . meclizine (ANTIVERT) 25 MG tablet Take 1 tablet (25 mg total) by mouth 3 (three) times daily as needed. 30 tablet 1  . pantoprazole (PROTONIX) 40 MG tablet Take 1 tablet (40 mg total) by mouth daily. 30 tablet 3  . potassium chloride SA (K-DUR,KLOR-CON) 20 MEQ tablet Take 1 tablet (20 mEq total) by mouth daily. 30 tablet 0  . UNABLE TO FIND This note is to excuse [name] from work due to medical illness requiring hospitalization, ok to return to work after 11/8 1 each 0  . [DISCONTINUED] promethazine (PHENERGAN) 25 MG suppository Place 1 suppository (25 mg total) rectally every 6 (six) hours as needed for nausea. 12 each 1   No current facility-administered medications for this visit.    Previous Psychotropic Medications: Yes     Medical Decision Making:  Review of Psycho-Social Stressors (1), Review or order clinical lab tests (1), Discuss test with performing physician (1), Established Problem, Worsening (2), New Problem, with no additional work-up planned (3), Review of Medication Regimen & Side Effects (2) and Review of New Medication or Change in Dosage (2)  Treatment Plan Summary: Medication management #1 major depression recurrent severe Patient will be started on Lexapro 20 mg by mouth every morning. I discussed the rationale risks benefits options and obtaining informed consent. #2 generalized anxiety disorder. Her Xanax will be changed to 0.25 mg by mouth 3 times a day to help her anxiety. Patient gave informed consent for this change. #3 discussed restarting the Lasix for her edema patient stated understanding and gave consent for that. She'll take a 20 mg tablet now and then will  take Lasix 10 mg twice a day till she sees her PCP. She'll continue her other medications with the present doses. #4 unresolved grief Regarding the death of her parents, patient was given reassurance that she will not die like her parents did. Patient has a very difficult time accepting reassurance. #5 labs None at this visit. #6 therapy None at this visit. #7 patient will return to see me in the clinic in 2 weeks if she is stable at this time will refer her to IOP. Patient can call sooner if necessary. #8 Tried reaching her PCP unsuccessfully.  This visit was of 60 minutes and was an initial assessment. Patient was seen today charts were  reviewed. More than 50% of the time was spent in counseling and care coordination and trying to reach her PCP unsuccessfully given her elevated blood pressure. Medication adjustments for her hypertension was done. Patient was asked to call her PCP ASAP. Interpersonal and supportive therapy and anxiety reduction techniques were discussed with the patient.   Erin Sons 12/6/20163:00 PM

## 2015-03-10 NOTE — Telephone Encounter (Signed)
potassium chloride SA (K-DUR,KLOR-CON) 20 MEQ tablet furosemide (LASIX) 20 MG tablet

## 2015-03-10 NOTE — Telephone Encounter (Signed)
Refilled medication and sent to patient preferred pharmacy.

## 2015-03-13 ENCOUNTER — Encounter: Payer: Self-pay | Admitting: Family Medicine

## 2015-03-13 ENCOUNTER — Ambulatory Visit: Payer: 59 | Attending: Family Medicine | Admitting: Family Medicine

## 2015-03-13 VITALS — BP 151/89 | HR 85 | Temp 98.6°F | Resp 16 | Ht 62.0 in | Wt 179.0 lb

## 2015-03-13 DIAGNOSIS — I10 Essential (primary) hypertension: Secondary | ICD-10-CM | POA: Diagnosis not present

## 2015-03-13 DIAGNOSIS — F17210 Nicotine dependence, cigarettes, uncomplicated: Secondary | ICD-10-CM | POA: Diagnosis not present

## 2015-03-13 DIAGNOSIS — R42 Dizziness and giddiness: Secondary | ICD-10-CM | POA: Diagnosis not present

## 2015-03-13 DIAGNOSIS — Z881 Allergy status to other antibiotic agents status: Secondary | ICD-10-CM | POA: Insufficient documentation

## 2015-03-13 DIAGNOSIS — G4700 Insomnia, unspecified: Secondary | ICD-10-CM | POA: Diagnosis not present

## 2015-03-13 DIAGNOSIS — Z79899 Other long term (current) drug therapy: Secondary | ICD-10-CM | POA: Insufficient documentation

## 2015-03-13 DIAGNOSIS — Z7982 Long term (current) use of aspirin: Secondary | ICD-10-CM | POA: Insufficient documentation

## 2015-03-13 DIAGNOSIS — F329 Major depressive disorder, single episode, unspecified: Secondary | ICD-10-CM | POA: Diagnosis not present

## 2015-03-13 DIAGNOSIS — J810 Acute pulmonary edema: Secondary | ICD-10-CM | POA: Insufficient documentation

## 2015-03-13 DIAGNOSIS — Z91013 Allergy to seafood: Secondary | ICD-10-CM | POA: Insufficient documentation

## 2015-03-13 MED ORDER — POTASSIUM CHLORIDE CRYS ER 20 MEQ PO TBCR: 20.0000 meq | EXTENDED_RELEASE_TABLET | Freq: Every day | ORAL | Status: DC

## 2015-03-13 MED ORDER — FUROSEMIDE 20 MG PO TABS
20.0000 mg | ORAL_TABLET | Freq: Every day | ORAL | Status: DC
Start: 2015-03-13 — End: 2015-04-09

## 2015-03-13 MED ORDER — CARVEDILOL 25 MG PO TABS: 25.0000 mg | ORAL_TABLET | Freq: Two times a day (BID) | ORAL | Status: DC

## 2015-03-13 NOTE — Progress Notes (Signed)
Patient here for 3 wk f/up. Patient reports feeling "all right" today.  Patient states she's still having issues with sleep apnea.  Patient requesting refill of Potassium pill.  Patient need her disability  forms reevaluated.  Patient states she's taken medication this morning.

## 2015-03-13 NOTE — Patient Instructions (Signed)
Hypertension Hypertension, commonly called high blood pressure, is when the force of blood pumping through your arteries is too strong. Your arteries are the blood vessels that carry blood from your heart throughout your body. A blood pressure reading consists of a higher number over a lower number, such as 110/72. The higher number (systolic) is the pressure inside your arteries when your heart pumps. The lower number (diastolic) is the pressure inside your arteries when your heart relaxes. Ideally you want your blood pressure below 120/80. Hypertension forces your heart to work harder to pump blood. Your arteries may become narrow or stiff. Having untreated or uncontrolled hypertension can cause heart attack, stroke, kidney disease, and other problems. RISK FACTORS Some risk factors for high blood pressure are controllable. Others are not.  Risk factors you cannot control include:   Race. You may be at higher risk if you are African American.  Age. Risk increases with age.  Gender. Men are at higher risk than women before age 45 years. After age 65, women are at higher risk than men. Risk factors you can control include:  Not getting enough exercise or physical activity.  Being overweight.  Getting too much fat, sugar, calories, or salt in your diet.  Drinking too much alcohol. SIGNS AND SYMPTOMS Hypertension does not usually cause signs or symptoms. Extremely high blood pressure (hypertensive crisis) may cause headache, anxiety, shortness of breath, and nosebleed. DIAGNOSIS To check if you have hypertension, your health care provider will measure your blood pressure while you are seated, with your arm held at the level of your heart. It should be measured at least twice using the same arm. Certain conditions can cause a difference in blood pressure between your right and left arms. A blood pressure reading that is higher than normal on one occasion does not mean that you need treatment. If  it is not clear whether you have high blood pressure, you may be asked to return on a different day to have your blood pressure checked again. Or, you may be asked to monitor your blood pressure at home for 1 or more weeks. TREATMENT Treating high blood pressure includes making lifestyle changes and possibly taking medicine. Living a healthy lifestyle can help lower high blood pressure. You may need to change some of your habits. Lifestyle changes may include:  Following the DASH diet. This diet is high in fruits, vegetables, and whole grains. It is low in salt, red meat, and added sugars.  Keep your sodium intake below 2,300 mg per day.  Getting at least 30-45 minutes of aerobic exercise at least 4 times per week.  Losing weight if necessary.  Not smoking.  Limiting alcoholic beverages.  Learning ways to reduce stress. Your health care provider may prescribe medicine if lifestyle changes are not enough to get your blood pressure under control, and if one of the following is true:  You are 18-59 years of age and your systolic blood pressure is above 140.  You are 60 years of age or older, and your systolic blood pressure is above 150.  Your diastolic blood pressure is above 90.  You have diabetes, and your systolic blood pressure is over 140 or your diastolic blood pressure is over 90.  You have kidney disease and your blood pressure is above 140/90.  You have heart disease and your blood pressure is above 140/90. Your personal target blood pressure may vary depending on your medical conditions, your age, and other factors. HOME CARE INSTRUCTIONS    Have your blood pressure rechecked as directed by your health care provider.   Take medicines only as directed by your health care provider. Follow the directions carefully. Blood pressure medicines must be taken as prescribed. The medicine does not work as well when you skip doses. Skipping doses also puts you at risk for  problems.  Do not smoke.   Monitor your blood pressure at home as directed by your health care provider. SEEK MEDICAL CARE IF:   You think you are having a reaction to medicines taken.  You have recurrent headaches or feel dizzy.  You have swelling in your ankles.  You have trouble with your vision. SEEK IMMEDIATE MEDICAL CARE IF:  You develop a severe headache or confusion.  You have unusual weakness, numbness, or feel faint.  You have severe chest or abdominal pain.  You vomit repeatedly.  You have trouble breathing. MAKE SURE YOU:   Understand these instructions.  Will watch your condition.  Will get help right away if you are not doing well or get worse.   This information is not intended to replace advice given to you by your health care provider. Make sure you discuss any questions you have with your health care provider.   Document Released: 03/21/2005 Document Revised: 08/05/2014 Document Reviewed: 01/11/2013 Elsevier Interactive Patient Education 2016 Elsevier Inc.  

## 2015-03-13 NOTE — Progress Notes (Signed)
CC:  HPI: Brandy [name] is a 49 y.o. female with a history of uncontrolled hypertension, recently hospitalized for acute pulmonary edema. Her last office visit she had complained of dizziness for which she was commenced on meclizine and she had a head CT which came back normal. She states that dizziness has resolved at this time. Her blood pressure still remains elevated and was 180/102 at her psychiatrist's office despite compliance with antihypertensives.  She complains that her request for short-term disability was denied due to problem with the paperwork. Complains of insomnia. She was out of Lasix and potassium and states she gained 15 pounds since the last office visit. Patient has No headache, No chest pain, No abdominal pain - No Nausea, No new weakness tingling or numbness, No Cough - SOB.  Allergies  Allergen Reactions  . Azithromycin Other (See Comments)    "Stomach cramps"  . Contrast Media [Iodinated Diagnostic Agents] Hives  . 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 Hypertension   . Osteoarthritis   . Depression   . Gallstones    Current Outpatient Prescriptions on File Prior to Visit  Medication Sig Dispense Refill  . ALPRAZolam (XANAX) 0.25 MG tablet Take 1 tablet (0.25 mg total) by mouth 3 (three) times daily. 90 tablet 0  . aspirin EC 81 MG EC tablet Take 1 tablet (81 mg total) by mouth daily.    Marland Kitchen escitalopram (LEXAPRO) 20 MG tablet Take 1 tablet (20 mg total) by mouth daily. 30 tablet 2  . guaiFENesin (MUCINEX) 600 MG 12 hr tablet Take 600 mg by mouth 2 (two) times daily as needed (for cold symptoms).    . meclizine (ANTIVERT) 25 MG tablet Take 1 tablet (25 mg total) by mouth 3 (three) times daily as needed. 30 tablet 1  . pantoprazole (PROTONIX) 40 MG tablet Take 1 tablet (40 mg total) by mouth daily. 30 tablet 3  . UNABLE TO FIND This note is to excuse Brandy [name] from work due to  medical illness requiring hospitalization, ok to return to work after 11/8 1 each 0  . [DISCONTINUED] promethazine (PHENERGAN) 25 MG suppository Place 1 suppository (25 mg total) rectally every 6 (six) hours as needed for nausea. 12 each 1   No current facility-administered medications on file prior to visit.   Family History  Problem Relation Age of Onset  . Coronary artery disease Paternal Grandmother     young age  . Hypertension Father   . Diabetes Sister   . Diabetes Paternal Grandmother    Social History   Social History  . Marital Status: Married    Spouse Name: N/A  . Number of Children: 3  . Years of Education: N/A   Occupational History  . Nurse [employer]   Social History Main Topics  . Smoking status: Current Every Day Smoker -- 0.50 packs/day for 1.5 years    Types: Cigarettes  . Smokeless tobacco: Never Used  . Alcohol Use: Yes     Comment: Weekends.   . Drug Use: No  . Sexual Activity: Yes   Other Topics Concern  . Not on file   Social History Narrative    Review of Systems: Constitutional: Negative for fever, chills, diaphoresis, activity change, appetite change and fatigue. HENT: Negative for ear pain, nosebleeds, congestion, facial swelling, rhinorrhea, neck pain, neck stiffness and ear discharge.  Eyes: Negative for pain, discharge, redness, itching  and visual disturbance. Respiratory: Negative for cough, choking, chest tightness, shortness of breath, wheezing and stridor.  Cardiovascular: Negative for chest pain, palpitations and leg swelling. Gastrointestinal: Negative for abdominal distention. Genitourinary: Negative for dysuria, urgency, frequency, hematuria, flank pain, decreased urine volume, difficulty urinating and dyspareunia.  Musculoskeletal: Negative for back pain, joint swelling, arthralgias and gait problem. Neurological: Negative for dizziness, tremors, seizures, syncope, facial asymmetry, speech difficulty, weakness,  light-headedness, numbness and headaches.  Hematological: Negative for adenopathy. Does not bruise/bleed easily. Psychiatric/Behavioral: Negative for hallucinations, behavioral problems, confusion, dysphoric mood, decreased concentration and agitation.    Objective:   Filed Vitals:   03/13/15 1451  BP: 151/89  Pulse: 85  Temp: 98.6 F (37 C)  Resp: 16    Physical Exam: Constitutional: Patient appears well-developed and well-nourished. No distress. HENT: Normocephalic, atraumatic, External right and left ear normal. Oropharynx is clear and moist.  Eyes: Conjunctivae and EOM are normal. PERRLA, no scleral icterus. Neck: Normal ROM. Neck supple. No JVD. No tracheal deviation. No thyromegaly. CVS: RRR, S1/S2 +, no murmurs, no gallops, no carotid bruit.  Pulmonary: Effort and breath sounds normal, no stridor, rhonchi, wheezes, rales.  Abdominal: Soft. BS +,  no distension, tenderness, rebound or guarding.  Musculoskeletal: Normal range of motion. No edema and no tenderness.  Lymphadenopathy: No lymphadenopathy noted, cervical, inguinal or axillary Neuro: Alert. Normal reflexes, muscle tone coordination. No cranial nerve deficit. Skin: Skin is warm and dry. No rash noted. Not diaphoretic. No erythema. No pallor. Psychiatric: Normal mood and affect. Behavior, judgment, thought content normal.  Lab Results  Component Value Date   WBC 5.6 02/05/2015   HGB 13.1 02/05/2015   HCT 40.6 02/05/2015   MCV 86.4 02/05/2015   PLT 217 02/05/2015   Lab Results  Component Value Date   CREATININE 0.91 02/20/2015   BUN 16 02/20/2015   NA 140 02/20/2015   K 4.4 02/20/2015   CL 103 02/20/2015   CO2 27 02/20/2015    Lab Results  Component Value Date   HGBA1C 5.7* 02/04/2015   Lipid Panel     Component Value Date/Time   CHOL 176 02/03/2015 0420   TRIG 121 02/03/2015 0420   HDL 56 02/03/2015 0420   CHOLHDL 3.1 02/03/2015 0420   VLDL 24 02/03/2015 0420   LDLCALC 96 02/03/2015 0420        Assessment and plan:  Hypertension: Uncontrolled. Increase carvedilol from 12.5 mg to 25 mg twice daily. Advised and low-sodium, DASH diet.  Pulmonary edema: She has been out of Lasix for the last 3 days and gained about 15 pounds. I will send off a chest x-ray and natruretic peptide to evaluate for this. Advised to call the disability company to find out the specific problem with the paperwork so he can be corrected.  Insomnia: She is currently on Xanax which she receives from her psychiatrist and meclizine which should both be sedating. I would love to place her on trazodone but will defer this to psychiatry and I have advised her to discuss this with her psychiatrist.  This note has been created with Dragon speech recognition software and smart phrase technology. Any transcriptional errors are unintentional.         Arnoldo Morale, MD. [facility] [phone] 03/13/2015, 3:18 PM

## 2015-03-14 LAB — BRAIN NATRIURETIC PEPTIDE: Brain Natriuretic Peptide: 26.4 pg/mL (ref 0.0–100.0)

## 2015-03-17 ENCOUNTER — Telehealth: Payer: Self-pay

## 2015-03-17 NOTE — Telephone Encounter (Signed)
-----   Message from Arnoldo Morale, MD sent at 03/16/2015  8:37 AM EST ----- Please inform the patient that labs are normal. Thank you.

## 2015-03-17 NOTE — Telephone Encounter (Signed)
CMA called patient, patient verified name and DOB. Patient was given lab results, verbalized she understood with no further questions.

## 2015-03-24 ENCOUNTER — Ambulatory Visit (HOSPITAL_COMMUNITY): Payer: Self-pay | Admitting: Psychiatry

## 2015-04-03 ENCOUNTER — Ambulatory Visit: Payer: Self-pay | Admitting: Family Medicine

## 2015-04-09 ENCOUNTER — Telehealth: Payer: Self-pay | Admitting: Family Medicine

## 2015-04-09 MED ORDER — FUROSEMIDE 20 MG PO TABS: 20.0000 mg | ORAL_TABLET | Freq: Every day | ORAL | Status: DC

## 2015-04-09 MED ORDER — MECLIZINE HCL 25 MG PO TABS: 25.0000 mg | ORAL_TABLET | Freq: Three times a day (TID) | ORAL | Status: DC | PRN

## 2015-04-09 NOTE — Telephone Encounter (Signed)
Refills sent

## 2015-04-22 ENCOUNTER — Telehealth: Payer: Self-pay | Admitting: Family Medicine

## 2015-04-22 NOTE — Telephone Encounter (Signed)
Patient is requesting medication refill on the following: Xanax, potassium, antivert.Marland KitchenMarland KitchenMarland KitchenMarland Kitchenplease send to Outpatient Pharmacy.Marland KitchenMarland KitchenMarland Kitchen

## 2015-04-23 NOTE — Telephone Encounter (Signed)
Spoke to patient and verified name and date of birth.  Told patient she already had refills on the potassium and meclizine tablets.  Explained that she would need to follow up with her psychiatrist for additional Xanax refills

## 2015-04-29 ENCOUNTER — Ambulatory Visit: Payer: 59 | Attending: Family Medicine | Admitting: Family Medicine

## 2015-04-29 ENCOUNTER — Telehealth (HOSPITAL_COMMUNITY): Payer: Self-pay

## 2015-04-29 ENCOUNTER — Encounter: Payer: Self-pay | Admitting: Family Medicine

## 2015-04-29 VITALS — BP 135/90 | HR 82 | Temp 99.0°F | Resp 15 | Ht 62.0 in | Wt 188.4 lb

## 2015-04-29 DIAGNOSIS — Z131 Encounter for screening for diabetes mellitus: Secondary | ICD-10-CM | POA: Diagnosis not present

## 2015-04-29 DIAGNOSIS — F3162 Bipolar disorder, current episode mixed, moderate: Secondary | ICD-10-CM | POA: Diagnosis not present

## 2015-04-29 DIAGNOSIS — R42 Dizziness and giddiness: Secondary | ICD-10-CM | POA: Diagnosis not present

## 2015-04-29 DIAGNOSIS — Z79899 Other long term (current) drug therapy: Secondary | ICD-10-CM | POA: Insufficient documentation

## 2015-04-29 DIAGNOSIS — K219 Gastro-esophageal reflux disease without esophagitis: Secondary | ICD-10-CM | POA: Insufficient documentation

## 2015-04-29 DIAGNOSIS — R635 Abnormal weight gain: Secondary | ICD-10-CM

## 2015-04-29 DIAGNOSIS — F319 Bipolar disorder, unspecified: Secondary | ICD-10-CM | POA: Diagnosis not present

## 2015-04-29 DIAGNOSIS — I10 Essential (primary) hypertension: Secondary | ICD-10-CM | POA: Diagnosis not present

## 2015-04-29 DIAGNOSIS — J810 Acute pulmonary edema: Secondary | ICD-10-CM | POA: Diagnosis not present

## 2015-04-29 DIAGNOSIS — Z7982 Long term (current) use of aspirin: Secondary | ICD-10-CM | POA: Diagnosis not present

## 2015-04-29 LAB — BASIC METABOLIC PANEL
BUN: 16 mg/dL (ref 7–25)
CHLORIDE: 100 mmol/L (ref 98–110)
CO2: 26 mmol/L (ref 20–31)
CREATININE: 0.93 mg/dL (ref 0.50–1.10)
Calcium: 9.2 mg/dL (ref 8.6–10.2)
Glucose, Bld: 100 mg/dL — ABNORMAL HIGH (ref 65–99)
Potassium: 4.6 mmol/L (ref 3.5–5.3)
SODIUM: 138 mmol/L (ref 135–146)

## 2015-04-29 LAB — POCT GLYCOSYLATED HEMOGLOBIN (HGB A1C): HEMOGLOBIN A1C: 5.7

## 2015-04-29 MED ORDER — PANTOPRAZOLE SODIUM 40 MG PO TBEC: 40.0000 mg | DELAYED_RELEASE_TABLET | Freq: Every day | ORAL | Status: DC

## 2015-04-29 MED ORDER — POTASSIUM CHLORIDE CRYS ER 20 MEQ PO TBCR: 20.0000 meq | EXTENDED_RELEASE_TABLET | Freq: Every day | ORAL | Status: DC

## 2015-04-29 MED ORDER — ALPRAZOLAM 0.25 MG PO TABS: 0.2500 mg | ORAL_TABLET | Freq: Three times a day (TID) | ORAL | Status: DC

## 2015-04-29 MED ORDER — FUROSEMIDE 20 MG PO TABS: 20.0000 mg | ORAL_TABLET | Freq: Every day | ORAL | Status: DC

## 2015-04-29 NOTE — Progress Notes (Signed)
Subjective:  Patient ID: Brandy [name], female    DOB: 1965-08-25  Age: 50 y.o. MRN: [id number]  CC: Hypertension   HPI Brandy [name] with a history of hypertension, bipolar disorder hospitalized for acute pulmonary edema in 01/2015 for which she remains on Lasix. She continues to complain of feeling bloated and almost "drowning"and is fearful that she has fluid in her lungs. Chest x-ray ordered at her previous visit came back normal and her BNP was normal so well. 2-D echo from 01/2015 revealed ejection fraction of 99991111, grade 1 diastolic dysfunction. She has gained weight steadily and has gained 9 pounds in the last 6 weeks despite remaining on Lasix but denies pedal edema or shortness of breath.  She takes meclizine for occasional dizziness; complains that this morning she had an episode of dizziness around 6 AM on waking up and this was associated with nausea and flushing and symptoms resolved over 2 hours. Her blood pressure was initially elevated at 160/102 but after she had sudden the exam room for a few minutes and I repeatedly manually blood pressure was down to 135/90.  Complains of still feeling on edge and thinks Lexapro is not working. She has also run out of her Xanax but review of chart indicates behavioral health had done her prescription which she is yet to [facility]. Outpatient Prescriptions Prior to Visit  Medication Sig Dispense Refill  . ALPRAZolam (XANAX) 0.25 MG tablet Take 1 tablet (0.25 mg total) by mouth 3 (three) times daily. 90 tablet 0  . aspirin EC 81 MG EC tablet Take 1 tablet (81 mg total) by mouth daily.    . carvedilol (COREG) 25 MG tablet Take 1 tablet (25 mg total) by mouth 2 (two) times daily with a meal. Discontinue 6.25mg  60 tablet 2  . escitalopram (LEXAPRO) 20 MG tablet Take 1 tablet (20 mg total) by mouth daily. 30 tablet 2  . guaiFENesin (MUCINEX) 600 MG 12 hr tablet Take 600 mg by mouth 2 (two) times daily as needed (for cold symptoms).      . meclizine (ANTIVERT) 25 MG tablet Take 1 tablet (25 mg total) by mouth 3 (three) times daily as needed. 30 tablet 1  . furosemide (LASIX) 20 MG tablet Take 1 tablet (20 mg total) by mouth daily. 30 tablet 2  . pantoprazole (PROTONIX) 40 MG tablet Take 1 tablet (40 mg total) by mouth daily. 30 tablet 3  . potassium chloride SA (K-DUR,KLOR-CON) 20 MEQ tablet Take 1 tablet (20 mEq total) by mouth daily. 30 tablet 2  . ALPRAZolam (XANAX) 0.25 MG tablet Take 1 tablet (0.25 mg total) by mouth 3 (three) times daily. 90 tablet 0  . UNABLE TO FIND This note is to excuse [name] from work due to medical illness requiring hospitalization, ok to return to work after 11/8 1 each 0   No facility-administered medications prior to visit.    ROS Review of Systems  Constitutional: Positive for unexpected weight change. Negative for activity change, appetite change and fatigue.  HENT: Negative for congestion, sinus pressure and sore throat.   Eyes: Negative for visual disturbance.  Respiratory: Negative for cough, chest tightness, shortness of breath and wheezing.   Cardiovascular: Negative for chest pain and palpitations.  Gastrointestinal: Negative for abdominal pain, constipation and abdominal distention.  Endocrine: Negative for polydipsia.  Genitourinary: Negative for dysuria and frequency.  Musculoskeletal: Negative for back pain and arthralgias.  Skin: Negative for rash.  Neurological: Negative for tremors, light-headedness and numbness.  Hematological: Does not bruise/bleed easily.  Psychiatric/Behavioral: Negative for behavioral problems and agitation. The patient is nervous/anxious.     Objective:  BP 135/90 mmHg  [name] 82  Temp(Src) 99 F (37.2 C)  Resp 15  Ht 5\' 2"  (1.575 m)  Wt 188 lb 6.4 oz (85.458 kg)  BMI 34.45 kg/m2  SpO2 99%  BP/Weight 04/29/2015 03/13/2015 99991111  Systolic BP [id number] [id number]  Diastolic BP 90 89 [id number]  Wt. (Lbs) 188.4 179 179.2  BMI 34.45 32.73 32.77       Physical Exam  Constitutional: She is oriented to person, place, and time. She appears well-developed and well-nourished.  Cardiovascular: Normal rate, normal heart sounds and intact distal pulses.   No murmur heard. Pulmonary/Chest: Effort normal and breath sounds normal. She has no wheezes. She has no rales. She exhibits no tenderness.  Abdominal: Soft. Bowel sounds are normal. She exhibits no distension and no mass. There is no tenderness.  Musculoskeletal: Normal range of motion.  Neurological: She is alert and oriented to person, place, and time.     Assessment & Plan:   1. Diabetes mellitus screening A1c 5.7-normal - HgB A1c  2. Essential hypertension Initial blood pressure was elevated and repeat done manually was normal Continue carvedilol  3. Bipolar disorder, current episode mixed, moderate (HCC) Currently on Lexapro and Xanax. Patient states symptoms are not controlled on Lexapro. Advised to keep appointment with behavioral health  4. Acute pulmonary edema (HCC) Clinical exam is normal.  We'll send a BNP in the light of persisting weight gain. Patient advised to take an extra 20 mg of Lasix if she feels she is retaining fluid - Basic Metabolic Panel - furosemide (LASIX) 20 MG tablet; Take 1 tablet (20 mg total) by mouth daily.  Dispense: 90 tablet; Refill: 0 - Brain natriuretic peptide  5. Gastroesophageal reflux disease without esophagitis Controlled - pantoprazole (PROTONIX) 40 MG tablet; Take 1 tablet (40 mg total) by mouth daily.  Dispense: 90 tablet; Refill: 0  6. Weight gain Advised on increasing physical activity and reducing portion sizes.   Meds ordered this encounter  Medications  . furosemide (LASIX) 20 MG tablet    Sig: Take 1 tablet (20 mg total) by mouth daily.    Dispense:  90 tablet    Refill:  0  . potassium chloride SA (K-DUR,KLOR-CON) 20 MEQ tablet    Sig: Take 1 tablet (20 mEq total) by mouth daily.    Dispense:  90 tablet     Refill:  0  . pantoprazole (PROTONIX) 40 MG tablet    Sig: Take 1 tablet (40 mg total) by mouth daily.    Dispense:  90 tablet    Refill:  0    Follow-up: No Follow-up on file.   Arnoldo Morale MD

## 2015-04-29 NOTE — Telephone Encounter (Signed)
I called the patient and lvm that prescription was up front and ready for pick up

## 2015-04-29 NOTE — Telephone Encounter (Signed)
Medication refill request - Telephone message from patient stating need for a refill of Alprazolam, last provided on 03/10/15 and patient returns on 05/04/15.  Requests order be called into [facility].

## 2015-04-29 NOTE — Telephone Encounter (Signed)
04/28/14 3:30pm Amadeo Garnet [id number] pick-up rx script.Marland KitchenMariana Kaufman

## 2015-04-29 NOTE — Progress Notes (Signed)
Patient here for follow up She has taken all meds She does see Psychiatrist at behavioral health on the 30th for follow up

## 2015-04-29 NOTE — Telephone Encounter (Signed)
90 tablets given with 0 refills on 1-20 5-17

## 2015-04-29 NOTE — Patient Instructions (Signed)
Obesity Obesity is defined as having too much total body fat and a body mass index (BMI) of 30 or more. BMI is an estimate of body fat and is calculated from your height and weight. BMI is typically calculated by your health care provider during regular wellness visits. Obesity happens when you consume more calories than you can burn by exercising or performing daily physical tasks. Prolonged obesity can cause major illnesses or emergencies, such as:  Stroke.  Heart disease.  Diabetes.  Cancer.  Arthritis.  High blood pressure (hypertension).  High cholesterol.  Sleep apnea.  Erectile dysfunction.  Infertility problems. CAUSES   Regularly eating unhealthy foods.  Physical inactivity.  Certain disorders, such as an underactive thyroid (hypothyroidism), Cushing's syndrome, and polycystic ovarian syndrome.  Certain medicines, such as steroids, some depression medicines, and antipsychotics.  Genetics.  Lack of sleep. DIAGNOSIS A health care provider can diagnose obesity after calculating your BMI. Obesity will be diagnosed if your BMI is 30 or higher. There are other methods of measuring obesity levels. Some other methods include measuring your skinfold thickness, your waist circumference, and comparing your hip circumference to your waist circumference. TREATMENT  A healthy treatment program includes some or all of the following:  Long-term dietary changes.  Exercise and physical activity.  Behavioral and lifestyle changes.  Medicine only under the supervision of your health care provider. Medicines may help, but only if they are used with diet and exercise programs. If your BMI is 40 or higher, your health care provider may recommend specialized surgery or programs to help with weight loss. An unhealthy treatment program includes:  Fasting.  Fad diets.  Supplements and drugs. These choices do not succeed in long-term weight control. HOME CARE  INSTRUCTIONS  Exercise and perform physical activity as directed by your health care provider. To increase physical activity, try the following:  Use stairs instead of elevators.  Park farther away from store entrances.  Garden, bike, or walk instead of watching television or using the computer.  Eat healthy, low-calorie foods and drinks on a regular basis. Eat more fruits and vegetables. Use low-calorie cookbooks or take healthy cooking classes.  Limit fast food, sweets, and processed snack foods.  Eat smaller portions.  Keep a daily journal of everything you eat. There are many free websites to help you with this. It may be helpful to measure your foods so you can determine if you are eating the correct portion sizes.  Avoid drinking alcohol. Drink more water and drinks without calories.  Take vitamins and supplements only as recommended by your health care provider.  Weight-loss support groups, registered dietitians, counselors, and stress reduction education can also be very helpful. SEEK IMMEDIATE MEDICAL CARE IF:  You have chest pain or tightness.  You have trouble breathing or feel short of breath.  You have weakness or leg numbness.  You feel confused or have trouble talking.  You have sudden changes in your vision.   This information is not intended to replace advice given to you by your health care provider. Make sure you discuss any questions you have with your health care provider.   Document Released: 04/28/2004 Document Revised: 04/11/2014 Document Reviewed: 04/27/2011 Elsevier Interactive Patient Education 2016 Elsevier Inc.  

## 2015-04-30 ENCOUNTER — Telehealth: Payer: Self-pay | Admitting: *Deleted

## 2015-04-30 NOTE — Telephone Encounter (Signed)
Results given to patient

## 2015-04-30 NOTE — Telephone Encounter (Signed)
-----   Message from Arnoldo Morale, MD sent at 04/30/2015 12:54 PM EST ----- Please inform the patient that labs are normal. Thank you.

## 2015-05-04 ENCOUNTER — Ambulatory Visit (INDEPENDENT_AMBULATORY_CARE_PROVIDER_SITE_OTHER): Payer: 59 | Admitting: Psychiatry

## 2015-05-04 ENCOUNTER — Encounter (HOSPITAL_COMMUNITY): Payer: Self-pay | Admitting: Psychiatry

## 2015-05-04 VITALS — BP 126/82 | HR 86 | Ht 61.0 in | Wt 187.0 lb

## 2015-05-04 DIAGNOSIS — F331 Major depressive disorder, recurrent, moderate: Secondary | ICD-10-CM

## 2015-05-04 DIAGNOSIS — F411 Generalized anxiety disorder: Secondary | ICD-10-CM | POA: Diagnosis not present

## 2015-05-04 MED ORDER — ESCITALOPRAM OXALATE 20 MG PO TABS: 20.0000 mg | ORAL_TABLET | Freq: Every day | ORAL | Status: DC

## 2015-05-04 MED ORDER — ALPRAZOLAM 0.25 MG PO TABS: 0.2500 mg | ORAL_TABLET | Freq: Three times a day (TID) | ORAL | Status: DC

## 2015-05-04 MED ORDER — RISPERIDONE 0.25 MG PO TABS: 0.2500 mg | ORAL_TABLET | Freq: Two times a day (BID) | ORAL | Status: DC

## 2015-05-04 NOTE — Progress Notes (Signed)
[facility] MD Progress Note  Patient Identification: Brandy [name] MRN:  [id number] Date of Evaluation:  05/04/2015 Referral Source: [facility] Chief Complaint: Depression and anxiety  Visit Diagnosis:    ICD-9-CM ICD-10-CM   1. Moderate episode of recurrent major depressive disorder (HCC) 296.32 F33.1   2. Generalized anxiety disorder 300.02 F41.1    Diagnosis:   Patient Active Problem List   Diagnosis Date Noted  . Major depression, recurrent ([place]) [F33.9] 03/10/2015    Priority: High  . Generalized anxiety disorder [F41.1] 03/10/2015    Priority: High  . Bipolar disorder ([place]) [F31.9] 04/29/2015  . Insomnia [G47.00] 03/13/2015  . Dizziness and giddiness [R42] 02/20/2015  . GERD (gastroesophageal reflux disease) [K21.9] 02/20/2015  . Abnormal CXR [R93.8]   . Dyspnea [R06.00] 02/02/2015  . Hypoxia [R09.02] 02/02/2015  . Acute pulmonary edema ([place]) [J81.0] 02/02/2015  . Hypertensive urgency [I16.0] 02/02/2015  . Hypertensive crisis [I16.9]   . Nausea with vomiting [R11.2] 06/09/2011  . HTN (hypertension) [I10] 12/02/2010  . Diverticulitis of sigmoid colon [K57.32] 12/02/2010  . Tobacco dependence [F17.200] 12/02/2010   History of Present Illness:-------------patient seen today for medication follow-up, state she missed her last appointment and so was off of the Lexapro for couple weeks. She felt it's not working but then restarted it a week ago. Continues to feel depressed now energy no motivation. Her blood pressure is normal, at work she had a panic attack. Encouraged her to keep 2 Xanax pills with her at all times and to take it if she has a panic attack she stated understanding.   States her sleep is disturbed, appetite has increased she states that she has gained some weight.encouraged patient exercise she states she walks on the unit but encouraged her to walk for another 20 minutes after she is done with work she stated understanding. At times feels hopeless and  helpless tends to get irritated with her husband and her daughter because they don't understand her feelings. Denies suicidal or homicidal ideation denies hallucinations or delusions.  I discussed the rationale risks benefits options of Risperdal 0.25 mg twice a day for mood stabilization and patient gave informed consent.  I had recommended IOP last time but patient did not start IOP. Discussed that if she does not feel better in 2 weeks she'll need to start IOP.                                                                       Notes from initial visit on 03/10/15   Patient is a 50 year old African-American married female mother of 3 children age 76 and 53. Patient works as a Electrical engineer at [employer]. Patient was hospitalized on 02/01/2015 at [facility] because of pulmonary edema and essential hypertension. She was discharged and has a follow-up appointment with Dr. Jarold Song. There has been some confusion about the medications she states that she ran out of some of her medications and did not get a refill.Patient is here today because she states she is afraid of going to sleep for fear of dying. States that she is depressed anxious worry that she'll die in her sleep like her parents did. Her parents did not die in the sleep but they were  not ill and her mother died suddenly and followed a few months later by her father. Being hospitalized at [facility] has brought that fear and she was released constantly about it. Patient has become very irritable with the family members, unable to sleep appetite has increased she's been eating more lately. Depressed and irritated very anxious ruminates about dying has panic attacks. Patient is tearful states that she can't remember things tends to forget easily has normal energy no motivation and is anhedonic. Also feels hopeless and helpless denies suicidal ideation now homicidal ideation no hallucinations or delusions.Today patient's blood  pressure was 180/102, pulse was 79. Patient states she was 167 pounds when discharged from [facility] and today her weight is 179 pounds. Patient is concerned about. I tried reaching Dr. Farris Has unsuccessfullyPatient also stated that she was not taking her Lasix.   Substance Abuse History in the last 12 months:  Yes.   patient smoked a pack of cigarettes per day from the age of 14 and quit a month ago. She drinks a glass of wine on weekends.   Consequences of Substance Abuse: Medical Consequences:  Pulmonary edema, essential hypertension   Past psychiatric history. Patient states her first episode of depression was as a Equities trader in high school. She did not receive any treatment. Her second episode of depression was in 08/01/1997 after her mother died suddenly. She went to [facility] from 08/01/88 12/05/1998 and was treated with Zoloft and Wellbutrin and therapy.    Past Medical History: Recently hospitalized at [facility] on 02/01/2015 for essential hypertension and pulmonary edema. Past Medical History  Diagnosis Date  . Hypertension   . Osteoarthritis   . Depression   . Gallstones     Past Surgical History  Procedure Laterality Date  . Gallbladder surgery     Family History: Maternal grandmother had mental illness, mom had depression and alcohol problems. Family History  Problem Relation Age of Onset  . Coronary artery disease Paternal Grandmother     young age  . Hypertension Father   . Diabetes Sister   . Diabetes Paternal Grandmother    Social History:   she is presently married and lives with her husband and 58 year old daughter in [place]. Patient works as a Electrical engineer at [employer]  . Marital Status: Married    Spouse Name: N/A  . Number of Children: 3  . Years of Education: N/A   Occupational History  . Nurse [employer]   Social History Main Topics  . Smoking status: Former Smoker -- 0.50 packs/day for 1.5  years    Types: Cigarettes    Quit date: 01/03/2015  . Smokeless tobacco: Never Used  . Alcohol Use: Yes     Comment: Weekends.   . Drug Use: No  . Sexual Activity: Yes   Other Topics Concern  . None   Social History Narrative   Additional Social History: Patient was born and raised in [place]. States she did well in school but was a disruptive child and so had to sit in the hallway most of the time. She graduated high school and joined [employer] became pregnant. Patient has 3 children. They have different fathers  Musculoskeletal: Strength & Muscle Tone: within normal limits Gait & Station: normal Patient leans: N/A and Stand straight  Psychiatric Specialty Exam: HPI  ROS  Blood pressure 126/82, pulse 86, height 5\' 1"  (1.549 m), weight 187 lb (84.823 kg), last  menstrual period 04/13/2015.Body mass index is 35.35 kg/(m^2).  General Appearance: Casual and Fairly Groomed  Eye Contact:  Fair  Speech:  Clear and Coherent and Normal Rate  Volume:  Decreased  Mood:  Anxious, Depressed, Dysphoric and Hopeless  Affect:  Constricted, Depressed and Tearful  Thought Process:  Goal Directed, Linear and Logical  Orientation:  Full (Time, Place, and Person)  Thought Content:  Rumination  Suicidal Thoughts:  No  Homicidal Thoughts:  No  Memory:  Immediate;   Fair Recent;   Good Remote;   Good  Judgement:  Good  Insight:  Good  Psychomotor Activity:  Normal  Concentration:  Fair  Recall:  [place] of Knowledge:Good  Language: Good  Akathisia:  No  Handed:  Right  AIMS (if indicated):  0  Assets:  Communication Skills Desire for Improvement Financial Resources/Insurance Housing Resilience Social Support Transportation Vocational/Educational  ADL's:  Intact  Cognition: WNL  Sleep:  Poor    Is the patient at risk to self?  No. Has the patient been a risk to self in the past 6 months?  No. Has the patient been a risk to self within the distant past?  No. Is the  patient a risk to others?  No. Has the patient been a risk to others in the past 6 months?  No. Has the patient been a risk to others within the distant past?  No.  Allergies:  As listed below Allergies  Allergen Reactions  . Azithromycin Other (See Comments)    "Stomach cramps"  . Contrast Media [Iodinated Diagnostic Agents] Hives  . 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"   Current Medications: Current Outpatient Prescriptions  Medication Sig Dispense Refill  . ALPRAZolam (XANAX) 0.25 MG tablet Take 1 tablet (0.25 mg total) by mouth 3 (three) times daily. 90 tablet 0  . aspirin EC 81 MG EC tablet Take 1 tablet (81 mg total) by mouth daily.    . carvedilol (COREG) 25 MG tablet Take 1 tablet (25 mg total) by mouth 2 (two) times daily with a meal. Discontinue 6.25mg  60 tablet 2  . escitalopram (LEXAPRO) 20 MG tablet Take 1 tablet (20 mg total) by mouth daily. 30 tablet 2  . furosemide (LASIX) 20 MG tablet Take 1 tablet (20 mg total) by mouth daily. 90 tablet 0  . guaiFENesin (MUCINEX) 600 MG 12 hr tablet Take 600 mg by mouth 2 (two) times daily as needed (for cold symptoms).    . meclizine (ANTIVERT) 25 MG tablet Take 1 tablet (25 mg total) by mouth 3 (three) times daily as needed. 30 tablet 1  . pantoprazole (PROTONIX) 40 MG tablet Take 1 tablet (40 mg total) by mouth daily. 90 tablet 0  . potassium chloride SA (K-DUR,KLOR-CON) 20 MEQ tablet Take 1 tablet (20 mEq total) by mouth daily. 90 tablet 0  . [DISCONTINUED] promethazine (PHENERGAN) 25 MG suppository Place 1 suppository (25 mg total) rectally every 6 (six) hours as needed for nausea. 12 each 1   No current facility-administered medications for this visit.    Previous Psychotropic Medications: Yes     Medical Decision Making:  Review of Psycho-Social Stressors (1), Review or order clinical lab tests (1), Discuss test with performing physician (1), Established Problem, Worsening (2), New  Problem, with no additional work-up planned (3), Review of Medication Regimen & Side Effects (2) and Review of New Medication or Change in Dosage (2)  Treatment Plan Summary: Medication management #  1 major depression recurrent severe Start Risperdal 0.25 mg by mouth twice a dayI discussed the rationale risks benefits options and obtaining informed consent. Continue Lexapro 20 mg by mouth every morning. #2 generalized anxiety disorder. Treat with Risperdal 0.25 mg po bid  Continue Xanax to 0.25 mg by mouth 3 times a day to help her anxiety.  #3 unresolved grief Regarding the death of her parents, patient was given reassurance that she will not die like her parents did. Patient has a very difficult time accepting reassurance. #4 labs None at this visit. #5 therapy None at this visit. #6 patient will return to see me in the clinic in 2 weeks if she is stable at this time will refer her to IOP. Patient can call sooner if necessary.   This was a 25 minute visit of high density. More than 50% of the time was spent in counseling . Discussing medications and emphasizing med compliance. Interpersonal and supportive therapy was provided along with cognitive behavior therapy. For anxiety and depression Erin Sons 1/30/20172:53 PM

## 2015-05-21 ENCOUNTER — Encounter (HOSPITAL_COMMUNITY): Payer: Self-pay | Admitting: Psychiatry

## 2015-05-21 ENCOUNTER — Ambulatory Visit (INDEPENDENT_AMBULATORY_CARE_PROVIDER_SITE_OTHER): Payer: 59 | Admitting: Psychiatry

## 2015-05-21 ENCOUNTER — Other Ambulatory Visit: Payer: Self-pay | Admitting: Family Medicine

## 2015-05-21 VITALS — BP 158/92 | HR 75 | Ht 62.0 in | Wt 191.6 lb

## 2015-05-21 DIAGNOSIS — F411 Generalized anxiety disorder: Secondary | ICD-10-CM

## 2015-05-21 DIAGNOSIS — F331 Major depressive disorder, recurrent, moderate: Secondary | ICD-10-CM

## 2015-05-21 MED ORDER — ARIPIPRAZOLE 5 MG PO TABS: 5.0000 mg | ORAL_TABLET | Freq: Every day | ORAL | Status: DC

## 2015-05-21 MED ORDER — ESCITALOPRAM OXALATE 20 MG PO TABS: 30.0000 mg | ORAL_TABLET | Freq: Every day | ORAL | Status: DC

## 2015-05-21 NOTE — Progress Notes (Signed)
[facility] MD Progress Note  Patient Identification: Brandy [name] MRN:  [id number] Date of Evaluation:  05/21/2015   Subjective--- I feel depressed   Visit Diagnosis:    ICD-9-CM ICD-10-CM   1. Generalized anxiety disorder 300.02 F41.1   2. Moderate episode of recurrent major depressive disorder (HCC) 296.32 F33.1    Diagnosis:   Patient Active Problem List   Diagnosis Date Noted  . Major depression, recurrent ([place]) [F33.9] 03/10/2015    Priority: High  . Generalized anxiety disorder [F41.1] 03/10/2015    Priority: High  . Bipolar disorder ([place]) [F31.9] 04/29/2015  . Insomnia [G47.00] 03/13/2015  . Dizziness and giddiness [R42] 02/20/2015  . GERD (gastroesophageal reflux disease) [K21.9] 02/20/2015  . Abnormal CXR [R93.8]   . Dyspnea [R06.00] 02/02/2015  . Hypoxia [R09.02] 02/02/2015  . Acute pulmonary edema ([place]) [J81.0] 02/02/2015  . Hypertensive urgency [I16.0] 02/02/2015  . Hypertensive crisis [I16.9]   . Nausea with vomiting [R11.2] 06/09/2011  . HTN (hypertension) [I10] 12/02/2010  . Diverticulitis of sigmoid colon [K57.32] 12/02/2010  . Tobacco dependence [F17.200] 12/02/2010   History of Present Illness:-------------patient seen today for medication follow-up, states that she has gained weight and is very worried about it. Once to discontinue the Risperdal. Discussed switching her to Abilify and discussed the rationale risks benefits options of Abilify and patient gave informed consent.  States she feels exhausted as been crying continues to be afraid to go to sleep for fear that she'll not wake up appetite is poor mood is irritable depressed she is tired with no energy and no motivation. Is beginning to feel hopeless. Denies any panic attacks but ruminates constantly. Denies suicidal or homicidal ideation and has no hallucinations or delusions.  Patient's neurovegetative symptoms have increased significantly and so I'm recommending that she start IOP. Patient is willing  to do that. She has been referred to IOP                                                                         Notes from initial visit on 03/10/15   Patient is a 50 year old African-American married female mother of 3 children age 56 and 50. Patient works as a Electrical engineer at [employer] Patient was hospitalized on 02/01/2015 at [facility] because of pulmonary edema and essential hypertension. She was discharged and has a follow-up appointment with Dr. Jarold Song. There has been some confusion about the medications she states that she ran out of some of her medications and did not get a refill.Patient is here today because she states she is afraid of going to sleep for fear of dying. States that she is depressed anxious worry that she'll die in her sleep like her parents did. Her parents did not die in the sleep but they were not ill and her mother died suddenly and followed a few months later by her father. Being hospitalized at [facility] has brought that fear and she was released constantly about it. Patient has become very irritable with the family members, unable to sleep appetite has increased she's been eating more lately. Depressed and irritated very anxious ruminates about dying has panic attacks. Patient is tearful states that she can't remember things tends to forget easily has  normal energy no motivation and is anhedonic. Also feels hopeless and helpless denies suicidal ideation now homicidal ideation no hallucinations or delusions.Today patient's blood pressure was 180/102, pulse was 79. Patient states she was 167 pounds when discharged from [facility] and today her weight is 179 pounds. Patient is concerned about. I tried reaching Dr. Farris Has unsuccessfullyPatient also stated that she was not taking her Lasix.   Substance Abuse History in the last 12 months:  Yes.   patient smoked a pack of cigarettes per day from the age of 12 and quit a month ago. She drinks  a glass of wine on weekends.   Consequences of Substance Abuse: Medical Consequences:  Pulmonary edema, essential hypertension   Past psychiatric history. Patient states her first episode of depression was as a Equities trader in high school. She did not receive any treatment. Her second episode of depression was in 07/31/1997 after her mother died suddenly. She went to [facility] from Jul 31, 1988 12/05/1998 and was treated with Zoloft and Wellbutrin and therapy.    Past Medical History: Recently hospitalized at [facility] on 02/01/2015 for essential hypertension and pulmonary edema. Past Medical History  Diagnosis Date  . Hypertension   . Osteoarthritis   . Depression   . Gallstones     Past Surgical History  Procedure Laterality Date  . Gallbladder surgery     Family History: Maternal grandmother had mental illness, mom had depression and alcohol problems. Family History  Problem Relation Age of Onset  . Coronary artery disease Paternal Grandmother     young age  . Hypertension Father   . Diabetes Sister   . Diabetes Paternal Grandmother    Social History:   she is presently married and lives with her husband and 50 year old daughter in [place]. Patient works as a Electrical engineer at [employer]  . Marital Status: Married    Spouse Name: N/A  . Number of Children: 3  . Years of Education: N/A   Occupational History  . Nurse [employer]   Social History Main Topics  . Smoking status: Former Smoker -- 0.50 packs/day for 1.5 years    Types: Cigarettes    Quit date: 01/03/2015  . Smokeless tobacco: Never Used  . Alcohol Use: Yes     Comment: Weekends.   . Drug Use: No  . Sexual Activity: Yes   Other Topics Concern  . None   Social History Narrative   Additional Social History: Patient was born and raised in [place]. States she did well in school but was a disruptive child and so had to sit in the hallway most of the time. She graduated  high school and joined Market researcher became pregnant. Patient has 3 children. They have different fathers  Musculoskeletal: Strength & Muscle Tone: within normal limits Gait & Station: normal Patient leans: N/A and Stand straight  Psychiatric Specialty Exam: HPI  Review of Systems  Psychiatric/Behavioral: Positive for depression. The patient is nervous/anxious and has insomnia.   All other systems reviewed and are negative.   Blood pressure 158/92, pulse 75, height 5\' 2"  (1.575 m), weight 191 lb 9.6 oz (86.909 kg), last menstrual period 04/13/2015.Body mass index is 35.04 kg/(m^2).  General Appearance: Casual and Fairly Groomed  Eye Contact:  Fair  Speech:  Clear and Coherent and Normal Rate  Volume:  Decreased  Mood:  Anxious, Depressed, Dysphoric and Hopeless  Affect:  Constricted, Depressed and Tearful  Thought Process:  Goal Directed, Linear and Logical  Orientation:  Full (Time, Place, and Person)  Thought Content:  Rumination  Suicidal Thoughts:  No  Homicidal Thoughts:  No  Memory:  Immediate;   Fair Recent;   Good Remote;   Good  Judgement:  Good  Insight:  Good  Psychomotor Activity:  Normal  Concentration:  Fair  Recall:  [place] of Knowledge:Good  Language: Good  Akathisia:  No  Handed:  Right  AIMS (if indicated):  0  Assets:  Communication Skills Desire for Improvement Financial Resources/Insurance Housing Resilience Social Support Transportation Vocational/Educational  ADL's:  Intact  Cognition: WNL  Sleep:  Poor    Is the patient at risk to self?  No. Has the patient been a risk to self in the past 6 months?  No. Has the patient been a risk to self within the distant past?  No. Is the patient a risk to others?  No. Has the patient been a risk to others in the past 6 months?  No. Has the patient been a risk to others within the distant past?  No.  Allergies:  As listed below Allergies  Allergen Reactions  . Azithromycin Other (See  Comments)    "Stomach cramps"  . Contrast Media [Iodinated Diagnostic Agents] Hives  . 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"   Current Medications: Current Outpatient Prescriptions  Medication Sig Dispense Refill  . ALPRAZolam (XANAX) 0.25 MG tablet Take 1 tablet (0.25 mg total) by mouth 3 (three) times daily. 90 tablet 0  . aspirin EC 81 MG EC tablet Take 1 tablet (81 mg total) by mouth daily.    . carvedilol (COREG) 25 MG tablet Take 1 tablet (25 mg total) by mouth 2 (two) times daily with a meal. Discontinue 6.25mg  60 tablet 2  . escitalopram (LEXAPRO) 20 MG tablet Take 1 tablet (20 mg total) by mouth daily. 30 tablet 2  . furosemide (LASIX) 20 MG tablet Take 1 tablet (20 mg total) by mouth daily. 90 tablet 0  . guaiFENesin (MUCINEX) 600 MG 12 hr tablet Take 600 mg by mouth 2 (two) times daily as needed (for cold symptoms).    . meclizine (ANTIVERT) 25 MG tablet Take 1 tablet (25 mg total) by mouth 3 (three) times daily as needed. 30 tablet 1  . pantoprazole (PROTONIX) 40 MG tablet Take 1 tablet (40 mg total) by mouth daily. 90 tablet 0  . potassium chloride SA (K-DUR,KLOR-CON) 20 MEQ tablet Take 1 tablet (20 mEq total) by mouth daily. 90 tablet 0  . risperiDONE (RISPERDAL) 0.25 MG tablet Take 1 tablet (0.25 mg total) by mouth 2 (two) times daily. 60 tablet 2  . [DISCONTINUED] promethazine (PHENERGAN) 25 MG suppository Place 1 suppository (25 mg total) rectally every 6 (six) hours as needed for nausea. 12 each 1   No current facility-administered medications for this visit.    Previous Psychotropic Medications: Yes     Medical Decision Making:  Review of Psycho-Social Stressors (1), Review or order clinical lab tests (1), Discuss test with performing physician (1), Established Problem, Worsening (2), New Problem, with no additional work-up planned (3), Review of Medication Regimen & Side Effects (2) and Review of New Medication or Change in Dosage  (2)  Treatment Plan Summary: Medication management #1 major depression recurrent severe Start Abilify 5 mg po q hs discussed the rationale risks benefits options and obtaining informed consent. DCRisperdal  Increase Lexapro 30 mg by mouth  every morning.  #2 generalized anxiety disorder. Treat with Abilify  Continue Xanax to 0.25 mg by mouth 3 times a day to help her anxiety.  #3 unresolved grief Regarding the death of her parents, patient was given reassurance that she will not die like her parents did. Patient has a very difficult time accepting reassurance.  #4 labs None at this visit.  #5 therapy None at this visit.  #6 patient  at this time will refer her to IOP. The IOP staff person Dellia Nims will call patient to schedule her. Patient can call sooner if necessary.   This was a 70minute visit of high density. More than 50% of the time was spent in counseling . Discussing medications and emphasizing med compliance. Interpersonal and supportive therapy was provided along with cognitive behavior therapy. For anxiety and depression Erin Sons 2/16/20173:12 PM

## 2015-05-26 ENCOUNTER — Encounter (HOSPITAL_COMMUNITY): Payer: Self-pay | Admitting: Psychiatry

## 2015-05-26 ENCOUNTER — Other Ambulatory Visit (HOSPITAL_COMMUNITY): Payer: 59 | Attending: Psychiatry | Admitting: Psychiatry

## 2015-05-26 DIAGNOSIS — F331 Major depressive disorder, recurrent, moderate: Secondary | ICD-10-CM | POA: Diagnosis not present

## 2015-05-26 DIAGNOSIS — G4700 Insomnia, unspecified: Secondary | ICD-10-CM | POA: Diagnosis not present

## 2015-05-26 DIAGNOSIS — Z87891 Personal history of nicotine dependence: Secondary | ICD-10-CM | POA: Diagnosis not present

## 2015-05-26 DIAGNOSIS — Z7982 Long term (current) use of aspirin: Secondary | ICD-10-CM | POA: Diagnosis not present

## 2015-05-26 DIAGNOSIS — I10 Essential (primary) hypertension: Secondary | ICD-10-CM | POA: Diagnosis not present

## 2015-05-26 DIAGNOSIS — F411 Generalized anxiety disorder: Secondary | ICD-10-CM | POA: Insufficient documentation
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
## 2015-05-26 NOTE — Progress Notes (Signed)
Psychiatric Initial Adult Assessment   Patient Identification: Brandy [name] [name] MRN:  [id number] Date of Evaluation:  05/26/2015 Referral Source: self Chief Complaint:   Chief Complaint    Depression; Anxiety; Stress     Visit Diagnosis:    ICD-9-CM ICD-10-CM   1. Generalized anxiety disorder 300.02 F41.1   2. Major depressive disorder, recurrent episode, moderate (HCC) 296.32 F33.1    Diagnosis:   Patient Active Problem List   Diagnosis Date Noted  . Bipolar disorder (Brandy [name] [name]) [F31.9] 04/29/2015  . Insomnia [G47.00] 03/13/2015  . Major depression, recurrent (Brandy [name] [name]) [F33.9] 03/10/2015  . Generalized anxiety disorder [F41.1] 03/10/2015  . Dizziness and giddiness [R42] 02/20/2015  . GERD (gastroesophageal reflux disease) [K21.9] 02/20/2015  . Abnormal CXR [R93.8]   . Dyspnea [R06.00] 02/02/2015  . Hypoxia [R09.02] 02/02/2015  . Acute pulmonary edema ([place]) [J81.0] 02/02/2015  . Hypertensive urgency [I16.0] 02/02/2015  . Hypertensive crisis [I16.9]   . Nausea with vomiting [R11.2] 06/09/2011  . HTN (hypertension) [I10] 12/02/2010  . Diverticulitis of sigmoid colon [K57.32] 12/02/2010  . Tobacco dependence [F17.200] 12/02/2010   History of Present Illness:  Ms Brandy [name] [name] suffers from depression and anxiety.  She had an episode of lung and heart congestion in October that put her into ICU.  Since that time she has been overly afraid of dying in her sleep.  Both her parents died at a young age, her father at 20 and her mother at 67.  Both were apparently healthy and died suddenly.  She was very close to her father and not so much to her mother.  As a child she did not feel loved particularly but was well provided for.  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  No abuse.  Has 2 grown sons and a 36 year old daughter.  Married to a good man for the last 3  years.  Since the experience in the hospital her anxiety of dying has been high and depression has increased to the point of crying spells, increased anger and irritability even towards good people and things, increased guilt, poor sleep, gaining weight, panic attacks,sadness and feeling she cannot go on this way.  Normally likes her work but it is all she can do to get through a shift.  Avoiding friends, family and church.  Just wants to be left alone or to just drive away with nobody knowing where she is. Elements:  Location:  depression and anxiety. Quality:  irritability interfering with all aspects of her life. Severity:  cannot keep going this way she says. Timing:  ICU for heart and lung congestion. Duration:  4 months. Context:  as above. Associated Signs/Symptoms: Depression Symptoms:  depressed mood, anhedonia, insomnia, hypersomnia, fatigue, feelings of worthlessness/guilt, difficulty concentrating, hopelessness, impaired memory, anxiety, panic attacks, loss of energy/fatigue, disturbed sleep, weight gain, (Hypo) Manic Symptoms:  Irritable Mood, Anxiety Symptoms:  Excessive Worry, Psychotic Symptoms:  none PTSD Symptoms: Negative  Past Medical History:  Past Medical History  Diagnosis Date  . Hypertension   . Osteoarthritis   . Depression   . Gallstones   . Anxiety     Past Surgical History  Procedure Laterality Date  . Gallbladder surgery     Family History:  Family History  Problem Relation Age of Onset  . Coronary artery disease Paternal Grandmother     young  age  . Diabetes Paternal Grandmother   . Hypertension Father   . Diabetes Sister   . Alcohol abuse Mother   . Schizophrenia Maternal Grandmother    Social History:   Social History   Social History  . Marital Status: Married    Spouse Name: N/A  . Number of Children: 3  . Years of Education: N/A   Occupational History  . Nurse Brandy [name] [name]   Social History Main Topics  . Smoking  status: Former Smoker -- 0.50 packs/day for 1.5 years    Types: Cigarettes    Quit date: 01/03/2015  . Smokeless tobacco: Never Used  . Alcohol Use: Yes     Comment: Weekends.   . Drug Use: Yes    Special: Marijuana     Comment: THC  . Sexual Activity: Yes   Other Topics Concern  . None   Social History Narrative   Additional Social History: none  Musculoskeletal: Strength & Muscle Tone: within normal limits Gait & Station: normal Patient leans: N/A  Psychiatric Specialty Exam: HPI  ROS  Last menstrual period 04/13/2015.There is no weight on file to calculate BMI.  General Appearance: Well Groomed  Eye Contact:  Good  Speech:  Clear and Coherent  Volume:  Normal  Mood:  Depressed  Affect:  Congruent  Thought Process:  Coherent and Logical  Orientation:  Full (Time, Place, and Person)  Thought Content:  Negative  Suicidal Thoughts:  No  Homicidal Thoughts:  No  Memory:  Immediate;   Good Recent;   Good Remote;   Good  Judgement:  Good  Insight:  Good  Psychomotor Activity:  Normal  Concentration:  Good  Recall:  Good  Fund of Knowledge:Good  Language: Good  Akathisia:  Negative  Handed:  Right  AIMS (if indicated):  0  Assets:  Communication Skills Desire for Improvement Financial Resources/Insurance Housing Intimacy Resilience Social Support Talents/Skills Transportation Vocational/Educational  ADL's:  Intact  Cognition: WNL  Sleep:  poor   Is the patient at risk to self?  No. Has the patient been a risk to self in the past 6 months?  No. Has the patient been a risk to self within the distant past?  Yes.   Is the patient a risk to others?  No. Has the patient been a risk to others in the past 6 months?  No. Has the patient been a risk to others within the distant past?  No.  Allergies:   Allergies  Allergen Reactions  . Azithromycin Other (See Comments)    "Stomach cramps"  . Contrast Media [Iodinated Diagnostic Agents] Hives  . Shellfish  Allergy Swelling and Other (See Comments)    "eyes went blind"   Current Medications: Current Outpatient Prescriptions  Medication Sig Dispense Refill  . ALPRAZolam (XANAX) 0.25 MG tablet Take 1 tablet (0.25 mg total) by mouth 3 (three) times daily. 90 tablet 0  . ARIPiprazole (ABILIFY) 5 MG tablet Take 1 tablet (5 mg total) by mouth at bedtime. 30 tablet 2  . aspirin EC 81 MG EC tablet Take 1 tablet (81 mg total) by mouth daily.    . carvedilol (COREG) 25 MG tablet Take 1 tablet (25 mg total) by mouth 2 (two) times daily with a meal. Discontinue 6.25mg  60 tablet 2  . escitalopram (LEXAPRO) 20 MG tablet Take 1.5 tablets (30 mg total) by mouth daily. 45 tablet 2  . furosemide (LASIX) 20 MG tablet Take 1 tablet (20 mg total) by mouth daily.  90 tablet 0  . guaiFENesin (MUCINEX) 600 MG 12 hr tablet Take 600 mg by mouth 2 (two) times daily as needed (for cold symptoms).    . meclizine (ANTIVERT) 25 MG tablet TAKE 1 TABLET BY MOUTH 3 TIMES DAILY AS NEEDED. 30 tablet 2  . pantoprazole (PROTONIX) 40 MG tablet Take 1 tablet (40 mg total) by mouth daily. 90 tablet 0  . potassium chloride SA (K-DUR,KLOR-CON) 20 MEQ tablet Take 1 tablet (20 mEq total) by mouth daily. 90 tablet 0  . [DISCONTINUED] promethazine (PHENERGAN) 25 MG suppository Place 1 suppository (25 mg total) rectally every 6 (six) hours as needed for nausea. 12 each 1   No current facility-administered medications for this visit.    Previous Psychotropic Medications: Yes   Substance Abuse History in the last 12 months:  No.  Consequences of Substance Abuse: Negative  Medical Decision Making:  Established Problem, Worsening (2)  Treatment Plan Summary: daily group    Donnelly Angelica 2/21/20172:15 PM

## 2015-05-27 ENCOUNTER — Other Ambulatory Visit (HOSPITAL_COMMUNITY): Payer: 59 | Admitting: Psychiatry

## 2015-05-27 DIAGNOSIS — I10 Essential (primary) hypertension: Secondary | ICD-10-CM | POA: Diagnosis not present

## 2015-05-27 DIAGNOSIS — Z7982 Long term (current) use of aspirin: Secondary | ICD-10-CM | POA: Diagnosis not present

## 2015-05-27 DIAGNOSIS — Z87891 Personal history of nicotine dependence: Secondary | ICD-10-CM | POA: Diagnosis not present

## 2015-05-27 DIAGNOSIS — F411 Generalized anxiety disorder: Secondary | ICD-10-CM

## 2015-05-27 DIAGNOSIS — F331 Major depressive disorder, recurrent, moderate: Secondary | ICD-10-CM | POA: Diagnosis not present

## 2015-05-27 DIAGNOSIS — G4700 Insomnia, unspecified: Secondary | ICD-10-CM | POA: Diagnosis not present

## 2015-05-27 NOTE — Progress Notes (Signed)
    Daily Group Progress Note  Program: IOP  Group Time: 9:00-10:30  Participation Level: Active  Behavioral Response: Appropriate  Type of Therapy:  Group Therapy  Summary of Progress: Pt. Met with case manager and psychiatrist for intake assessment.     Group Time: 10:30-12:00  Participation Level:  Active  Behavioral Response: Appropriate  Type of Therapy: Psycho-education Group  Summary of Progress: Pt. Participated in discussion about patterns of perfectionism, disconnection from self and lack of self-care. Pt. Shared that she continues to grieve loss of her parents about twenty years ago and that grieving is loss near the anniversaries of their deaths in April.  Nancie Neas, LPC

## 2015-05-28 ENCOUNTER — Other Ambulatory Visit (HOSPITAL_COMMUNITY): Payer: 59 | Admitting: Psychiatry

## 2015-05-28 DIAGNOSIS — Z7982 Long term (current) use of aspirin: Secondary | ICD-10-CM | POA: Diagnosis not present

## 2015-05-28 DIAGNOSIS — Z87891 Personal history of nicotine dependence: Secondary | ICD-10-CM | POA: Diagnosis not present

## 2015-05-28 DIAGNOSIS — F411 Generalized anxiety disorder: Secondary | ICD-10-CM

## 2015-05-28 DIAGNOSIS — F331 Major depressive disorder, recurrent, moderate: Secondary | ICD-10-CM | POA: Diagnosis not present

## 2015-05-28 DIAGNOSIS — I10 Essential (primary) hypertension: Secondary | ICD-10-CM | POA: Diagnosis not present

## 2015-05-28 DIAGNOSIS — G4700 Insomnia, unspecified: Secondary | ICD-10-CM | POA: Diagnosis not present

## 2015-05-28 NOTE — Progress Notes (Signed)
Patient ID: Brandy [name], female   DOB: 09-30-1965, 50 y.o.   MRN: [id number] D:  Pt c/o continued A/V hallucinations.  A:  Dr. Lovena Le increased Abilify to 10 mg at hs.  R:  Pt receptive.

## 2015-05-28 NOTE — Progress Notes (Signed)
    Daily Group Progress Note  Program: IOP  Group Time: 9:00-12:00  Participation Level: Active  Behavioral Response: Appropriate  Type of Therapy:  Group Therapy  Summary of Progress: Pt. Presented as talkative, energetic, good sense of humor, makes positive connections with group members and is receptive to feedback. Pt. Participated in discussion about how her diet affects her mood. Pt. Participated in discussion about personality traits of "highly sensitive people". Pt participated in discussion about reflective reading about not hesitating, but learning to use our breath to help [name] move through experiences.       Nancie Neas, LPC

## 2015-05-28 NOTE — Progress Notes (Signed)
    Daily Group Progress Note  Program: IOP  Group Time: 9:00-12:00  Participation Level: Active  Behavioral Response: Appropriate  Type of Therapy:  Group Therapy  Summary of Progress: Pt. Presented as calm, talkative,and engaged in the group process. Pt. Discussed patterns of perfectionism, high expectations from her family members and feeling that her family did not appreciate or reward her hard work. Pt. Identified patterns of difficulty receiving assistance from others and feelings of unworthiness especially as it relates to her relationship with her father. Pt. Reported gastrointestinal distress and history of diverticulitis that complicates her depression and tendency to isolate.     Nancie Neas, LPC

## 2015-05-29 ENCOUNTER — Encounter: Payer: Self-pay | Admitting: Clinical

## 2015-05-29 ENCOUNTER — Other Ambulatory Visit (HOSPITAL_COMMUNITY): Payer: 59 | Admitting: Psychiatry

## 2015-05-29 DIAGNOSIS — F331 Major depressive disorder, recurrent, moderate: Secondary | ICD-10-CM | POA: Diagnosis not present

## 2015-05-29 DIAGNOSIS — F411 Generalized anxiety disorder: Secondary | ICD-10-CM

## 2015-05-29 DIAGNOSIS — Z7982 Long term (current) use of aspirin: Secondary | ICD-10-CM | POA: Diagnosis not present

## 2015-05-29 DIAGNOSIS — Z87891 Personal history of nicotine dependence: Secondary | ICD-10-CM | POA: Diagnosis not present

## 2015-05-29 DIAGNOSIS — G4700 Insomnia, unspecified: Secondary | ICD-10-CM | POA: Diagnosis not present

## 2015-05-29 DIAGNOSIS — I10 Essential (primary) hypertension: Secondary | ICD-10-CM | POA: Diagnosis not present

## 2015-05-29 NOTE — Progress Notes (Signed)
Depression screen [facility] 2/9 04/29/2015 03/13/2015 03/13/2015 02/20/2015 02/13/2015  Decreased Interest 3 3 0 0 3  Down, Depressed, Hopeless 2 3 0 0 3  PHQ - 2 Score 5 6 0 0 6  Altered sleeping 3 - - - 3  Tired, decreased energy 3 - - - 3  Change in appetite 3 - - - 2  Feeling bad or failure about yourself  3 - - - 3  Trouble concentrating 3 - - - 2  Moving slowly or fidgety/restless 2 - - - 2  Suicidal thoughts 1 - - - 0  PHQ-9 Score 23 - - - 21  Some encounter information is confidential and restricted. Go to Review Flowsheets activity to see all data.    GAD 7 : Generalized Anxiety Score 04/29/2015 02/13/2015  Nervous, Anxious, on Edge 3 3  Control/stop worrying 3 3  Worry too much - different things 3 3  Trouble relaxing 2 2  Restless 3 2  Easily annoyed or irritable 3 3  Afraid - awful might happen 3 3  Total GAD 7 Score 20 19    * Seeing Behavioral Health, per Dr Jarold Song

## 2015-05-29 NOTE — Progress Notes (Signed)
    Daily Group Progress Note  Program: IOP  Group Time:  9:00-10:30  Participation Level: Active  Behavioral Response: Appropriate  Type of Therapy:  Group Therapy  Summary of Progress: Pt. Presented as talkative, engaged in group process. Pt. Shared pain about losing her mother and father. Pt. Discussed her mother's alcohol abuse and her mother's inability to be emotionally present because of her addiction.     Group Time: 10:30-12:00  Participation Level:  Active  Behavioral Response: Appropriate  Type of Therapy: Psycho-education Group  Summary of Progress: Pt. Participated in discussion about grief and loss.  Nancie Neas, LPC

## 2015-06-01 ENCOUNTER — Other Ambulatory Visit (HOSPITAL_COMMUNITY): Payer: 59 | Admitting: Psychiatry

## 2015-06-01 ENCOUNTER — Telehealth (HOSPITAL_COMMUNITY): Payer: Self-pay | Admitting: Psychiatry

## 2015-06-02 ENCOUNTER — Other Ambulatory Visit (HOSPITAL_COMMUNITY): Payer: 59 | Admitting: Psychiatry

## 2015-06-02 DIAGNOSIS — F411 Generalized anxiety disorder: Secondary | ICD-10-CM | POA: Diagnosis not present

## 2015-06-02 DIAGNOSIS — F331 Major depressive disorder, recurrent, moderate: Secondary | ICD-10-CM | POA: Diagnosis not present

## 2015-06-02 DIAGNOSIS — Z87891 Personal history of nicotine dependence: Secondary | ICD-10-CM | POA: Diagnosis not present

## 2015-06-02 DIAGNOSIS — Z7982 Long term (current) use of aspirin: Secondary | ICD-10-CM | POA: Diagnosis not present

## 2015-06-02 DIAGNOSIS — G4700 Insomnia, unspecified: Secondary | ICD-10-CM | POA: Diagnosis not present

## 2015-06-02 DIAGNOSIS — I10 Essential (primary) hypertension: Secondary | ICD-10-CM | POA: Diagnosis not present

## 2015-06-02 NOTE — Progress Notes (Signed)
    Daily Group Progress Note  Program: IOP  Group Time: 9:00-12:00  Participation Level: Active  Behavioral Response: Appropriate  Type of Therapy:  Group Therapy  Summary of Progress: Pt. Presents as talkative, engaged in the group process. Pt. Recognizes her sense of humor as a considerable strength. Pt. Has strong relationships with her husband, daughter, and son's, but is challenged to create healthy boundaries with them and stop pattern of over-performing in her relationships with them. Pt. Was encouraged to develop a budget for her time and internal resources and only give to her family what she has "budgeted" so that she does not end up feeling overwhelmed and resentful. Pt. Was open and receptive to feedback from the group.     Nancie Neas, LPC

## 2015-06-03 ENCOUNTER — Other Ambulatory Visit (HOSPITAL_COMMUNITY): Payer: 59 | Attending: Psychiatry | Admitting: Psychiatry

## 2015-06-03 DIAGNOSIS — F410 Panic disorder [episodic paroxysmal anxiety] without agoraphobia: Secondary | ICD-10-CM | POA: Insufficient documentation

## 2015-06-03 DIAGNOSIS — I10 Essential (primary) hypertension: Secondary | ICD-10-CM | POA: Diagnosis not present

## 2015-06-03 DIAGNOSIS — F411 Generalized anxiety disorder: Secondary | ICD-10-CM | POA: Insufficient documentation

## 2015-06-03 DIAGNOSIS — Z87891 Personal history of nicotine dependence: Secondary | ICD-10-CM | POA: Insufficient documentation

## 2015-06-03 DIAGNOSIS — F331 Major depressive disorder, recurrent, moderate: Secondary | ICD-10-CM | POA: Insufficient documentation

## 2015-06-04 ENCOUNTER — Other Ambulatory Visit (HOSPITAL_COMMUNITY): Payer: 59 | Admitting: Psychiatry

## 2015-06-04 DIAGNOSIS — F331 Major depressive disorder, recurrent, moderate: Secondary | ICD-10-CM | POA: Diagnosis not present

## 2015-06-04 DIAGNOSIS — F410 Panic disorder [episodic paroxysmal anxiety] without agoraphobia: Secondary | ICD-10-CM | POA: Diagnosis not present

## 2015-06-04 DIAGNOSIS — Z87891 Personal history of nicotine dependence: Secondary | ICD-10-CM | POA: Diagnosis not present

## 2015-06-04 DIAGNOSIS — F411 Generalized anxiety disorder: Secondary | ICD-10-CM

## 2015-06-04 DIAGNOSIS — I10 Essential (primary) hypertension: Secondary | ICD-10-CM | POA: Diagnosis not present

## 2015-06-05 ENCOUNTER — Other Ambulatory Visit (HOSPITAL_COMMUNITY): Payer: 59 | Admitting: Psychiatry

## 2015-06-05 DIAGNOSIS — Z87891 Personal history of nicotine dependence: Secondary | ICD-10-CM | POA: Diagnosis not present

## 2015-06-05 DIAGNOSIS — F410 Panic disorder [episodic paroxysmal anxiety] without agoraphobia: Secondary | ICD-10-CM | POA: Diagnosis not present

## 2015-06-05 DIAGNOSIS — F411 Generalized anxiety disorder: Secondary | ICD-10-CM | POA: Diagnosis not present

## 2015-06-05 DIAGNOSIS — I10 Essential (primary) hypertension: Secondary | ICD-10-CM | POA: Diagnosis not present

## 2015-06-05 DIAGNOSIS — F331 Major depressive disorder, recurrent, moderate: Secondary | ICD-10-CM | POA: Diagnosis not present

## 2015-06-05 NOTE — Progress Notes (Signed)
    Daily Group Progress Note  Program: IOP  Daily Group Progress Note  Program: IOP  Group Time: 9:00-12:00  Participation Level: Active  Behavioral Response: Appropriate  Type of Therapy: Group Therapy  Summary of Progress: Pt. Continues to present as talkative, bright affect. Pt. Processes creating healthy boundaries with her husband and her daughter and recognizing when she is overwhelmed by home and work responsibilities. Pt. Also processing relationship with her sister who frequently says hurtful things to her and need for healthy boundaries in the relationship.  Nancie Neas, LPC
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
## 2015-06-08 ENCOUNTER — Other Ambulatory Visit (HOSPITAL_COMMUNITY): Payer: 59 | Admitting: Psychiatry

## 2015-06-08 DIAGNOSIS — F411 Generalized anxiety disorder: Secondary | ICD-10-CM

## 2015-06-08 DIAGNOSIS — I10 Essential (primary) hypertension: Secondary | ICD-10-CM | POA: Diagnosis not present

## 2015-06-08 DIAGNOSIS — F410 Panic disorder [episodic paroxysmal anxiety] without agoraphobia: Secondary | ICD-10-CM | POA: Diagnosis not present

## 2015-06-08 DIAGNOSIS — Z87891 Personal history of nicotine dependence: Secondary | ICD-10-CM | POA: Diagnosis not present

## 2015-06-08 DIAGNOSIS — F331 Major depressive disorder, recurrent, moderate: Secondary | ICD-10-CM | POA: Diagnosis not present

## 2015-06-08 NOTE — Progress Notes (Signed)
    Daily Group Progress Note  Program: IOP  Group Time: 9:00-10:30  Participation Level: Active  Behavioral Response: Appropriate  Type of Therapy:  Psycho-education Group  Summary of Progress: Pt. Participated in medication management group with Jiles Garter.     Group Time: 10:30-12:00  Participation Level:  Active  Behavioral Response: Appropriate  Type of Therapy: Group Therapy  Summary of Progress: Pt. Presented as talkative and engaged in the group process. Pt. Shared that her family is grieving due to unexpected death of her daughter's father. Pt. Shared anxiety related to her daughter's grief and wanting to console her. Pt. Also discussed that she does not feel ready to return to work. Pt. Participated in discussion about improving nutrition, sleep hygiene, sun expose and exercise to help with emotion regulation.  Nancie Neas, LPC

## 2015-06-08 NOTE — Progress Notes (Signed)
    Daily Group Progress Note  Program: IOP  Group Time: 9:00-10:30  Participation Level: Active  Behavioral Response: Appropriate  Type of Therapy:  Group Therapy  Summary of Progress: Pt. Presents as talkative, engaged in group process, receives feedback and provides thoughtful feedback to others. Pt. Participated in grounding meditation for anxiety and panic attack.     Group Time: 10:30-12:00  Participation Level:  Active  Behavioral Response: Appropriate  Type of Therapy: Psycho-education Group  Summary of Progress: Pt. Participated in grief and loss facilitated by Jeanella Craze.  Nancie Neas, LPC

## 2015-06-09 ENCOUNTER — Other Ambulatory Visit (HOSPITAL_COMMUNITY): Payer: 59 | Admitting: Psychiatry

## 2015-06-09 DIAGNOSIS — F331 Major depressive disorder, recurrent, moderate: Secondary | ICD-10-CM | POA: Diagnosis not present

## 2015-06-09 DIAGNOSIS — I10 Essential (primary) hypertension: Secondary | ICD-10-CM | POA: Diagnosis not present

## 2015-06-09 DIAGNOSIS — Z87891 Personal history of nicotine dependence: Secondary | ICD-10-CM | POA: Diagnosis not present

## 2015-06-09 DIAGNOSIS — F410 Panic disorder [episodic paroxysmal anxiety] without agoraphobia: Secondary | ICD-10-CM | POA: Diagnosis not present

## 2015-06-09 DIAGNOSIS — F411 Generalized anxiety disorder: Secondary | ICD-10-CM | POA: Diagnosis not present

## 2015-06-09 MED ORDER — QUETIAPINE FUMARATE 50 MG PO TABS: 50.0000 mg | ORAL_TABLET | Freq: Every day | ORAL | Status: DC

## 2015-06-10 ENCOUNTER — Other Ambulatory Visit (HOSPITAL_COMMUNITY): Payer: 59 | Admitting: Psychiatry

## 2015-06-10 DIAGNOSIS — Z87891 Personal history of nicotine dependence: Secondary | ICD-10-CM | POA: Diagnosis not present

## 2015-06-10 DIAGNOSIS — F411 Generalized anxiety disorder: Secondary | ICD-10-CM | POA: Diagnosis not present

## 2015-06-10 DIAGNOSIS — I10 Essential (primary) hypertension: Secondary | ICD-10-CM | POA: Diagnosis not present

## 2015-06-10 DIAGNOSIS — F410 Panic disorder [episodic paroxysmal anxiety] without agoraphobia: Secondary | ICD-10-CM | POA: Diagnosis not present

## 2015-06-10 DIAGNOSIS — F331 Major depressive disorder, recurrent, moderate: Secondary | ICD-10-CM | POA: Diagnosis not present

## 2015-06-11 ENCOUNTER — Other Ambulatory Visit (HOSPITAL_COMMUNITY): Payer: 59 | Admitting: Psychiatry

## 2015-06-11 DIAGNOSIS — F411 Generalized anxiety disorder: Secondary | ICD-10-CM

## 2015-06-11 DIAGNOSIS — Z87891 Personal history of nicotine dependence: Secondary | ICD-10-CM | POA: Diagnosis not present

## 2015-06-11 DIAGNOSIS — F410 Panic disorder [episodic paroxysmal anxiety] without agoraphobia: Secondary | ICD-10-CM | POA: Diagnosis not present

## 2015-06-11 DIAGNOSIS — I10 Essential (primary) hypertension: Secondary | ICD-10-CM | POA: Diagnosis not present

## 2015-06-11 DIAGNOSIS — F331 Major depressive disorder, recurrent, moderate: Secondary | ICD-10-CM | POA: Diagnosis not present

## 2015-06-11 NOTE — Progress Notes (Signed)
    Daily Group Progress Note  Program: IOP  Group Time: 9:00-10:30  Participation Level: Active  Behavioral Response: Appropriate  Type of Therapy:  Group Therapy  Summary of Progress: Pt. Presents as talkative and engaged int he group process. Pt. Continues to process loss of her daughter's father, return to work, and reports that she is making progress with setting healthy boundaries in her family relationship.     Group Time: 10:30-12:00  Participation Level:  Active  Behavioral Response: Appropriate  Type of Therapy: Psycho-education Group  Summary of Progress: Pt. Participated in yoga session facilitated by Jan Fireman, LPC.  Eloise Levels, Ph.D., [facility]

## 2015-06-11 NOTE — Progress Notes (Signed)
    Daily Group Progress Note  Program: IOP  Group Time: 9:00-12:00  Participation Level: Active  Behavioral Response: Appropriate  Type of Therapy:  Group Therapy  Summary of Progress: Pt. Presents as talkative, engaged in the group process, laughs appropriately. Pt. Presents as significantly less depressed compared to first week of group. Pt. Has been successful with communicating need for more rigid time boundaries with family members, grieving loss of her parents close to her birthday and managing job related stress. Pt. Reports that addition of seroquel has been helpful and that she was able to sleep well last night as a result.     Nancie Neas, LPC

## 2015-06-12 ENCOUNTER — Other Ambulatory Visit (HOSPITAL_COMMUNITY): Payer: 59

## 2015-06-12 NOTE — Progress Notes (Signed)
    Daily Group Progress Note  Program: IOP  Group Time: 9:00-10:30  Participation Level: Active  Behavioral Response: Appropriate  Type of Therapy:  Psycho-education Group  Summary of Progress: Pt. Participated in discharge planning group with the case manager.      Group Time: 10:30-12:00  Participation Level:  Active  Behavioral Response: Appropriate  Type of Therapy: Group Therapy  Summary of Progress: Pt. Continues to present as talkative, laughs appropriately, engaged in the group process. Pt reports that she is feeling much better, less tearfulness, and has been able to enforce healthier boundaries with her family members. Pt. Concerned about her daughter's grieving process and planning to attend her daughter's father's funeral over the weekend.  Nancie Neas, LPC

## 2015-06-15 ENCOUNTER — Ambulatory Visit (HOSPITAL_COMMUNITY): Payer: Self-pay | Admitting: Psychiatry

## 2015-06-15 ENCOUNTER — Other Ambulatory Visit (HOSPITAL_COMMUNITY): Payer: 59 | Admitting: Psychiatry

## 2015-06-15 DIAGNOSIS — Z87891 Personal history of nicotine dependence: Secondary | ICD-10-CM | POA: Diagnosis not present

## 2015-06-15 DIAGNOSIS — F411 Generalized anxiety disorder: Secondary | ICD-10-CM

## 2015-06-15 DIAGNOSIS — F331 Major depressive disorder, recurrent, moderate: Secondary | ICD-10-CM | POA: Diagnosis not present

## 2015-06-15 DIAGNOSIS — I10 Essential (primary) hypertension: Secondary | ICD-10-CM | POA: Diagnosis not present

## 2015-06-15 DIAGNOSIS — F410 Panic disorder [episodic paroxysmal anxiety] without agoraphobia: Secondary | ICD-10-CM | POA: Diagnosis not present

## 2015-06-15 NOTE — Progress Notes (Signed)
    Daily Group Progress Note  Program: IOP  Group Time: 9:00-10:30  Participation Level: Active  Behavioral Response: Appropriate  Type of Therapy:  Psycho-education Group  Summary of Progress: Pt. Participated in medication management education group with [place].     Group Time: 10:30-12:00  Participation Level:  Active  Behavioral Response: Appropriate  Type of Therapy: Group Therapy  Summary of Progress: Pt. Presented as talkative, and engaged in the group process. Pt. Shared that she had some anxiety over the weekend, but with awareness that it was triggered by preparing for a funeral. Participated in discussion about stigma and shame associated with mental health diagnosis.  Nancie Neas, LPC

## 2015-06-16 ENCOUNTER — Other Ambulatory Visit (HOSPITAL_COMMUNITY): Payer: 59 | Admitting: Psychiatry

## 2015-06-16 DIAGNOSIS — I10 Essential (primary) hypertension: Secondary | ICD-10-CM | POA: Diagnosis not present

## 2015-06-16 DIAGNOSIS — F331 Major depressive disorder, recurrent, moderate: Secondary | ICD-10-CM | POA: Diagnosis not present

## 2015-06-16 DIAGNOSIS — F411 Generalized anxiety disorder: Secondary | ICD-10-CM

## 2015-06-16 DIAGNOSIS — F410 Panic disorder [episodic paroxysmal anxiety] without agoraphobia: Secondary | ICD-10-CM | POA: Diagnosis not present

## 2015-06-16 DIAGNOSIS — Z87891 Personal history of nicotine dependence: Secondary | ICD-10-CM | POA: Diagnosis not present

## 2015-06-16 NOTE — Progress Notes (Signed)
    Daily Group Progress Note  Program: IOP  Group Time:  9:00-12:00  Participation Level: Active  Behavioral Response: Appropriate  Type of Therapy:  Group Therapy  Summary of Progress: Pt. Presents as active, engaged in group process, talks and laughs appropriately. Pt. Shared that she continues to be challenged by creating healthy boundaries especially with her daughter, but is making progress in consistently enforcing boundaries. Pt. Participated in discussion about [facility] therapy and understanding how childhood wounds affect our current relationships.     Nancie Neas, LPC

## 2015-06-17 ENCOUNTER — Ambulatory Visit: Payer: Self-pay | Admitting: Family Medicine

## 2015-06-17 ENCOUNTER — Other Ambulatory Visit (HOSPITAL_COMMUNITY): Payer: 59 | Admitting: Psychiatry

## 2015-06-17 DIAGNOSIS — F411 Generalized anxiety disorder: Secondary | ICD-10-CM | POA: Diagnosis not present

## 2015-06-17 DIAGNOSIS — F410 Panic disorder [episodic paroxysmal anxiety] without agoraphobia: Secondary | ICD-10-CM | POA: Diagnosis not present

## 2015-06-17 DIAGNOSIS — F331 Major depressive disorder, recurrent, moderate: Secondary | ICD-10-CM | POA: Diagnosis not present

## 2015-06-17 DIAGNOSIS — Z87891 Personal history of nicotine dependence: Secondary | ICD-10-CM | POA: Diagnosis not present

## 2015-06-17 DIAGNOSIS — I10 Essential (primary) hypertension: Secondary | ICD-10-CM | POA: Diagnosis not present

## 2015-06-17 NOTE — Progress Notes (Signed)
    Daily Group Progress Note  Program: IOP  Group Time: 9:00-10:30  Participation Level: Active  Behavioral Response: Appropriate  Type of Therapy:  Group Therapy  Summary of Progress: Pt. Presents with bright affect, talkative, engaged in group process. Pt. Discussed planned return to work next week. Pt. Discussed tendency to separate work from her personal life in order to better manage stress.     Group Time: 10:30-12:00  Participation Level:  Active  Behavioral Response: Appropriate  Type of Therapy: Psycho-education Group  Summary of Progress: Pt. Participated in yoga therapy session facilitated by Leanne yates, LPC. Nancie Neas, LPC

## 2015-06-18 ENCOUNTER — Other Ambulatory Visit (HOSPITAL_COMMUNITY): Payer: 59 | Admitting: Psychiatry

## 2015-06-18 DIAGNOSIS — F411 Generalized anxiety disorder: Secondary | ICD-10-CM

## 2015-06-18 DIAGNOSIS — F331 Major depressive disorder, recurrent, moderate: Secondary | ICD-10-CM | POA: Diagnosis not present

## 2015-06-18 DIAGNOSIS — I10 Essential (primary) hypertension: Secondary | ICD-10-CM | POA: Diagnosis not present

## 2015-06-18 DIAGNOSIS — Z87891 Personal history of nicotine dependence: Secondary | ICD-10-CM | POA: Diagnosis not present

## 2015-06-18 DIAGNOSIS — F410 Panic disorder [episodic paroxysmal anxiety] without agoraphobia: Secondary | ICD-10-CM | POA: Diagnosis not present

## 2015-06-18 NOTE — Progress Notes (Addendum)
Patient ID: Brandy [name], female   DOB: [birth year], 50 y.o.   MRN: [id number] Discharge Note  Patient:  Brandy [name] is an 50 y.o., female DOB:  [birth year]  Date of Admission:  05/26/2015  Date of Discharge:  06/19/2015  Reason for Admission:depression and anxiety  IOP Course:  Attended and participated.  Feels less anxious and depressed.  Says it was very helpful to be around others with similar problems and to feel supported and understood  Mental Status at Discharge:no suicidal thoughts.  Anxiety and depression much less  Lab Results: No results found for this or any previous visit (from the past 48 hour(s)).   Current outpatient prescriptions:  .  ALPRAZolam (XANAX) 0.25 MG tablet, Take 1 tablet (0.25 mg total) by mouth 3 (three) times daily., Disp: 90 tablet, Rfl: 0 .  aspirin EC 81 MG EC tablet, Take 1 tablet (81 mg total) by mouth daily., Disp: , Rfl:  .  carvedilol (COREG) 25 MG tablet, Take 1 tablet (25 mg total) by mouth 2 (two) times daily with a meal. Discontinue 6.25mg , Disp: 60 tablet, Rfl: 2 .  escitalopram (LEXAPRO) 20 MG tablet, Take 1.5 tablets (30 mg total) by mouth daily., Disp: 45 tablet, Rfl: 2 .  furosemide (LASIX) 20 MG tablet, Take 1 tablet (20 mg total) by mouth daily., Disp: 90 tablet, Rfl: 0 .  guaiFENesin (MUCINEX) 600 MG 12 hr tablet, Take 600 mg by mouth 2 (two) times daily as needed (for cold symptoms)., Disp: , Rfl:  .  meclizine (ANTIVERT) 25 MG tablet, TAKE 1 TABLET BY MOUTH 3 TIMES DAILY AS NEEDED., Disp: 30 tablet, Rfl: 2 .  pantoprazole (PROTONIX) 40 MG tablet, Take 1 tablet (40 mg total) by mouth daily., Disp: 90 tablet, Rfl: 0 .  potassium chloride SA (K-DUR,KLOR-CON) 20 MEQ tablet, Take 1 tablet (20 mEq total) by mouth daily., Disp: 90 tablet, Rfl: 0 .  QUEtiapine (SEROQUEL) 50 MG tablet, Take 1 tablet (50 mg total) by mouth at bedtime., Disp: 30 tablet, Rfl: 0 .  [DISCONTINUED] promethazine (PHENERGAN) 25 MG suppository, Place 1  suppository (25 mg total) rectally every 6 (six) hours as needed for nausea., Disp: 12 each, Rfl: 1  Axis Diagnosis:  Major depression, recurrent moderate.  Generalized anxiety disorder   Level of Care:  IOP  Discharge destination:  Other:  has appointments with provider and therapist  Is patient on multiple antipsychotic therapies at discharge:  No    Has Patient had three or more failed trials of antipsychotic monotherapy by history:  Negative  Patient phone:  [phone] (home)  Patient address:   [place] [address],   Follow-up recommendations:  Activity:  continue current activity Diet:  continue current diet  Comments:  none  The patient received suicide prevention pamphlet:  Yes   Donnelly Angelica 06/18/2015, 12:10 PM

## 2015-06-19 ENCOUNTER — Other Ambulatory Visit (HOSPITAL_COMMUNITY): Payer: 59 | Admitting: Psychiatry

## 2015-06-19 DIAGNOSIS — I10 Essential (primary) hypertension: Secondary | ICD-10-CM | POA: Diagnosis not present

## 2015-06-19 DIAGNOSIS — F331 Major depressive disorder, recurrent, moderate: Secondary | ICD-10-CM | POA: Diagnosis not present

## 2015-06-19 DIAGNOSIS — Z87891 Personal history of nicotine dependence: Secondary | ICD-10-CM | POA: Diagnosis not present

## 2015-06-19 DIAGNOSIS — F411 Generalized anxiety disorder: Secondary | ICD-10-CM | POA: Diagnosis not present

## 2015-06-19 DIAGNOSIS — F410 Panic disorder [episodic paroxysmal anxiety] without agoraphobia: Secondary | ICD-10-CM | POA: Diagnosis not present

## 2015-06-19 NOTE — Progress Notes (Signed)
Brandy [name] is a 50 yr old, twice married, employed, [place] American female; who was referred per Dr. Salem Senate; treatment for worsening anxiety and depressive symptoms. Denied HI; but admitted to SI (no plan or intent). Discussed safety options at length with pt. Pt was able to contract for safety. Stressor/Trigger: 1) Health Issue: In October 2016, pt was admitted to [facility] due to pulmonary edema and hypertension. According to pt, she ran out of her medications and didn't get them refilled once she was released. Pt had been increasingly anxious and ruminated about dying in her sleep like her parents did. Apparently, her parents didn't die in their sleep, but thery were not ill and her mother suddenly died and a few months later her father died. Stated she had been gaining weight (~ 4 pounds a week) and she's worried about that. "I feel like I have fluid building in my chest area; although the doctors are telling me that I am fine." Pt stated due to her depression, it is affecting her relationships. Pt lashes out to her family and her coworkers. According to pt, she was admitted at [facility] in the 1990's. Stated she was diagnosed with Bipolar Disorder. Denied any prior suicide attempts or gestures. Has seen Dr. Salem Senate three times and a hx of seeing a therapist. Family Hx: Mother (ETOH), Maternal Grandmother Manufacturing engineer D/O). Pt completed MH-IOP today.  Reports feeling much better overall; but continues to struggle with A/V hallucinations at times.  States sleep has improved.  Sleeping ~ six hours during the night.  "I don't need much sleep."  Pt reports feeling anxious about returning to work on 06-29-15.  Pt received a card from a coworker yesterday in the mail; which she stated was very touching for her.  Pt states that the program was helpful.  A:  D/C today.  F/U with Dr. Salem Senate on 06-23-15 @ 2:20 pm and Audelia Acton, LCSW on 07-14-15 @ 2:30 pm.   Encouraged support groups.  RTW on 06-29-15; without any restrictions.  R:  Pt receptive.         Carlis Abbott, RITA, M.Ed, CNA

## 2015-06-19 NOTE — Patient Instructions (Addendum)
Patient completed MH-IOP today.  Will follow up with Dr. Salem Senate on 06-23-15 @ 2:20 pm and Audelia Acton, LCSW on 07-14-15 @ 2:30 pm.  Encouraged support groups.  Return to work on 06-29-15; without any restrictions.

## 2015-06-22 ENCOUNTER — Other Ambulatory Visit (HOSPITAL_COMMUNITY): Payer: 59

## 2015-06-22 NOTE — Progress Notes (Signed)
    Daily Group Progress Note  Program: IOP  Group Time: 9:00-10:30  Participation Level: Active  Behavioral Response: Appropriate  Type of Therapy:  Group Therapy  Summary of Progress: Pt. Prepared for discharge. Pt. Reported that she felt less depressed, less tearful, and hopeful about returning to work. Pt. Shared that she understood that some of her anxiety had been caused by her physical health crisis and not taking enough time to heal. Pt. Shared progress that she had made in setting healthy boundaries with her family regarding excessive care-giving.     Group Time: 10:30-12:00  Participation Level:  Active  Behavioral Response: Appropriate  Type of Therapy: Psycho-education Group  Summary of Progress: Pt. Participated in grief and loss facilitated by Jeanella Craze.  Nancie Neas, LPC

## 2015-06-22 NOTE — Progress Notes (Signed)
    Daily Group Progress Note  Program: IOP  Group Time: 9:00-10:30  Participation Level: Active  Behavioral Response: Appropriate  Type of Therapy:  Group Therapy  Summary of Progress: Pt. Met with case manager and psychiatrist regarding discharge planning. Pt. Reported her readiness to discharge from group. Pt. Reported that she was less anxious at home and felt ready to go back to work.     Group Time:  10:30-12:00  Participation Level:  Active  Behavioral Response: Appropriate  Type of Therapy: Psycho-education Group  Summary of Progress: Pt. Watched and discussed Shawn Achor about developing behaviors to support wellness i.e., 3 gratitudes, journaling, exercise, meditation, and random acts of kindness.    Nancie Neas, LPC

## 2015-06-23 ENCOUNTER — Ambulatory Visit (HOSPITAL_COMMUNITY): Payer: Self-pay | Admitting: Psychiatry

## 2015-06-23 ENCOUNTER — Other Ambulatory Visit (HOSPITAL_COMMUNITY): Payer: 59

## 2015-06-24 ENCOUNTER — Other Ambulatory Visit (HOSPITAL_COMMUNITY): Payer: 59

## 2015-07-03 ENCOUNTER — Encounter (HOSPITAL_COMMUNITY): Payer: Self-pay | Admitting: Psychiatry

## 2015-07-03 ENCOUNTER — Ambulatory Visit (INDEPENDENT_AMBULATORY_CARE_PROVIDER_SITE_OTHER): Payer: 59 | Admitting: Psychiatry

## 2015-07-03 VITALS — BP 153/98 | HR 76 | Ht 62.0 in | Wt 192.8 lb

## 2015-07-03 DIAGNOSIS — F331 Major depressive disorder, recurrent, moderate: Secondary | ICD-10-CM | POA: Diagnosis not present

## 2015-07-03 DIAGNOSIS — F411 Generalized anxiety disorder: Secondary | ICD-10-CM

## 2015-07-03 MED ORDER — QUETIAPINE FUMARATE 50 MG PO TABS: 50.0000 mg | ORAL_TABLET | Freq: Every day | ORAL | Status: DC

## 2015-07-03 MED ORDER — ALPRAZOLAM 0.25 MG PO TABS: 0.2500 mg | ORAL_TABLET | Freq: Three times a day (TID) | ORAL | Status: DC

## 2015-07-03 MED ORDER — ESCITALOPRAM OXALATE 20 MG PO TABS: 30.0000 mg | ORAL_TABLET | Freq: Every day | ORAL | Status: DC

## 2015-07-03 NOTE — Progress Notes (Signed)
[facility] MD Progress Note  Patient Identification: HRISTINA STRIANO MRN:  [id number] Date of Evaluation:  07/03/2015   Subjective--- I feel exhausted  Visit Diagnosis:    ICD-9-CM ICD-10-CM   1. Moderate episode of recurrent major depressive disorder (HCC) 296.32 F33.1   2. Generalized anxiety disorder 300.02 F41.1    Diagnosis:   Patient Active Problem List   Diagnosis Date Noted  . Major depression, recurrent ([place]) [F33.9] 03/10/2015    Priority: High  . Generalized anxiety disorder [F41.1] 03/10/2015    Priority: High  . Bipolar disorder ([place]) [F31.9] 04/29/2015  . Insomnia [G47.00] 03/13/2015  . Dizziness and giddiness [R42] 02/20/2015  . GERD (gastroesophageal reflux disease) [K21.9] 02/20/2015  . Abnormal CXR [R93.8]   . Dyspnea [R06.00] 02/02/2015  . Hypoxia [R09.02] 02/02/2015  . Acute pulmonary edema ([place]) [J81.0] 02/02/2015  . Hypertensive urgency [I16.0] 02/02/2015  . Hypertensive crisis [I16.9]   . Nausea with vomiting [R11.2] 06/09/2011  . HTN (hypertension) [I10] 12/02/2010  . Diverticulitis of sigmoid colon [K57.32] 12/02/2010  . Tobacco dependence [F17.200] 12/02/2010   History of Present Illness:-------------patient seen today for medication follow-up, states that she Has finished the IOP program which was very helpful for her then she had a week off, is now recovering from the flu. Patient states that she had a very difficult time returning to work was very anxious and had 3-4 panic attacks.  She states that Dr. Lovena Le discontinued her Abilify and put her on Seroquel 50 mg at bedtime. Patient feels a little groggy but states that she was concerned about her weight gain so he discontinued the Abilify.  States that her sleep is good, appetite is good mood is still dysphoric and anxious has panic attacks for which she takes Xanax. Occasionally feels hopeless but not as much as she did before. Denies suicidal or homicidal ideation no hallucinations or  delusions.  Patient and her daughter are dealing with the death of her 34 year old daughter's father who died suddenly. Encourage patient to go visit her aunt in [place] by herself so that she gets a little time off patient stated understanding.  Discussed continuing the medications and she is willing to do so.                                                                         Notes from initial visit on 03/10/15   Patient is a 50 year old African-American married female mother of 3 children age 50 and 50. Patient works as a Electrical engineer at [employer]. Patient was hospitalized on 02/01/2015 at [facility] because of pulmonary edema and essential hypertension. She was discharged and has a follow-up appointment with Dr. Jarold Song. There has been some confusion about the medications she states that she ran out of some of her medications and did not get a refill.Patient is here today because she states she is afraid of going to sleep for fear of dying. States that she is depressed anxious worry that she'll die in her sleep like her parents did. Her parents did not die in the sleep but they were not ill and her mother died suddenly and followed a few months later by her father. Being hospitalized at [facility] has brought  that fear and she was released constantly about it. Patient has become very irritable with the family members, unable to sleep appetite has increased she's been eating more lately. Depressed and irritated very anxious ruminates about dying has panic attacks. Patient is tearful states that she can't remember things tends to forget easily has normal energy no motivation and is anhedonic. Also feels hopeless and helpless denies suicidal ideation now homicidal ideation no hallucinations or delusions.Today patient's blood pressure was 180/102, pulse was 79. Patient states she was 167 pounds when discharged from [facility] and today her weight is 179 pounds. Patient  is concerned about. I tried reaching Dr. Farris Has unsuccessfullyPatient also stated that she was not taking her Lasix.   Substance Abuse History in the last 12 months:  Yes.   patient smoked a pack of cigarettes per day from the age of 38 and quit a month ago. She drinks a glass of wine on weekends.   Consequences of Substance Abuse: Medical Consequences:  Pulmonary edema, essential hypertension   Past psychiatric history. Patient states her first episode of depression was as a Equities trader in high school. She did not receive any treatment. Her second episode of depression was in Jul 20, 1997 after her mother died suddenly. She went to [facility] from 1988/07/20 12/05/1998 and was treated with Zoloft and Wellbutrin and therapy.    Past Medical History: Recently hospitalized at [facility] on 02/01/2015 for essential hypertension and pulmonary edema. Past Medical History  Diagnosis Date  . Hypertension   . Osteoarthritis   . Depression   . Gallstones   . Anxiety     Past Surgical History  Procedure Laterality Date  . Gallbladder surgery     Family History: Maternal grandmother had mental illness, mom had depression and alcohol problems. Family History  Problem Relation Age of Onset  . Coronary artery disease Paternal Grandmother     young age  . Diabetes Paternal Grandmother   . Hypertension Father   . Diabetes Sister   . Alcohol abuse Mother   . Schizophrenia Maternal Grandmother    Social History:   she is presently married and lives with her husband and 79 year old daughter in [place]. Patient works as a Electrical engineer at [employer]  . Marital Status: Married    Spouse Name: N/A  . Number of Children: 3  . Years of Education: N/A   Occupational History  . Nurse [employer]   Social History Main Topics  . Smoking status: Former Smoker -- 0.50 packs/day for 1.5 years    Types: Cigarettes    Quit date: 01/03/2015  . Smokeless tobacco:  Never Used  . Alcohol Use: Yes     Comment: Weekends.   . Drug Use: Yes    Special: Marijuana     Comment: THC  . Sexual Activity: Yes   Other Topics Concern  . None   Social History Narrative   Additional Social History: Patient was born and raised in [place]. States she did well in school but was a disruptive child and so had to sit in the hallway most of the time. She graduated high school and joined Market researcher became pregnant. Patient has 3 children. They have different fathers  Musculoskeletal: Strength & Muscle Tone: within normal limits Gait & Station: normal Patient leans: N/A and Stand straight  Psychiatric Specialty Exam: HPI  Review of Systems  Psychiatric/Behavioral: Positive for depression. The patient is nervous/anxious and has  insomnia.   All other systems reviewed and are negative.   Blood pressure 153/98, pulse 76, height 5\' 2"  (1.575 m), weight 192 lb 12.8 oz (87.454 kg).Body mass index is 35.25 kg/(m^2).  General Appearance: Casual and Fairly Groomed  Eye Contact:  Fair  Speech:  Clear and Coherent and Normal Rate  Volume:  Normal   Mood:  Anxious and dysphoric   Affect:  Constricted   Thought Process:  Goal Directed, Linear and Logical  Orientation:  Full (Time, Place, and Person)  Thought Content:  Rumination  Suicidal Thoughts:  No  Homicidal Thoughts:  No  Memory:  Immediate;   Fair Recent;   Good Remote;   Good  Judgement:  Good  Insight:  Good  Psychomotor Activity:  Normal  Concentration:  Fair  Recall:  [place] of Knowledge:Good  Language: Good  Akathisia:  No  Handed:  Right  AIMS (if indicated):  0  Assets:  Communication Skills Desire for Improvement Financial Resources/Insurance Housing Resilience Social Support Transportation Vocational/Educational  ADL's:  Intact  Cognition: WNL  Sleep:  Poor    Is the patient at risk to self?  No. Has the patient been a risk to self in the past 6 months?  No. Has the patient  been a risk to self within the distant past?  No. Is the patient a risk to others?  No. Has the patient been a risk to others in the past 6 months?  No. Has the patient been a risk to others within the distant past?  No.  Allergies:  As listed below Allergies  Allergen Reactions  . Azithromycin Other (See Comments)    "Stomach cramps"  . Contrast Media [Iodinated Diagnostic Agents] Hives  . 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"   Current Medications: Current Outpatient Prescriptions  Medication Sig Dispense Refill  . ALPRAZolam (XANAX) 0.25 MG tablet Take 1 tablet (0.25 mg total) by mouth 3 (three) times daily. 90 tablet 0  . aspirin EC 81 MG EC tablet Take 1 tablet (81 mg total) by mouth daily.    . carvedilol (COREG) 25 MG tablet Take 1 tablet (25 mg total) by mouth 2 (two) times daily with a meal. Discontinue 6.25mg  60 tablet 2  . escitalopram (LEXAPRO) 20 MG tablet Take 1.5 tablets (30 mg total) by mouth daily. 45 tablet 2  . furosemide (LASIX) 20 MG tablet Take 1 tablet (20 mg total) by mouth daily. 90 tablet 0  . guaiFENesin (MUCINEX) 600 MG 12 hr tablet Take 600 mg by mouth 2 (two) times daily as needed (for cold symptoms).    . meclizine (ANTIVERT) 25 MG tablet TAKE 1 TABLET BY MOUTH 3 TIMES DAILY AS NEEDED. 30 tablet 2  . pantoprazole (PROTONIX) 40 MG tablet Take 1 tablet (40 mg total) by mouth daily. 90 tablet 0  . potassium chloride SA (K-DUR,KLOR-CON) 20 MEQ tablet Take 1 tablet (20 mEq total) by mouth daily. 90 tablet 0  . QUEtiapine (SEROQUEL) 50 MG tablet Take 1 tablet (50 mg total) by mouth at bedtime. 30 tablet 0  . [DISCONTINUED] promethazine (PHENERGAN) 25 MG suppository Place 1 suppository (25 mg total) rectally every 6 (six) hours as needed for nausea. 12 each 1   No current facility-administered medications for this visit.    Previous Psychotropic Medications: Yes     Medical Decision Making:  Review of Psycho-Social  Stressors (1), Review or order clinical lab tests (1), Discuss test  with performing physician (1), Established Problem, Worsening (2), New Problem, with no additional work-up planned (3), Review of Medication Regimen & Side Effects (2) and Review of New Medication or Change in Dosage (2)  Treatment Plan Summary: Medication management #1 major depression recurrent severe DC Abilify. Since this was discontinued in IOP. Marland Kitchen Continues Seroquel 50 mg by mouth daily at bedtime.  Continue Lexapro 30 mg by mouth every morning.  #2 generalized anxiety disorder. Treat with Lexapro and Seroquel  Continue Xanax to 0.25 mg by mouth 3 times a day to help her anxiety.   #3 labs None at this visit.  # 4 therapy None at this visit.  #5 discussed with the patient that I would be leaving the clinic and that her care will be transferred over to Dr. Adele Schilder, she stated understanding. Patient will return to see him and 6 weeks.  This visit was 25 minutes. More than 50% of the time was spent in counseling . Discussing medications and emphasizing med compliance. Interpersonal and supportive therapy was provided along with cognitive behavior therapy. For anxiety and depression Erin Sons 3/31/20179:32 AM

## 2015-07-06 ENCOUNTER — Ambulatory Visit (HOSPITAL_COMMUNITY): Payer: Self-pay | Admitting: Clinical

## 2015-07-14 ENCOUNTER — Ambulatory Visit (HOSPITAL_COMMUNITY): Payer: Self-pay | Admitting: Clinical

## 2015-08-13 ENCOUNTER — Ambulatory Visit (HOSPITAL_COMMUNITY): Payer: Self-pay | Admitting: Psychiatry

## 2015-09-01 ENCOUNTER — Other Ambulatory Visit: Payer: Self-pay | Admitting: Family Medicine

## 2015-09-01 DIAGNOSIS — F3162 Bipolar disorder, current episode mixed, moderate: Secondary | ICD-10-CM

## 2015-09-01 DIAGNOSIS — K219 Gastro-esophageal reflux disease without esophagitis: Secondary | ICD-10-CM

## 2015-09-01 DIAGNOSIS — J810 Acute pulmonary edema: Secondary | ICD-10-CM

## 2015-09-01 MED ORDER — QUETIAPINE FUMARATE 50 MG PO TABS: 50.0000 mg | ORAL_TABLET | Freq: Every day | ORAL | Status: DC

## 2015-09-01 MED ORDER — MECLIZINE HCL 25 MG PO TABS: 25.0000 mg | ORAL_TABLET | Freq: Three times a day (TID) | ORAL | Status: DC | PRN

## 2015-09-01 MED ORDER — ALPRAZOLAM 0.25 MG PO TABS: 0.2500 mg | ORAL_TABLET | Freq: Three times a day (TID) | ORAL | Status: DC

## 2015-09-01 MED ORDER — PANTOPRAZOLE SODIUM 40 MG PO TBEC: 40.0000 mg | DELAYED_RELEASE_TABLET | Freq: Every day | ORAL | Status: DC

## 2015-09-01 MED ORDER — ESCITALOPRAM OXALATE 20 MG PO TABS: 30.0000 mg | ORAL_TABLET | Freq: Every day | ORAL | Status: DC

## 2015-09-01 MED ORDER — FUROSEMIDE 20 MG PO TABS: 20.0000 mg | ORAL_TABLET | Freq: Every day | ORAL | Status: DC

## 2015-09-01 MED ORDER — POTASSIUM CHLORIDE CRYS ER 20 MEQ PO TBCR: 20.0000 meq | EXTENDED_RELEASE_TABLET | Freq: Every day | ORAL | Status: DC

## 2015-09-01 MED ORDER — CARVEDILOL 25 MG PO TABS: 25.0000 mg | ORAL_TABLET | Freq: Two times a day (BID) | ORAL | Status: DC

## 2015-09-01 NOTE — Telephone Encounter (Signed)
Patient needs xanax, seroquel, meclizine, lexapro, carvedilol Other current medications. Please follow up

## 2015-09-01 NOTE — Telephone Encounter (Signed)
Xanax is ready for pickup, other meds sent to the pharmacy. A new referral has been placed to psychiatry.

## 2015-09-25 ENCOUNTER — Ambulatory Visit: Payer: Self-pay | Admitting: Family Medicine

## 2015-10-08 ENCOUNTER — Emergency Department (HOSPITAL_COMMUNITY)
Admission: EM | Admit: 2015-10-08 | Discharge: 2015-10-08 | Disposition: A | Discharge status: Home / Self Care | Payer: 59 | Attending: Emergency Medicine | Admitting: Emergency Medicine

## 2015-10-08 ENCOUNTER — Encounter (HOSPITAL_COMMUNITY): Payer: Self-pay

## 2015-10-08 DIAGNOSIS — I10 Essential (primary) hypertension: Secondary | ICD-10-CM | POA: Diagnosis not present

## 2015-10-08 DIAGNOSIS — Z7982 Long term (current) use of aspirin: Secondary | ICD-10-CM | POA: Diagnosis not present

## 2015-10-08 DIAGNOSIS — Z79899 Other long term (current) drug therapy: Secondary | ICD-10-CM | POA: Insufficient documentation

## 2015-10-08 DIAGNOSIS — R42 Dizziness and giddiness: Secondary | ICD-10-CM | POA: Diagnosis present

## 2015-10-08 DIAGNOSIS — Z87891 Personal history of nicotine dependence: Secondary | ICD-10-CM | POA: Insufficient documentation

## 2015-10-08 LAB — CBC WITH DIFFERENTIAL/PLATELET
BASOS ABS: 0 10*3/uL (ref 0.0–0.1)
Basophils Relative: 0 %
Eosinophils Absolute: 0.2 10*3/uL (ref 0.0–0.7)
Eosinophils Relative: 3 %
HEMATOCRIT: 37.6 % (ref 36.0–46.0)
Hemoglobin: 12.2 g/dL (ref 12.0–15.0)
LYMPHS PCT: 32 %
Lymphs Abs: 1.8 10*3/uL (ref 0.7–4.0)
MCH: 28.6 pg (ref 26.0–34.0)
MCHC: 32.4 g/dL (ref 30.0–36.0)
MCV: 88.1 fL (ref 78.0–100.0)
MONO ABS: 0.2 10*3/uL (ref 0.1–1.0)
Monocytes Relative: 4 %
NEUTROS ABS: 3.4 10*3/uL (ref 1.7–7.7)
Neutrophils Relative %: 61 %
Platelets: 220 10*3/uL (ref 150–400)
RBC: 4.27 MIL/uL (ref 3.87–5.11)
RDW: 15.1 % (ref 11.5–15.5)
WBC: 5.7 10*3/uL (ref 4.0–10.5)

## 2015-10-08 LAB — COMPREHENSIVE METABOLIC PANEL
ALBUMIN: 3.4 g/dL — AB (ref 3.5–5.0)
ALK PHOS: 59 U/L (ref 38–126)
ALT: 17 U/L (ref 14–54)
AST: 20 U/L (ref 15–41)
Anion gap: 4 — ABNORMAL LOW (ref 5–15)
BUN: 13 mg/dL (ref 6–20)
CALCIUM: 9.4 mg/dL (ref 8.9–10.3)
CO2: 26 mmol/L (ref 22–32)
Chloride: 106 mmol/L (ref 101–111)
Creatinine, Ser: 0.99 mg/dL (ref 0.44–1.00)
GFR calc Af Amer: >60 mL/min (ref >60)
GFR calc non Af Amer: >60 mL/min (ref >60)
GLUCOSE: 104 mg/dL — AB (ref 65–99)
Potassium: 3.7 mmol/L (ref 3.5–5.1)
Sodium: 136 mmol/L (ref 135–145)
TOTAL PROTEIN: 6.7 g/dL (ref 6.5–8.1)
Total Bilirubin: 0.4 mg/dL (ref 0.3–1.2)

## 2015-10-08 LAB — TROPONIN I: Troponin I: <0.03 ng/mL (ref <0.03)

## 2015-10-08 MED ORDER — SODIUM CHLORIDE 0.9 % IV BOLUS (SEPSIS)
1000.0000 mL | Freq: Once | INTRAVENOUS | Status: AC
  Administered 2015-10-08: 1000 mL via INTRAVENOUS

## 2015-10-08 NOTE — ED Notes (Signed)
Pt. At work and took her BP and reports it being [id number] systolic. Pt. Director sent her to the ED for evaluation. Pt. sts over the 4th she felt lightheaded when she stood up. Pt. Aox4 and denies headache.

## 2015-10-08 NOTE — Discharge Instructions (Signed)
Make sure you take your blood pressure medicines daily. Recommend a blood pressure journal with documentation of your blood pressure numbers. Stop smoking. Increase fluids. Follow-up your primary care doctor.

## 2015-10-08 NOTE — ED Provider Notes (Signed)
CSN: [id number]     Arrival date & time 10/08/15  0712 History   First MD Initiated Contact with Patient 10/08/15 [phone]     Chief Complaint  Patient presents with  . Hypertension     (Consider location/radiation/quality/duration/timing/severity/associated sxs/prior Treatment) HPI......Marland KitchenPatient reports getting overheated on July 4. She felt very lightheaded especially when moving her head vigorously. She felt like she was going to pass out. Her blood pressures has been running on the high side. Today it was 212/113 at work. She claims be taking her blood pressure medicine. No substernal chest pain, dyspnea, neurological deficits. She admits to starting smoking again the past couple months.  Past Medical History  Diagnosis Date  . Hypertension   . Osteoarthritis   . Depression   . Gallstones   . Anxiety    Past Surgical History  Procedure Laterality Date  . Gallbladder surgery     Family History  Problem Relation Age of Onset  . Coronary artery disease Paternal Grandmother     young age  . Diabetes Paternal Grandmother   . Hypertension Father   . Diabetes Sister   . Alcohol abuse Mother   . Schizophrenia Maternal Grandmother    Social History  Substance Use Topics  . Smoking status: Former Smoker -- 0.50 packs/day for 1.5 years    Types: Cigarettes    Quit date: 01/03/2015  . Smokeless tobacco: Never Used  . Alcohol Use: Yes     Comment: Weekends.    OB History    No data available     Review of Systems  All other systems reviewed and are negative.     Allergies  Azithromycin; Contrast media; and Shellfish allergy  Home Medications   Prior to Admission medications   Medication Sig Start Date End Date Taking? Authorizing Provider  aspirin EC 81 MG EC tablet Take 1 tablet (81 mg total) by mouth daily. 02/05/15  Yes Domenic Polite, MD  calcium carbonate (TUMS - DOSED IN MG ELEMENTAL CALCIUM) 500 MG chewable tablet Chew 5 tablets by mouth 2 (two) times daily as  needed for indigestion or heartburn.   Yes Historical Provider, MD  carvedilol (COREG) 25 MG tablet Take 1 tablet (25 mg total) by mouth 2 (two) times daily with a meal. Must have OV 09/01/15  Yes Arnoldo Morale, MD  escitalopram (LEXAPRO) 20 MG tablet Take 1.5 tablets (30 mg total) by mouth daily. 09/01/15 08/31/16 Yes Arnoldo Morale, MD  furosemide (LASIX) 20 MG tablet Take 1 tablet (20 mg total) by mouth daily. Must have OV 09/01/15  Yes Arnoldo Morale, MD  guaiFENesin (MUCINEX) 600 MG 12 hr tablet Take 600 mg by mouth 2 (two) times daily as needed (for cold symptoms).   Yes Historical Provider, MD  ibuprofen (ADVIL,MOTRIN) 200 MG tablet Take 200 mg by mouth every 6 (six) hours as needed for mild pain.   Yes Historical Provider, MD  meclizine (ANTIVERT) 25 MG tablet Take 1 tablet (25 mg total) by mouth 3 (three) times daily as needed for dizziness. Must have OV 09/01/15  Yes Arnoldo Morale, MD  Multiple Vitamins-Minerals (MULTI ADULT GUMMIES) CHEW Chew 2 tablets by mouth 2 (two) times daily.   Yes Historical Provider, MD  pantoprazole (PROTONIX) 40 MG tablet Take 1 tablet (40 mg total) by mouth daily. Patient taking differently: Take 40 mg by mouth daily as needed (HEARTBURN).  09/01/15  Yes Arnoldo Morale, MD  potassium chloride SA (K-DUR,KLOR-CON) 20 MEQ tablet Take 1 tablet (20 mEq total) by  mouth daily. Patient taking differently: Take 10 mEq by mouth daily.  09/01/15  Yes Arnoldo Morale, MD  QUEtiapine (SEROQUEL) 50 MG tablet Take 1 tablet (50 mg total) by mouth at bedtime. 09/01/15 08/31/16 Yes Arnoldo Morale, MD  ALPRAZolam (XANAX) 0.25 MG tablet Take 1 tablet (0.25 mg total) by mouth 3 (three) times daily. 09/01/15   Arnoldo Morale, MD   BP 145/90 mmHg  Pulse 69  Temp(Src) 98.1 F (36.7 C) (Oral)  Resp 16  SpO2 100% Physical Exam  Constitutional: She is oriented to person, place, and time. She appears well-developed and well-nourished.  No acute distress, blood pressure elevated.  HENT:  Head:  Normocephalic and atraumatic.  Eyes: Conjunctivae and EOM are normal. Pupils are equal, round, and reactive to light.  Neck: Normal range of motion. Neck supple.  Cardiovascular: Normal rate and regular rhythm.   Pulmonary/Chest: Effort normal and breath sounds normal.  Abdominal: Soft. Bowel sounds are normal.  Musculoskeletal: Normal range of motion.  Neurological: She is alert and oriented to person, place, and time.  Skin: Skin is warm and dry.  Psychiatric: She has a normal mood and affect. Her behavior is normal.  Nursing note and vitals reviewed.   ED Course  Procedures (including critical care time) Labs Review Labs Reviewed  COMPREHENSIVE METABOLIC PANEL - Abnormal; Notable for the following:    Glucose, Bld 104 (*)    Albumin 3.4 (*)    Anion gap 4 (*)    All other components within normal limits  CBC WITH DIFFERENTIAL/PLATELET  TROPONIN I    Imaging Review No results found. I have personally reviewed and evaluated these images and lab results as part of my medical decision-making.   EKG Interpretation   Date/Time:  Thursday October 08 2015 07:44:28 EDT Ventricular Rate:  71 PR Interval:    QRS Duration: 101 QT Interval:  396 QTC Calculation: 431 R Axis:   17 Text Interpretation:  Sinus rhythm Confirmed by Erma Raiche  MD, Jesua Tamblyn ([id number]) on  10/08/2015 8:11:17 AM      MDM   Final diagnoses:  Essential hypertension    Patient took her normal blood pressure medicines and her pressure became much improved. She is exhibiting no neurological deficits. EKG was normal sinus rhythm. Patient rechecked before discharge and appears to be back to baseline. Encouraged to stop smoking and take a blood pressure journal    Nat Christen, MD 10/08/15 1436

## 2015-10-26 ENCOUNTER — Telehealth: Payer: Self-pay | Admitting: Family Medicine

## 2015-10-26 DIAGNOSIS — F3162 Bipolar disorder, current episode mixed, moderate: Secondary | ICD-10-CM

## 2015-10-26 DIAGNOSIS — J810 Acute pulmonary edema: Secondary | ICD-10-CM

## 2015-10-26 MED ORDER — ESCITALOPRAM OXALATE 20 MG PO TABS: 30.0000 mg | ORAL_TABLET | Freq: Every day | ORAL | 0 refills | Status: DC

## 2015-10-26 MED ORDER — CARVEDILOL 25 MG PO TABS
25.0000 mg | ORAL_TABLET | Freq: Two times a day (BID) | ORAL | 0 refills | Status: DC
Start: 2015-10-26 — End: 2015-11-16

## 2015-10-26 MED ORDER — POTASSIUM CHLORIDE CRYS ER 20 MEQ PO TBCR: 20.0000 meq | EXTENDED_RELEASE_TABLET | Freq: Every day | ORAL | 0 refills | Status: DC

## 2015-10-26 MED ORDER — FUROSEMIDE 20 MG PO TABS: 20.0000 mg | ORAL_TABLET | Freq: Every day | ORAL | 0 refills | Status: DC

## 2015-10-26 NOTE — Telephone Encounter (Signed)
Pt states she needs a refill on all her medication. Pt did not stat medication names  Pt has an appointment the 31st and states she does not have enough to last till then  Pt uses [facility]

## 2015-10-26 NOTE — Telephone Encounter (Signed)
Medications due for a refill have been refilled - patient needs office visit.

## 2015-11-02 ENCOUNTER — Ambulatory Visit: Payer: Self-pay | Admitting: Family Medicine

## 2015-11-16 ENCOUNTER — Ambulatory Visit: Payer: 59 | Attending: Family Medicine | Admitting: Family Medicine

## 2015-11-16 ENCOUNTER — Encounter: Payer: Self-pay | Admitting: Family Medicine

## 2015-11-16 VITALS — BP 160/92 | HR 76 | Temp 98.6°F | Ht 61.0 in | Wt 200.8 lb

## 2015-11-16 DIAGNOSIS — K219 Gastro-esophageal reflux disease without esophagitis: Secondary | ICD-10-CM

## 2015-11-16 DIAGNOSIS — Z888 Allergy status to other drugs, medicaments and biological substances status: Secondary | ICD-10-CM | POA: Diagnosis not present

## 2015-11-16 DIAGNOSIS — Z79899 Other long term (current) drug therapy: Secondary | ICD-10-CM | POA: Insufficient documentation

## 2015-11-16 DIAGNOSIS — J810 Acute pulmonary edema: Secondary | ICD-10-CM | POA: Diagnosis not present

## 2015-11-16 DIAGNOSIS — M2540 Effusion, unspecified joint: Secondary | ICD-10-CM | POA: Diagnosis not present

## 2015-11-16 DIAGNOSIS — F3162 Bipolar disorder, current episode mixed, moderate: Secondary | ICD-10-CM | POA: Diagnosis not present

## 2015-11-16 DIAGNOSIS — I10 Essential (primary) hypertension: Secondary | ICD-10-CM | POA: Diagnosis not present

## 2015-11-16 DIAGNOSIS — F319 Bipolar disorder, unspecified: Secondary | ICD-10-CM | POA: Diagnosis not present

## 2015-11-16 DIAGNOSIS — R42 Dizziness and giddiness: Secondary | ICD-10-CM | POA: Diagnosis not present

## 2015-11-16 DIAGNOSIS — F410 Panic disorder [episodic paroxysmal anxiety] without agoraphobia: Secondary | ICD-10-CM | POA: Diagnosis not present

## 2015-11-16 DIAGNOSIS — Z7982 Long term (current) use of aspirin: Secondary | ICD-10-CM | POA: Insufficient documentation

## 2015-11-16 MED ORDER — MECLIZINE HCL 25 MG PO TABS: 25.0000 mg | ORAL_TABLET | Freq: Three times a day (TID) | ORAL | 3 refills | Status: DC | PRN

## 2015-11-16 MED ORDER — CARVEDILOL 25 MG PO TABS: 25.0000 mg | ORAL_TABLET | Freq: Two times a day (BID) | ORAL | 3 refills | Status: DC

## 2015-11-16 MED ORDER — ALPRAZOLAM 0.25 MG PO TABS: 0.2500 mg | ORAL_TABLET | Freq: Three times a day (TID) | ORAL | 0 refills | Status: DC

## 2015-11-16 MED ORDER — FUROSEMIDE 20 MG PO TABS: 20.0000 mg | ORAL_TABLET | Freq: Every day | ORAL | 3 refills | Status: DC

## 2015-11-16 MED ORDER — LISINOPRIL-HYDROCHLOROTHIAZIDE 10-12.5 MG PO TABS: 1.0000 | ORAL_TABLET | Freq: Every day | ORAL | 3 refills | Status: DC

## 2015-11-16 MED ORDER — ESCITALOPRAM OXALATE 20 MG PO TABS: 30.0000 mg | ORAL_TABLET | Freq: Every day | ORAL | 1 refills | Status: DC

## 2015-11-16 MED ORDER — PANTOPRAZOLE SODIUM 40 MG PO TBEC: 40.0000 mg | DELAYED_RELEASE_TABLET | Freq: Every day | ORAL | 3 refills | Status: DC

## 2015-11-16 NOTE — Progress Notes (Signed)
Depression/anxiety has become worse.  States that she is having constant "panic attacks"  Medication discussion about the seroquel

## 2015-11-17 NOTE — Progress Notes (Signed)
Subjective:  Patient ID: Brandy [name], female    DOB: Nov 26, 1965  Age: 50 y.o. MRN: [id number]  CC: Hypertension; Depression; Panic Attack; and Joint Swelling   HPI TAYLRE RAISON is a 50 year old female with a history of hypertension, bipolar disorder, vertigo, GERD, previous hospitalization for pulmonary edema who comes into the clinic for a follow-up visit.  She complains of panic attacks, worsening depression and admits to not being compliant with her psych meds. She was last seen by behavioral health in 06/2015 and informs me that her medications did help for some time but then her psychiatrist left the practice; I had placed another Psych referral for her again. On calling behavioral health clinic and was informed that the patient missed several appointments there. The patient states her depression makes it difficult for her to make appointments as she only wants to stay home and this is taking a toll on her marriage as she has anhedonia. Denies suicidal ideations or intents. Complaints of Seroquel causing her crazy dreams.  She has been out of her meclizine and complains of intermittent vertigo. GERD symptoms are controlled. Compliant with antihypertensive but blood pressure is still elevated.  Past Medical History:  Diagnosis Date  . Anxiety   . Depression   . Gallstones   . Hypertension   . Osteoarthritis       Allergies  Allergen Reactions  . Azithromycin Other (See Comments)    "Stomach cramps"  . Contrast Media [Iodinated Diagnostic Agents] Hives  . 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 aspirin EC 81 MG EC tablet Take 1 tablet (81 mg total) by mouth daily.    . calcium carbonate (TUMS - DOSED IN MG ELEMENTAL CALCIUM) 500 MG chewable tablet Chew 5 tablets by mouth 2 (two) times daily as needed for indigestion or heartburn.    Marland Kitchen  guaiFENesin (MUCINEX) 600 MG 12 hr tablet Take 600 mg by mouth 2 (two) times daily as needed (for cold symptoms).    Marland Kitchen ibuprofen (ADVIL,MOTRIN) 200 MG tablet Take 200 mg by mouth every 6 (six) hours as needed for mild pain.    . Multiple Vitamins-Minerals (MULTI ADULT GUMMIES) CHEW Chew 2 tablets by mouth 2 (two) times daily.    . potassium chloride SA (K-DUR,KLOR-CON) 20 MEQ tablet Take 1 tablet (20 mEq total) by mouth daily. 30 tablet 0  . QUEtiapine (SEROQUEL) 50 MG tablet Take 1 tablet (50 mg total) by mouth at bedtime. 30 tablet 0  . ALPRAZolam (XANAX) 0.25 MG tablet Take 1 tablet (0.25 mg total) by mouth 3 (three) times daily. 90 tablet 0  . carvedilol (COREG) 25 MG tablet Take 1 tablet (25 mg total) by mouth 2 (two) times daily with a meal. 60 tablet 0  . escitalopram (LEXAPRO) 20 MG tablet Take 1.5 tablets (30 mg total) by mouth daily. 45 tablet 0  . furosemide (LASIX) 20 MG tablet Take 1 tablet (20 mg total) by mouth daily. 30 tablet 0  . pantoprazole (PROTONIX) 40 MG tablet Take 1 tablet (40 mg total) by mouth daily. (Patient taking differently: Take 40 mg by mouth daily as needed (HEARTBURN). ) 30 tablet 0  . meclizine (ANTIVERT) 25 MG tablet Take 1 tablet (25 mg total) by mouth 3 (three) times daily as needed for dizziness. Must have OV (Patient not taking: Reported on 11/16/2015) 30 tablet 0  No facility-administered medications prior to visit.     ROS Review of Systems  Constitutional: Negative for activity change, appetite change and fatigue.  HENT: Negative for congestion, sinus pressure and sore throat.   Eyes: Negative for visual disturbance.  Respiratory: Negative for cough, chest tightness, shortness of breath and wheezing.   Cardiovascular: Negative for chest pain and palpitations.  Gastrointestinal: Negative for abdominal distention, abdominal pain and constipation.  Endocrine: Negative for polydipsia.  Genitourinary: Negative for dysuria and frequency.    Musculoskeletal: Negative for arthralgias and back pain.  Skin: Negative for rash.  Neurological: Positive for dizziness. Negative for tremors, light-headedness and numbness.  Hematological: Does not bruise/bleed easily.  Psychiatric/Behavioral: Positive for dysphoric mood. Negative for agitation, behavioral problems and suicidal ideas. The patient is nervous/anxious.     Objective:  BP (!) 160/92 (BP Location: Right Arm, Patient Position: Sitting, Cuff Size: Large)   Pulse 76   Temp 98.6 F (37 C) (Oral)   Ht 5\' 1"  (1.549 m)   Wt 200 lb 12.8 oz (91.1 kg)   SpO2 99%   BMI 37.94 kg/m   BP/Weight 11/16/2015 04/29/2015 [id number]  Systolic BP 0000000 [id number]  Diastolic BP 92 90 89  Wt. (Lbs) 200.8 188.4 179  BMI 37.94 34.45 32.73  Some encounter information is confidential and restricted. Go to Review Flowsheets activity to see all data.      Physical Exam  Constitutional: She is oriented to person, place, and time. She appears well-developed and well-nourished.  Cardiovascular: Normal rate, normal heart sounds and intact distal pulses.   No murmur heard. Pulmonary/Chest: Effort normal and breath sounds normal. She has no wheezes. She has no rales. She exhibits no tenderness.  Abdominal: Soft. Bowel sounds are normal. She exhibits no distension and no mass. There is no tenderness.  Musculoskeletal: Normal range of motion.  Neurological: She is alert and oriented to person, place, and time.  Psychiatric: Her mood appears anxious. She exhibits a depressed mood.    CMP Latest Ref Rng & Units 10/08/2015 04/29/2015 02/20/2015  Glucose 65 - 99 mg/dL 104(H) 100(H) 74  BUN 6 - 20 mg/dL 13 16 16   Creatinine 0.44 - 1.00 mg/dL 0.99 0.93 0.91  Sodium 135 - 145 mmol/L 136 138 140  Potassium 3.5 - 5.1 mmol/L 3.7 4.6 4.4  Chloride 101 - 111 mmol/L 106 100 103  CO2 22 - 32 mmol/L 26 26 27   Calcium 8.9 - 10.3 mg/dL 9.4 9.2 9.7  Total Protein 6.5 - 8.1 g/dL 6.7 - -  Total Bilirubin 0.3 - 1.2  mg/dL 0.4 - -  Alkaline Phos 38 - 126 U/L 59 - -  AST 15 - 41 U/L 20 - -  ALT 14 - 54 U/L 17 - -      Assessment & Plan:   1. Bipolar disorder, current episode mixed, moderate ([place]) Lost to Psych follow up due to patient being a 'no - show'. We have called Behavioral health and early appointment is not until 01/11/16; we will keep working on obtaining and earlier appointment. Hold off on seroquel due to side effects experienced by the patient. - ALPRAZolam (XANAX) 0.25 MG tablet; Take 1 tablet (0.25 mg total) by mouth 3 (three) times daily.  Dispense: 90 tablet; Refill: 0 - escitalopram (LEXAPRO) 20 MG tablet; Take 1.5 tablets (30 mg total) by mouth daily.  Dispense: 45 tablet; Refill: 1  2. Gastroesophageal reflux disease without esophagitis Stable - pantoprazole (PROTONIX) 40 MG tablet; Take 1 tablet (40  mg total) by mouth daily.  Dispense: 30 tablet; Refill: 3  3. Acute pulmonary edema (HCC) Resolved No pedal edema - furosemide (LASIX) 20 MG tablet; Take 1 tablet (20 mg total) by mouth daily.  Dispense: 30 tablet; Refill: 3  4. Dizziness and giddiness Uncontrolled due to being off meclizine Refilled meclizine  5. Essential hypertension Uncontrolled Lisinopril/hctz added to regimen   Meds ordered this encounter  Medications  . ALPRAZolam (XANAX) 0.25 MG tablet    Sig: Take 1 tablet (0.25 mg total) by mouth 3 (three) times daily.    Dispense:  90 tablet    Refill:  0  . escitalopram (LEXAPRO) 20 MG tablet    Sig: Take 1.5 tablets (30 mg total) by mouth daily.    Dispense:  45 tablet    Refill:  1  . carvedilol (COREG) 25 MG tablet    Sig: Take 1 tablet (25 mg total) by mouth 2 (two) times daily with a meal.    Dispense:  60 tablet    Refill:  3    Must have office visit for refills  . meclizine (ANTIVERT) 25 MG tablet    Sig: Take 1 tablet (25 mg total) by mouth 3 (three) times daily as needed for dizziness.    Dispense:  30 tablet    Refill:  3  . pantoprazole  (PROTONIX) 40 MG tablet    Sig: Take 1 tablet (40 mg total) by mouth daily.    Dispense:  30 tablet    Refill:  3  . furosemide (LASIX) 20 MG tablet    Sig: Take 1 tablet (20 mg total) by mouth daily.    Dispense:  30 tablet    Refill:  3  . lisinopril-hydrochlorothiazide (PRINZIDE,ZESTORETIC) 10-12.5 MG tablet    Sig: Take 1 tablet by mouth daily.    Dispense:  30 tablet    Refill:  3    Follow-up: Return in about 3 weeks (around 12/07/2015) for Follow-up of hypertension.   Arnoldo Morale MD

## 2015-12-08 ENCOUNTER — Telehealth: Payer: Self-pay | Admitting: Family Medicine

## 2015-12-08 MED ORDER — POTASSIUM CHLORIDE CRYS ER 20 MEQ PO TBCR: 20.0000 meq | EXTENDED_RELEASE_TABLET | Freq: Every day | ORAL | 0 refills | Status: DC

## 2015-12-08 NOTE — Telephone Encounter (Signed)
Refilled potassium x 30 days - patient due for follow up with Dr. Jarold Song.

## 2015-12-08 NOTE — Telephone Encounter (Signed)
Pt called requesting medication refill on potassium chloride SA (K-DUR,KLOR-CON) 20 MEQ tablet States legs are cramping, please f/up

## 2015-12-09 ENCOUNTER — Ambulatory Visit: Payer: Self-pay | Admitting: Family Medicine

## 2016-01-11 ENCOUNTER — Ambulatory Visit (HOSPITAL_COMMUNITY): Payer: Self-pay | Admitting: Psychiatry

## 2016-01-27 ENCOUNTER — Telehealth: Payer: Self-pay | Admitting: Family Medicine

## 2016-01-27 NOTE — Telephone Encounter (Signed)
Patient is taking lisinopril and believes is causing her to have a persistent cough, she would like to know if she should stop medication.  Please advised

## 2016-01-28 MED ORDER — LOSARTAN POTASSIUM-HCTZ 50-12.5 MG PO TABS: 1.0000 | ORAL_TABLET | Freq: Every day | ORAL | 3 refills | Status: DC

## 2016-01-28 NOTE — Telephone Encounter (Signed)
I have switched lisinopril/HCTZ to losartan/HCTZ as lisinopril could cause a dry cough; prescription sent to pharmacy.

## 2016-01-28 NOTE — Telephone Encounter (Signed)
Writer called and LVM for patient that a new prescription was sent to her pharmacy.

## 2016-02-09 ENCOUNTER — Ambulatory Visit: Payer: Self-pay | Admitting: Family Medicine

## 2016-02-15 ENCOUNTER — Ambulatory Visit: Payer: Self-pay | Admitting: Family Medicine

## 2016-02-15 ENCOUNTER — Telehealth: Payer: Self-pay | Admitting: Family Medicine

## 2016-02-15 DIAGNOSIS — J810 Acute pulmonary edema: Secondary | ICD-10-CM

## 2016-02-15 MED ORDER — FUROSEMIDE 20 MG PO TABS: 20.0000 mg | ORAL_TABLET | Freq: Every day | ORAL | 0 refills | Status: DC

## 2016-02-15 NOTE — Telephone Encounter (Signed)
Patient called the office to request medication refill for furosemide (LASIX) 20 MG tablet.   Thank you.

## 2016-02-15 NOTE — Telephone Encounter (Signed)
Refilled furosemide - patient must have office visit for refills.

## 2016-02-24 ENCOUNTER — Ambulatory Visit: Payer: 59 | Attending: Family Medicine | Admitting: Family Medicine

## 2016-02-24 ENCOUNTER — Encounter: Payer: Self-pay | Admitting: Family Medicine

## 2016-02-24 VITALS — BP 160/100 | HR 85 | Temp 98.0°F | Ht 61.0 in | Wt 198.4 lb

## 2016-02-24 DIAGNOSIS — I10 Essential (primary) hypertension: Secondary | ICD-10-CM | POA: Diagnosis not present

## 2016-02-24 DIAGNOSIS — R05 Cough: Secondary | ICD-10-CM | POA: Diagnosis not present

## 2016-02-24 DIAGNOSIS — J810 Acute pulmonary edema: Secondary | ICD-10-CM | POA: Diagnosis not present

## 2016-02-24 DIAGNOSIS — Z7982 Long term (current) use of aspirin: Secondary | ICD-10-CM | POA: Diagnosis not present

## 2016-02-24 DIAGNOSIS — Z91041 Radiographic dye allergy status: Secondary | ICD-10-CM | POA: Insufficient documentation

## 2016-02-24 DIAGNOSIS — Z91013 Allergy to seafood: Secondary | ICD-10-CM | POA: Insufficient documentation

## 2016-02-24 DIAGNOSIS — F3162 Bipolar disorder, current episode mixed, moderate: Secondary | ICD-10-CM | POA: Insufficient documentation

## 2016-02-24 DIAGNOSIS — M1990 Unspecified osteoarthritis, unspecified site: Secondary | ICD-10-CM | POA: Diagnosis not present

## 2016-02-24 DIAGNOSIS — Z881 Allergy status to other antibiotic agents status: Secondary | ICD-10-CM | POA: Insufficient documentation

## 2016-02-24 DIAGNOSIS — R059 Cough, unspecified: Secondary | ICD-10-CM

## 2016-02-24 DIAGNOSIS — Z79899 Other long term (current) drug therapy: Secondary | ICD-10-CM | POA: Insufficient documentation

## 2016-02-24 DIAGNOSIS — K219 Gastro-esophageal reflux disease without esophagitis: Secondary | ICD-10-CM | POA: Insufficient documentation

## 2016-02-24 MED ORDER — LOSARTAN POTASSIUM-HCTZ 100-25 MG PO TABS: 1.0000 | ORAL_TABLET | Freq: Every day | ORAL | 1 refills | Status: DC

## 2016-02-24 MED ORDER — FUROSEMIDE 20 MG PO TABS: 20.0000 mg | ORAL_TABLET | Freq: Every day | ORAL | 1 refills | Status: DC

## 2016-02-24 MED ORDER — CARVEDILOL 25 MG PO TABS: 25.0000 mg | ORAL_TABLET | Freq: Two times a day (BID) | ORAL | 1 refills | Status: DC

## 2016-02-24 MED ORDER — ALPRAZOLAM 0.25 MG PO TABS: 0.2500 mg | ORAL_TABLET | Freq: Three times a day (TID) | ORAL | 0 refills | Status: DC

## 2016-02-24 MED ORDER — CETIRIZINE HCL 10 MG PO TABS: 10.0000 mg | ORAL_TABLET | Freq: Every day | ORAL | 3 refills | Status: DC

## 2016-02-24 MED ORDER — POTASSIUM CHLORIDE CRYS ER 20 MEQ PO TBCR: 20.0000 meq | EXTENDED_RELEASE_TABLET | Freq: Every day | ORAL | 1 refills | Status: DC

## 2016-02-24 MED ORDER — ESCITALOPRAM OXALATE 20 MG PO TABS: 30.0000 mg | ORAL_TABLET | Freq: Every day | ORAL | 1 refills | Status: DC

## 2016-02-24 NOTE — Progress Notes (Signed)
Subjective:  Patient ID: Brandy [name], female    DOB: 1966/03/06  Age: 50 y.o. MRN: [id number]  CC: Hypertension and Diabetes   HPI Brandy [name] is a 50 year old female with a history of hypertension, bipolar disorder, vertigo, GERD, history of pulmonary edema who comes into the clinic for a follow-up visit.  She has had panic attacks, worsening depression and admits to not being compliant with her psych meds or psych appointment. She was last seen by behavioral health in 06/2015 and informs me that her medications did help for some time but then her psychiatrist left the practice; I had placed another Psych referral for her again and at her last visit, we had called behavioral health clinic and was informed that the patient missed several appointments there including the last one she was scheduled for on 01/11/16 The patient states her depression makes it difficult for her to make appointments as she only wants to stay home and this is taking a toll on her marriage as she has anhedonia. Denies suicidal ideation She has also had to be sent home from work either due to uncontrolled blood pressure or severe depression    GERD symptoms are controlled.  Blood pressure is still elevated and she is taking only Coreg. She had called the clinic complaining of dry cough and her lisinopril/HCTZ had been switched to losartan/HCTZ which she is yet to pick up from the pharmacy. Complains that cough feels like a tickle in her throat and she endorses postnasal drainage.  Past Medical History:  Diagnosis Date  . Anxiety   . Depression   . Gallstones   . Hypertension   . Osteoarthritis     Past Surgical History:  Procedure Laterality Date  . GALLBLADDER SURGERY      Allergies  Allergen Reactions  . Azithromycin Other (See Comments)    "Stomach cramps"  . Contrast Media [Iodinated Diagnostic Agents] Hives  . Shellfish Allergy Swelling and Other (See Comments)    "eyes went  blind"      Outpatient Medications Prior to Visit  Medication Sig Dispense Refill  . aspirin EC 81 MG EC tablet Take 1 tablet (81 mg total) by mouth daily.    . calcium carbonate (TUMS - DOSED IN MG ELEMENTAL CALCIUM) 500 MG chewable tablet Chew 5 tablets by mouth 2 (two) times daily as needed for indigestion or heartburn.    Marland Kitchen guaiFENesin (MUCINEX) 600 MG 12 hr tablet Take 600 mg by mouth 2 (two) times daily as needed (for cold symptoms).    Marland Kitchen ibuprofen (ADVIL,MOTRIN) 200 MG tablet Take 200 mg by mouth every 6 (six) hours as needed for mild pain.    . meclizine (ANTIVERT) 25 MG tablet Take 1 tablet (25 mg total) by mouth 3 (three) times daily as needed for dizziness. 30 tablet 3  . pantoprazole (PROTONIX) 40 MG tablet Take 1 tablet (40 mg total) by mouth daily. 30 tablet 3  . ALPRAZolam (XANAX) 0.25 MG tablet Take 1 tablet (0.25 mg total) by mouth 3 (three) times daily. 90 tablet 0  . carvedilol (COREG) 25 MG tablet Take 1 tablet (25 mg total) by mouth 2 (two) times daily with a meal. 60 tablet 3  . escitalopram (LEXAPRO) 20 MG tablet Take 1.5 tablets (30 mg total) by mouth daily. 45 tablet 1  . furosemide (LASIX) 20 MG tablet Take 1 tablet (20 mg total) by mouth daily. 30 tablet 0  . potassium chloride SA (K-DUR,KLOR-CON) 20 MEQ tablet Take  1 tablet (20 mEq total) by mouth daily. 30 tablet 0  . Multiple Vitamins-Minerals (MULTI ADULT GUMMIES) CHEW Chew 2 tablets by mouth 2 (two) times daily.    . QUEtiapine (SEROQUEL) 50 MG tablet Take 1 tablet (50 mg total) by mouth at bedtime. (Patient not taking: Reported on 02/24/2016) 30 tablet 0  . losartan-hydrochlorothiazide (HYZAAR) 50-12.5 MG tablet Take 1 tablet by mouth daily. (Patient not taking: Reported on 02/24/2016) 30 tablet 3   No facility-administered medications prior to visit.     ROS Review of Systems  Constitutional: Negative for activity change, appetite change and fatigue.  HENT: Negative for congestion, sinus pressure and sore  throat.   Eyes: Negative for visual disturbance.  Respiratory: Positive for cough. Negative for chest tightness, shortness of breath and wheezing.   Cardiovascular: Negative for chest pain and palpitations.  Gastrointestinal: Negative for abdominal distention, abdominal pain and constipation.  Endocrine: Negative for polydipsia.  Genitourinary: Negative for dysuria and frequency.  Musculoskeletal: Negative for arthralgias and back pain.  Skin: Negative for rash.  Neurological: Negative for tremors, light-headedness and numbness.  Hematological: Does not bruise/bleed easily.  Psychiatric/Behavioral: Positive for dysphoric mood. Negative for agitation and behavioral problems.    Objective:  BP (!) 160/100 (BP Location: Right Arm, Cuff Size: Large)   Pulse 85   Temp 98 F (36.7 C) (Oral)   Ht 5\' 1"  (1.549 m)   Wt 198 lb 6.4 oz (90 kg)   SpO2 99%   BMI 37.49 kg/m   BP/Weight 02/24/2016 11/16/2015 [id number]  Systolic BP 0000000 0000000 A999333  Diastolic BP 123XX123 92 90  Wt. (Lbs) 198.4 200.8 188.4  BMI 37.49 37.94 34.45  Some encounter information is confidential and restricted. Go to Review Flowsheets activity to see all data.      Physical Exam  Constitutional: She is oriented to person, place, and time. She appears well-developed and well-nourished.  Neck: No JVD present.  Cardiovascular: Normal rate, normal heart sounds and intact distal pulses.   No murmur heard. Pulmonary/Chest: Effort normal and breath sounds normal. She has no wheezes. She has no rales. She exhibits no tenderness.  Abdominal: Soft. Bowel sounds are normal. She exhibits no distension and no mass. There is no tenderness.  Musculoskeletal: Normal range of motion.  Neurological: She is alert and oriented to person, place, and time.  Skin: Skin is warm and dry.  Psychiatric: She exhibits a depressed mood.     Assessment & Plan:   1. Bipolar disorder, current episode mixed, moderate ([place]) Patient has no showed  several appointments with behavioral health, the last of which was 01/11/16 after the office manager here had called over to behavioral health for this appointment. I have discussed with the patient that she will have to be more involved in her care and be more compliant with her appointments. I have had a discussion with her husband and the patient as well and they will be going into behavioral health next week to seek an appointment I have offered referring her to a private psychiatrist however she complains the cost will be greater with an out of network provider. - ALPRAZolam (XANAX) 0.25 MG tablet; Take 1 tablet (0.25 mg total) by mouth 3 (three) times daily.  Dispense: 90 tablet; Refill: 0 - escitalopram (LEXAPRO) 20 MG tablet; Take 1.5 tablets (30 mg total) by mouth daily.  Dispense: 45 tablet; Refill: 1  2. Acute pulmonary edema (HCC) Stable No evidence of fluid overload - furosemide (LASIX) 20 MG tablet;  Take 1 tablet (20 mg total) by mouth daily.  Dispense: 90 tablet; Refill: 1  3. Essential hypertension Uncontrolled due to not taking losartan/HCTZ which I have sent to the pharmacy - carvedilol (COREG) 25 MG tablet; Take 1 tablet (25 mg total) by mouth 2 (two) times daily with a meal.  Dispense: 180 tablet; Refill: 1 - losartan-hydrochlorothiazide (HYZAAR) 100-25 MG tablet; Take 1 tablet by mouth daily.  Dispense: 90 tablet; Refill: 1  4. Cough Likely from postnasal drip Placed on antihistamine - cetirizine (ZYRTEC) 10 MG tablet; Take 1 tablet (10 mg total) by mouth daily.  Dispense: 30 tablet; Refill: 3   She informs me she would need FMLA paperwork to cover her for periods of uncontrolled blood pressures and severe depression when she is sent home from work but does not have the paperwork with her. She will need to drop this off.  Meds ordered this encounter  Medications  . ALPRAZolam (XANAX) 0.25 MG tablet    Sig: Take 1 tablet (0.25 mg total) by mouth 3 (three) times daily.     Dispense:  90 tablet    Refill:  0  . carvedilol (COREG) 25 MG tablet    Sig: Take 1 tablet (25 mg total) by mouth 2 (two) times daily with a meal.    Dispense:  180 tablet    Refill:  1    Must have office visit for refills  . escitalopram (LEXAPRO) 20 MG tablet    Sig: Take 1.5 tablets (30 mg total) by mouth daily.    Dispense:  45 tablet    Refill:  1  . furosemide (LASIX) 20 MG tablet    Sig: Take 1 tablet (20 mg total) by mouth daily.    Dispense:  90 tablet    Refill:  1    Must have office visit for refills  . potassium chloride SA (K-DUR,KLOR-CON) 20 MEQ tablet    Sig: Take 1 tablet (20 mEq total) by mouth daily.    Dispense:  90 tablet    Refill:  1  . losartan-hydrochlorothiazide (HYZAAR) 100-25 MG tablet    Sig: Take 1 tablet by mouth daily.    Dispense:  90 tablet    Refill:  1    Discontinue previous dose  . cetirizine (ZYRTEC) 10 MG tablet    Sig: Take 1 tablet (10 mg total) by mouth daily.    Dispense:  30 tablet    Refill:  3    Follow-up: Return in about 1 month (around 03/25/2016) for Follow-up on hypertension.   Arnoldo Morale MD

## 2016-02-24 NOTE — Progress Notes (Signed)
She states that she was on lisinopril and stopped because of coughing  Manual- 160/100

## 2016-03-21 ENCOUNTER — Telehealth: Payer: Self-pay

## 2016-03-21 NOTE — Telephone Encounter (Signed)
Writer called patient to inform her that her FMLA paperwork is ready for pick up at the front desk.  Copies made and placed in scanning.

## 2016-04-05 ENCOUNTER — Telehealth: Payer: Self-pay | Admitting: Family Medicine

## 2016-04-05 MED ORDER — BENZONATATE 100 MG PO CAPS: 100.0000 mg | ORAL_CAPSULE | Freq: Two times a day (BID) | ORAL | 0 refills | Status: DC | PRN

## 2016-04-05 NOTE — Telephone Encounter (Signed)
Patient is asking to be prescribed a medication for her cough. Patient would like to pick up today  Please follow up.

## 2016-04-05 NOTE — Telephone Encounter (Signed)
Sent to pharmacy 

## 2016-04-05 NOTE — Telephone Encounter (Signed)
Writer called patient and let her know requested prescription waiting at the pharmacy for pick up.

## 2016-04-08 ENCOUNTER — Telehealth: Payer: Self-pay | Admitting: Family Medicine

## 2016-04-08 NOTE — Telephone Encounter (Signed)
Patient needs FMLA paperwork faxed to employer.  Attention: Jerline Pain Fax: [phone]

## 2016-04-09 ENCOUNTER — Encounter (HOSPITAL_COMMUNITY): Payer: Self-pay | Admitting: Emergency Medicine

## 2016-04-09 ENCOUNTER — Ambulatory Visit (HOSPITAL_COMMUNITY)
Admission: EM | Admit: 2016-04-09 | Discharge: 2016-04-09 | Disposition: A | Discharge status: Home / Self Care | Payer: 59 | Attending: Family Medicine | Admitting: Family Medicine

## 2016-04-09 DIAGNOSIS — R05 Cough: Secondary | ICD-10-CM | POA: Diagnosis not present

## 2016-04-09 DIAGNOSIS — J209 Acute bronchitis, unspecified: Secondary | ICD-10-CM | POA: Diagnosis not present

## 2016-04-09 DIAGNOSIS — R059 Cough, unspecified: Secondary | ICD-10-CM

## 2016-04-09 MED ORDER — POLYMYXIN B-TRIMETHOPRIM 10000-0.1 UNIT/ML-% OP SOLN: 2.0000 [drp] | OPHTHALMIC | 0 refills | Status: DC

## 2016-04-09 MED ORDER — AMOXICILLIN 875 MG PO TABS: 875.0000 mg | ORAL_TABLET | Freq: Two times a day (BID) | ORAL | 0 refills | Status: DC

## 2016-04-09 MED ORDER — IPRATROPIUM BROMIDE 0.06 % NA SOLN: 2.0000 | Freq: Four times a day (QID) | NASAL | 12 refills | Status: DC

## 2016-04-09 MED ORDER — BENZONATATE 100 MG PO CAPS: 200.0000 mg | ORAL_CAPSULE | Freq: Three times a day (TID) | ORAL | 0 refills | Status: DC | PRN

## 2016-04-09 NOTE — ED Triage Notes (Signed)
The patient presented to the [facility] with a complaint of a cough x 2 weeks. 

## 2016-04-09 NOTE — ED Provider Notes (Signed)
CSN: [id number]     Arrival date & time 04/09/16  1211 History   First MD Initiated Contact with Patient 04/09/16 1305     Chief Complaint  Patient presents with  . Cough   (Consider location/radiation/quality/duration/timing/severity/associated sxs/prior Treatment) Patient c/o cough and uri sx's for 2 weeks.   The history is provided by the patient.  Cough  Cough characteristics:  Productive Sputum characteristics:  White Severity:  Moderate Onset quality:  Sudden Duration:  2 weeks Timing:  Constant Progression:  Waxing and waning Smoker: no   Context: upper respiratory infection and weather changes   Relieved by:  Nothing Worsened by:  Nothing Associated symptoms: rhinorrhea and wheezing     Past Medical History:  Diagnosis Date  . Anxiety   . Depression   . Gallstones   . Hypertension   . Osteoarthritis    Past Surgical History:  Procedure Laterality Date  . GALLBLADDER SURGERY     Family History  Problem Relation Age of Onset  . Alcohol abuse Mother   . Schizophrenia Maternal Grandmother   . Coronary artery disease Paternal Grandmother     young age  . Diabetes Paternal Grandmother   . Hypertension Father   . Diabetes Sister    Social History  Substance Use Topics  . Smoking status: Former Smoker    Packs/day: 1.00    Years: 1.50    Types: Cigarettes    Quit date: 01/03/2015  . Smokeless tobacco: Never Used  . Alcohol use 1.8 - 3.0 oz/week    1 - 2 Glasses of wine, 2 - 3 Cans of beer per week     Comment: Weekends.    OB History    No data available     Review of Systems  Constitutional: Negative.   HENT: Positive for rhinorrhea.   Eyes: Negative.   Respiratory: Positive for cough and wheezing.   Cardiovascular: Negative.   Gastrointestinal: Negative.   Endocrine: Negative.   Genitourinary: Negative.   Musculoskeletal: Negative.   Allergic/Immunologic: Negative.   Hematological: Negative.   Psychiatric/Behavioral: Negative.      Allergies  Azithromycin; Contrast media [iodinated diagnostic agents]; and Shellfish allergy  Home Medications   Prior to Admission medications   Medication Sig Start Date End Date Taking? Authorizing Provider  aspirin EC 81 MG EC tablet Take 1 tablet (81 mg total) by mouth daily. 02/05/15  Yes Domenic Polite, MD  benzonatate (TESSALON) 100 MG capsule Take 1 capsule (100 mg total) by mouth 2 (two) times daily as needed for cough. 04/05/16  Yes Arnoldo Morale, MD  carvedilol (COREG) 25 MG tablet Take 1 tablet (25 mg total) by mouth 2 (two) times daily with a meal. 02/24/16  Yes Arnoldo Morale, MD  furosemide (LASIX) 20 MG tablet Take 1 tablet (20 mg total) by mouth daily. 02/24/16  Yes Arnoldo Morale, MD  losartan-hydrochlorothiazide (HYZAAR) 100-25 MG tablet Take 1 tablet by mouth daily. 02/24/16  Yes Arnoldo Morale, MD  pantoprazole (PROTONIX) 40 MG tablet Take 1 tablet (40 mg total) by mouth daily. 11/16/15  Yes Arnoldo Morale, MD  potassium chloride SA (K-DUR,KLOR-CON) 20 MEQ tablet Take 1 tablet (20 mEq total) by mouth daily. 02/24/16  Yes Arnoldo Morale, MD  amoxicillin (AMOXIL) 875 MG tablet Take 1 tablet (875 mg total) by mouth 2 (two) times daily. 04/09/16   Lysbeth Penner, FNP  benzonatate (TESSALON) 100 MG capsule Take 2 capsules (200 mg total) by mouth 3 (three) times daily as needed for cough. 04/09/16  Lysbeth Penner, FNP  ipratropium (ATROVENT) 0.06 % nasal spray Place 2 sprays into both nostrils 4 (four) times daily. 04/09/16   Lysbeth Penner, FNP  trimethoprim-polymyxin b (POLYTRIM) ophthalmic solution Place 2 drops into the left eye every 4 (four) hours. 04/09/16   Lysbeth Penner, FNP   Meds Ordered and Administered this Visit  Medications - No data to display  BP 128/76 (BP Location: Right Arm)   Pulse 82   Temp 98.2 F (36.8 C) (Oral)   Resp 18   SpO2 99%  No data found.   Physical Exam  Constitutional: She appears well-developed and well-nourished.  HENT:  Head:  Normocephalic and atraumatic.  Right Ear: External ear normal.  Left Ear: External ear normal.  Mouth/Throat: Oropharynx is clear and moist.  Eyes: Conjunctivae and EOM are normal. Pupils are equal, round, and reactive to light.  Neck: Normal range of motion. Neck supple.  Cardiovascular: Normal rate, regular rhythm and normal heart sounds.   Pulmonary/Chest: Effort normal.  Nursing note and vitals reviewed.   Urgent Care Course   Clinical Course     Procedures (including critical care time)  Labs Review Labs Reviewed - No data to display  Imaging Review No results found.   Visual Acuity Review  Right Eye Distance:   Left Eye Distance:   Bilateral Distance:    Right Eye Near:   Left Eye Near:    Bilateral Near:         MDM   1. Acute bronchitis, unspecified organism   2. Cough    Atrovent nasal spray Tessalon perles amoxicillin    Lysbeth Penner, FNP 04/09/16 1350

## 2016-04-19 NOTE — Telephone Encounter (Signed)
This was done a while back

## 2016-04-22 NOTE — Telephone Encounter (Signed)
Refaxed FMLA paperwork to [facility].

## 2016-06-13 ENCOUNTER — Ambulatory Visit (HOSPITAL_COMMUNITY): Payer: Self-pay | Admitting: Psychiatry

## 2016-06-17 ENCOUNTER — Encounter (HOSPITAL_COMMUNITY): Payer: Self-pay | Admitting: Psychiatry

## 2016-06-17 ENCOUNTER — Ambulatory Visit (INDEPENDENT_AMBULATORY_CARE_PROVIDER_SITE_OTHER): Payer: 59 | Admitting: Psychiatry

## 2016-06-17 VITALS — BP 132/80 | HR 84 | Ht 61.0 in | Wt 191.6 lb

## 2016-06-17 DIAGNOSIS — Z818 Family history of other mental and behavioral disorders: Secondary | ICD-10-CM | POA: Diagnosis not present

## 2016-06-17 DIAGNOSIS — F1290 Cannabis use, unspecified, uncomplicated: Secondary | ICD-10-CM

## 2016-06-17 DIAGNOSIS — F3130 Bipolar disorder, current episode depressed, mild or moderate severity, unspecified: Secondary | ICD-10-CM | POA: Diagnosis not present

## 2016-06-17 DIAGNOSIS — Z87891 Personal history of nicotine dependence: Secondary | ICD-10-CM

## 2016-06-17 DIAGNOSIS — F319 Bipolar disorder, unspecified: Secondary | ICD-10-CM

## 2016-06-17 DIAGNOSIS — Z79899 Other long term (current) drug therapy: Secondary | ICD-10-CM

## 2016-06-17 MED ORDER — CLONAZEPAM 0.5 MG PO TABS: 0.5000 mg | ORAL_TABLET | Freq: Every day | ORAL | 1 refills | Status: DC

## 2016-06-17 MED ORDER — BREXPIPRAZOLE 2 MG PO TABS: 2.0000 mg | ORAL_TABLET | Freq: Every day | ORAL | 1 refills | Status: DC

## 2016-06-17 NOTE — Patient Instructions (Signed)
Start Rexulti 0.5 mg at night for 1 week  Then increase to 1 mg at night for 1 week  Increase to 2 mg at night after that  Come back to see me in 2-3 weeks  Lets get you set up for individual therapy  STOP Lexapro  Take Clonazepam every night as prescribed

## 2016-06-17 NOTE — Progress Notes (Signed)
Psychiatric Initial Adult Assessment   Patient Identification: Brandy [name] MRN:  [id number] Date of Evaluation:  06/17/2016 Referral Source: self Chief Complaint:  severe depression, anhedonia, worthlessness Visit Diagnosis: No diagnosis found.  History of Present Illness:  Brandy [name] presents today with her husband for psychiatric follow-up visit. She is previously been seen in this clinic for intensive outpatient groups for depression, and previously followed by psychiatry for medication management.  I reviewed her chart, and it appears that she has a diagnosis of bipolar disorder, depression and a significant history of trauma.  I reviewed with the patient, and her husband, to learn about the cyclical nature of her mood episodes, and her severe melancholic depressive symptoms. She reports that she is currently only taking Lexapro 20 mg daily. She reports that she has continued to struggle with significant depressive symptoms over the past 9 months to 1 year. She reports significant melancholy, low energy, sense of worthlessness, sense of futility, frequent tearfulness, irritability and social withdrawal, and perseveration on the negative experiences of her life. She also struggles with episodes of panic throughout the day. Due to her declining mood and episodes of panic, she has went from full time at her job, now to part-time (20 hours per week). She denies any suicidal thoughts, but feels that she is a burden to her family, and at times feels that it might be easier if she just passed away. Her husband is not concerned she would harm herself. He is concerned about how she lays in bed all day, cries often, and nothing seems to be able to make her feel better.   At baseline, the patient and her husband share that she is generally a very happy person, singing, cooking, cleaning, and an active participant in the household and social activities been like this for the past 2-3 years, and  her depression has been very severe for the past 1 year.  She reports that she wants to feel better, and she feels guilty for what she is going through. I spent time validating the patient's grief, and educating her and her husband on bipolar depression, and the pathophysiology of this illness. I spent time aligning with the patient's on her goals of improved mood, improved energy, and discussed that we can make gradual changes to hopefully help her feel better.  I educated the patient about the use of Brexpiprazole and clonazepam nightly, and instructed her to discontinue Lexapro. I reviewed the risks and benefits of these therapies with the patient and her husband. They are agreeable to these changes, and to follow up in 2-3 weeks, and to also make arrangements for the patient to have an individual therapist in clinic.  She is not engaging in any substance use at this time.   Associated Signs/Symptoms: Depression Symptoms:  depressed mood, anhedonia, hypersomnia, psychomotor retardation, fatigue, feelings of worthlessness/guilt, difficulty concentrating, hopelessness, impaired memory, recurrent thoughts of death, anxiety, panic attacks, loss of energy/fatigue, disturbed sleep, (Hypo) Manic Symptoms:  Distractibility, Flight of Ideas, Irritable Mood, Labiality of Mood, Anxiety Symptoms:  none Psychotic Symptoms:  none PTSD Symptoms: Negative  Past Psychiatric History: Psychiatric history of intensive outpatient treatment, and prior medication management at this clinic and she has a history of bipolar depression, and poor engagement with treatment   Previous Psychotropic Medications: Yes   Substance Abuse History in the last 12 months:  No.  Consequences of Substance Abuse: Negative  Past Medical History:  Past Medical History:  Diagnosis Date  . Anxiety   .  Depression   . Diverticulitis   . Edema   . Gallstones   . Hypertension   . Osteoarthritis     Past Surgical  History:  Procedure Laterality Date  . GALLBLADDER SURGERY      Family Psychiatric History: Family history of bipolar disorder and schizophrenia   Family History:  Family History  Problem Relation Age of Onset  . Alcohol abuse Mother   . Schizophrenia Maternal Grandmother   . Coronary artery disease Paternal Grandmother     young age  . Diabetes Paternal Grandmother   . Hypertension Father   . Diabetes Sister     Social History:   Social History   Social History  . Marital status: Married    Spouse name: N/A  . Number of children: 3  . Years of education: N/A   Occupational History  . Nurse [employer]   Social History Main Topics  . Smoking status: Former Smoker    Packs/day: 1.00    Years: 1.50    Types: Cigarettes    Quit date: 01/03/2015  . Smokeless tobacco: Never Used  . Alcohol use 1.8 - 3.0 oz/week    1 - 2 Glasses of wine, 2 - 3 Cans of beer per week     Comment: Weekends.   . Drug use: Yes    Types: Marijuana     Comment: everyday  . Sexual activity: Yes   Other Topics Concern  . None   Social History Narrative  . None    Additional Social History: Currently lives with her husband of 70 years, and 41 year old daughter   Allergies:   Allergies  Allergen Reactions  . Azithromycin Other (See Comments)    "Stomach cramps"  . Contrast Media [Iodinated Diagnostic Agents] Hives  . 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"    Metabolic Disorder Labs: Lab Results  Component Value Date   HGBA1C 5.70 04/29/2015   MPG 117 02/04/2015   No results found for: PROLACTIN Lab Results  Component Value Date   CHOL 176 02/03/2015   TRIG 121 02/03/2015   HDL 56 02/03/2015   CHOLHDL 3.1 02/03/2015   VLDL 24 02/03/2015   LDLCALC 96 02/03/2015     Current Medications: Current Outpatient Prescriptions  Medication Sig Dispense Refill  . aspirin EC 81 MG EC tablet Take 1 tablet (81 mg total) by mouth daily.    .  carvedilol (COREG) 25 MG tablet Take 1 tablet (25 mg total) by mouth 2 (two) times daily with a meal. 180 tablet 1  . escitalopram (LEXAPRO) 20 MG tablet Take 30 mg by mouth daily.    . furosemide (LASIX) 20 MG tablet Take 1 tablet (20 mg total) by mouth daily. 90 tablet 1  . losartan-hydrochlorothiazide (HYZAAR) 100-25 MG tablet Take 1 tablet by mouth daily. 90 tablet 1  . pantoprazole (PROTONIX) 40 MG tablet Take 1 tablet (40 mg total) by mouth daily. 30 tablet 3  . potassium chloride SA (K-DUR,KLOR-CON) 20 MEQ tablet Take 1 tablet (20 mEq total) by mouth daily. 90 tablet 1   No current facility-administered medications for this visit.     Neurologic: Headache: Negative Seizure: Negative Paresthesias:Negative  Musculoskeletal: Strength & Muscle Tone: within normal limits Gait & Station: normal Patient leans: N/A  Psychiatric Specialty Exam: Review of Systems  Constitutional: Negative.   HENT: Negative.   Respiratory: Negative.   Cardiovascular: Negative.   Gastrointestinal: Negative.   Neurological: Negative.  Psychiatric/Behavioral: Positive for depression and memory loss. The patient is nervous/anxious.     Blood pressure 132/80, pulse 84, height 5\' 1"  (1.549 m), weight 86.9 kg (191 lb 9.6 oz).Body mass index is 36.2 kg/m.  General Appearance: Casual and Fairly Groomed  Eye Contact:  Fair  Speech:  Pressured  Volume:  Increased  Mood:  Irritable and Worthless  Affect:  Depressed and Tearful  Thought Process:  Coherent and Goal Directed  Orientation:  Full (Time, Place, and Person)  Thought Content:  Logical  Suicidal Thoughts:  No  Homicidal Thoughts:  No  Memory:  Immediate;   Fair  Judgement:  Fair  Insight:  Fair and Shallow  Psychomotor Activity:  Normal  Concentration:  Concentration: Fair and Attention Span: Fair  Recall:  NA  Fund of Knowledge:Good  Language: Good  Akathisia:  Negative  Handed:  Right  AIMS (if indicated):  na  Assets:  Communication  Skills Desire for Improvement Financial Resources/Insurance Housing Intimacy Social Support Talents/Skills Transportation  ADL's:  Intact  Cognition: WNL  Sleep:  12-15 hours, naps during the day    Treatment Plan Summary: RAMINA HULET is a 51 year old female with a psychiatric history of bipolar disorder, currently presenting with significant depressive symptoms. She is previously been tried on Seroquel, Abilify, risperidone, with equivocal or poor results in terms of improving her depressive symptoms. She is currently unfortunately only on Lexapro as monotherapy for her bipolar depression. I instructed the patient to immediately discontinue this medication, and we will initiate clonazepam and atypical antipsychotic for bipolar depression. I instructed the patient on the risks and benefits of using Rexulti for depressive symptoms, and she agrees to follow-up in 2-3 weeks. She has an excellent social support from her husband, continues to engage in part-time work, but this is a reduction from her prior full-time work. She does not have any acute safety issues, but presents with significant grief, hopelessness, and sense of burdensomeness to her family. We will continue to titrate to effect, and consider alternative agents if her mood does not respond to these interventions.   1. Bipolar depression ([place])    Discontinue Lexapro and any other agents, patient reports that she uses Xanax every now and then, but I instructed her to stop that Initiate Rexulti 0.5 mg for 1 week, then 1 mg for 1 week, then 2 mg thereafter Initiate clonazepam 0.5 mg nightly Follow-up in 2-3 weeks Patient to schedule individual therapy follow-up in this clinic Discussed activating behaviors, including the patient returning to her regular church services, and the patient agrees to do this starting this Sunday  Aundra Dubin, MD 3/16/201810:27 AM

## 2016-07-16 IMAGING — CT CT CHEST W/O CM
2 of 4 series · 15 of 36 positions shown, 18 images · non-contrast
Comparison: Chest radiograph 02/01/1959.

CLINICAL DATA: Increasing shortness of breath for 2 weeks when
lying down. Abnormal chest radiograph.

EXAM:
CT CHEST WITHOUT CONTRAST
TECHNIQUE: Multidetector CT imaging of the chest was performed following the
standard protocol without IV contrast.

[Series 2: chest w/o st · axial · non-contrast · 0.66mm/px · z∈[-260,-30]mm · 12 of 56 slices shown, 15 images]
[im 5/56  mediastinal]
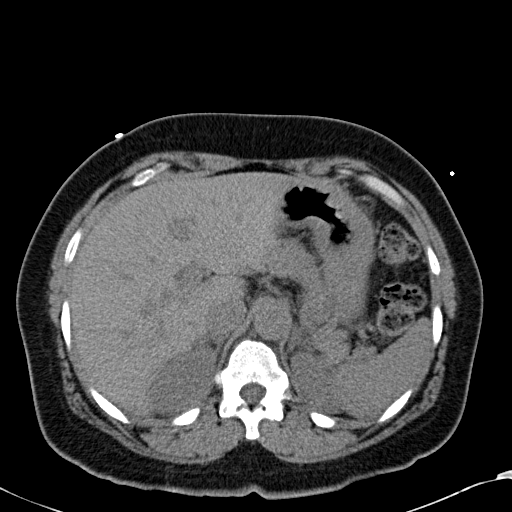
[im 5/56  lung]
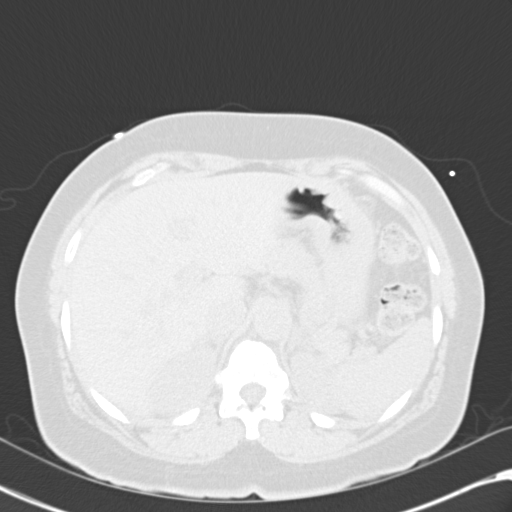
[im 9/56  lung]
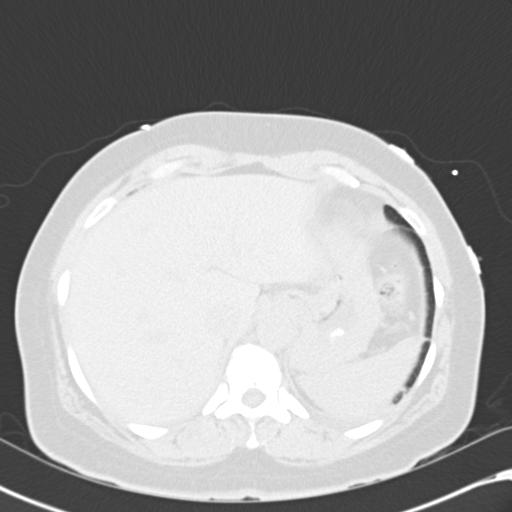
[im 13/56  lung]
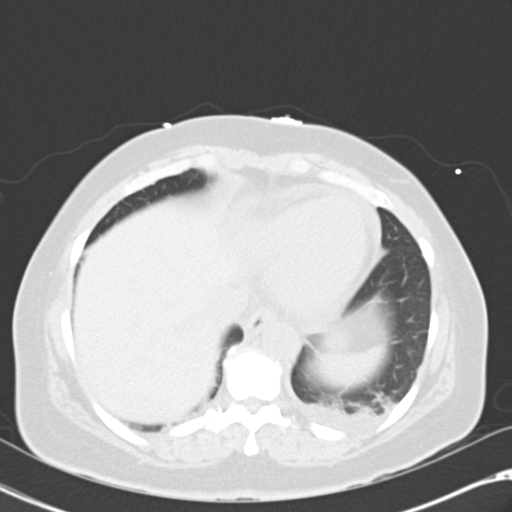
[im 17/56  lung]
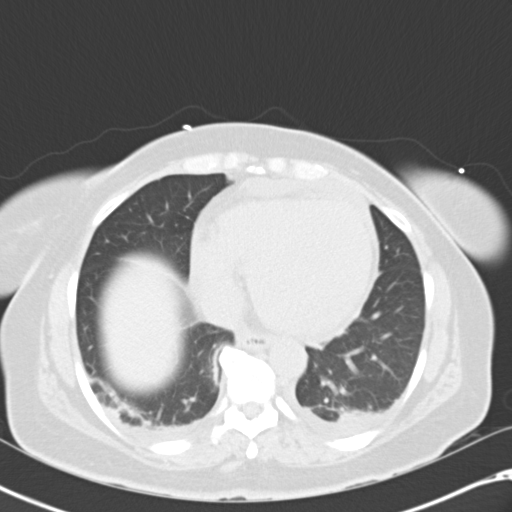
[im 22/56  mediastinal]
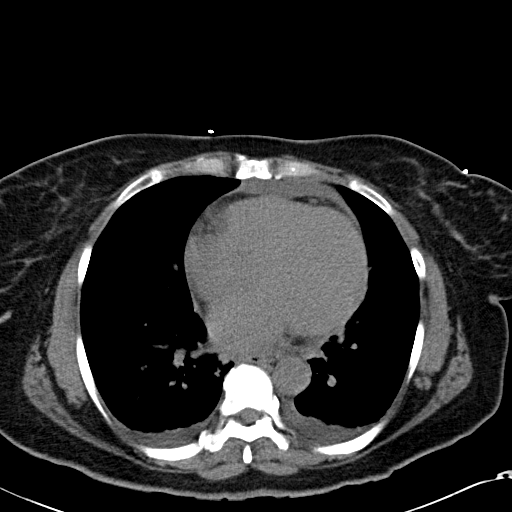
[im 22/56  lung]
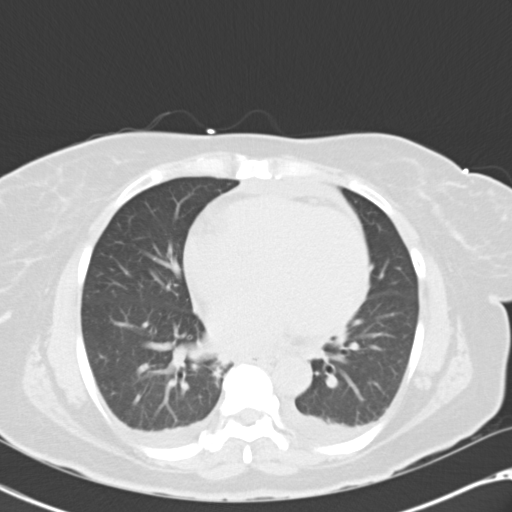
[im 26/56  lung]
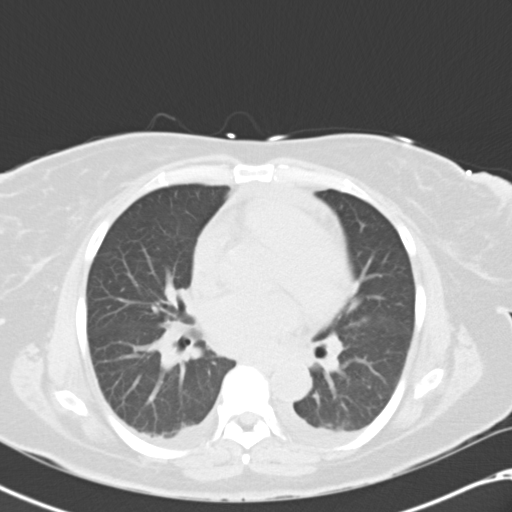
[im 30/56  lung]
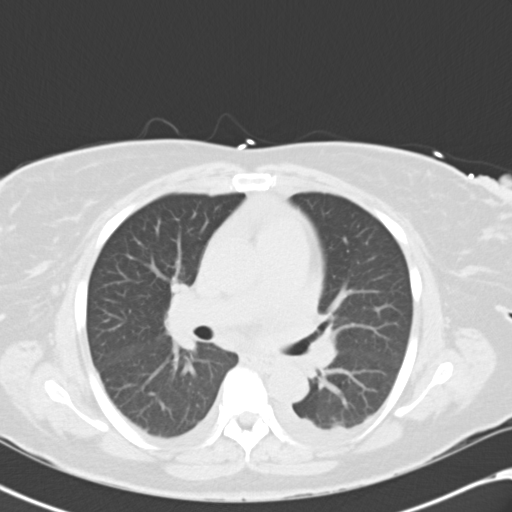
[im 34/56  lung]
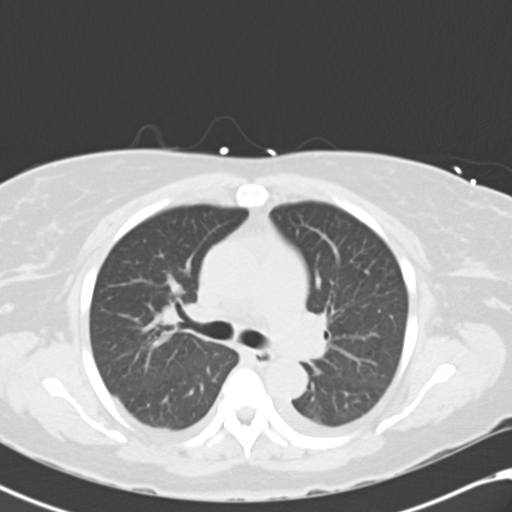
[im 39/56  mediastinal]
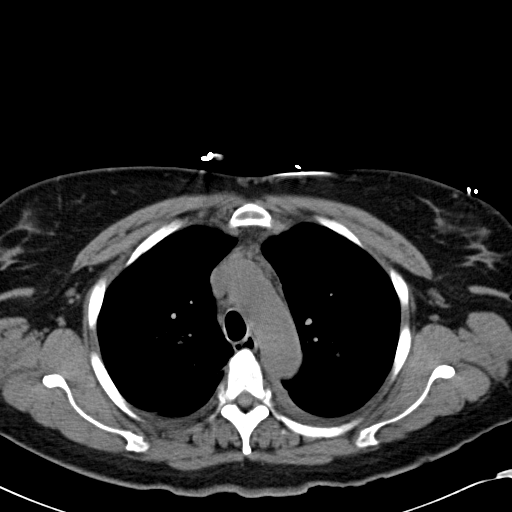
[im 39/56  lung]
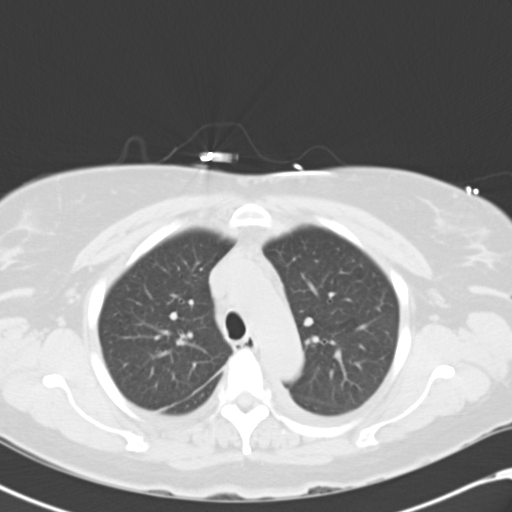
[im 43/56  lung]
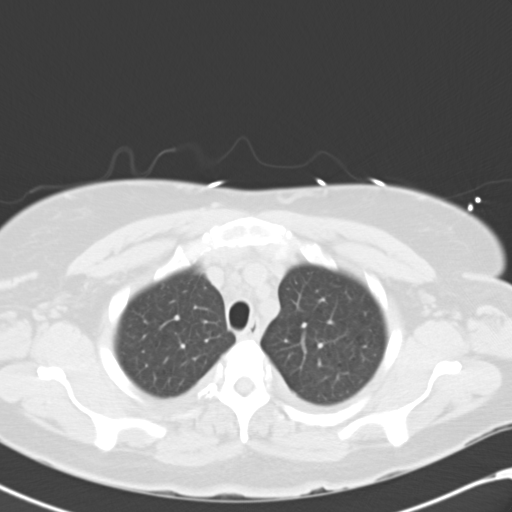
[im 47/56  lung]
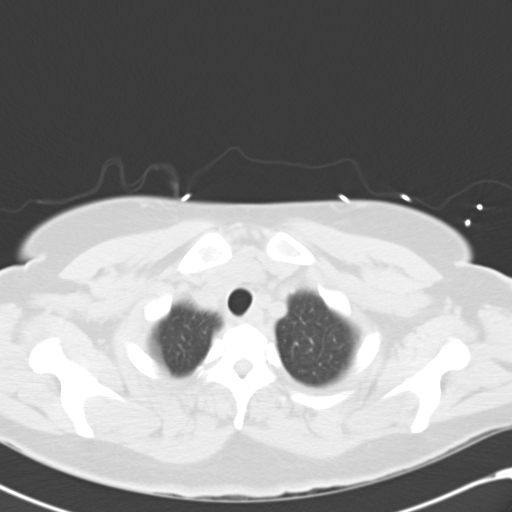
[im 51/56  lung]
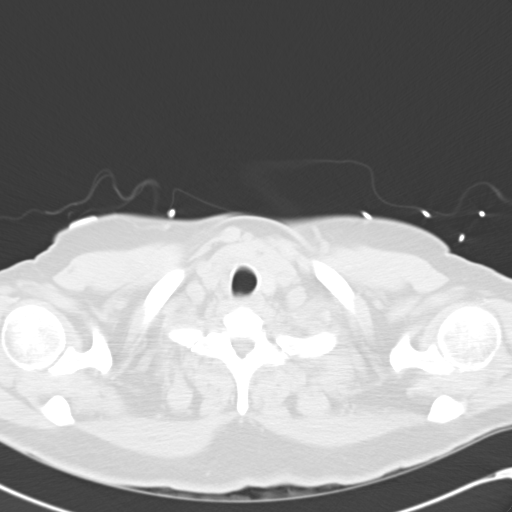

[Series 602: <mpr thick range> · coronal · 0.66mm/px · 3 of 81 slices shown]
[im 17/81  lung]
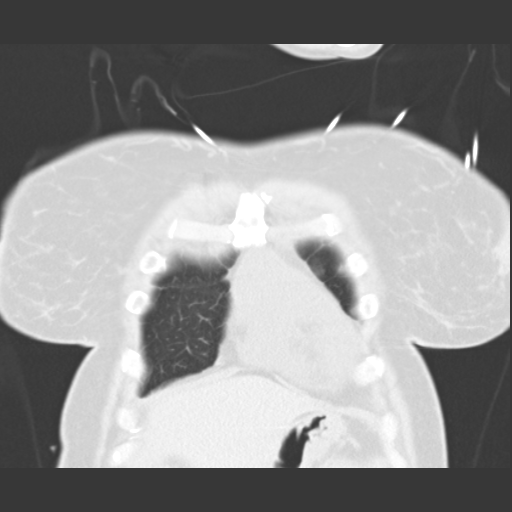
[im 33/81  lung]
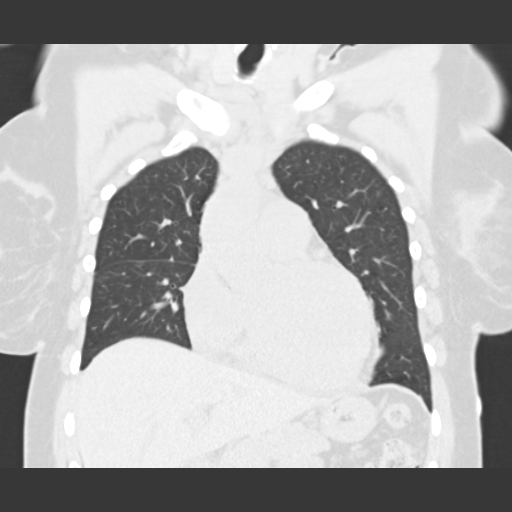
[im 49/81  lung]
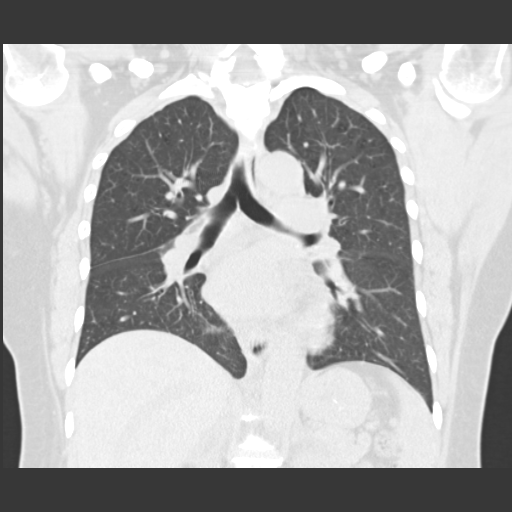

[15 of 36 positions shown; findings below may reference images not displayed]

FINDINGS: Mediastinum/Nodes: Mediastinal lymph nodes are not enlarged by CT
size criteria. Hilar regions are difficult to definitively evaluate
without IV contrast. No axillary adenopathy. Pulmonary arteries are
enlarged. Heart size normal. Small pericardial effusion.

Lungs/Pleura: Minimal centrilobular emphysema. No septal thickening
for airspace consolidation. Tiny bilateral effusions with minimal
compressive atelectasis at both lung bases. Airway is unremarkable.

Upper abdomen: Visualized portions of the liver, kidneys, spleen
pancreas, stomach and bowel are grossly unremarkable.
Cholecystectomy.

Musculoskeletal: No worrisome lytic or sclerotic lesions.
Degenerative changes are seen in the spine.
IMPRESSION: 1. Small pericardial effusion.
2. Tiny bilateral pleural effusions with minimal compressive
atelectasis in both lower lobes.
3. Enlarged pulmonary arteries, indicative of pulmonary arterial
hypertension.

## 2016-07-20 ENCOUNTER — Telehealth (HOSPITAL_COMMUNITY): Payer: Self-pay

## 2016-07-20 ENCOUNTER — Encounter (HOSPITAL_COMMUNITY): Payer: Self-pay | Admitting: Psychiatry

## 2016-07-20 ENCOUNTER — Ambulatory Visit (INDEPENDENT_AMBULATORY_CARE_PROVIDER_SITE_OTHER): Payer: 59 | Admitting: Psychiatry

## 2016-07-20 DIAGNOSIS — Z818 Family history of other mental and behavioral disorders: Secondary | ICD-10-CM | POA: Diagnosis not present

## 2016-07-20 DIAGNOSIS — F17210 Nicotine dependence, cigarettes, uncomplicated: Secondary | ICD-10-CM

## 2016-07-20 DIAGNOSIS — Z79899 Other long term (current) drug therapy: Secondary | ICD-10-CM | POA: Diagnosis not present

## 2016-07-20 DIAGNOSIS — F3130 Bipolar disorder, current episode depressed, mild or moderate severity, unspecified: Secondary | ICD-10-CM | POA: Diagnosis not present

## 2016-07-20 DIAGNOSIS — Z811 Family history of alcohol abuse and dependence: Secondary | ICD-10-CM | POA: Diagnosis not present

## 2016-07-20 DIAGNOSIS — Z7982 Long term (current) use of aspirin: Secondary | ICD-10-CM

## 2016-07-20 DIAGNOSIS — F319 Bipolar disorder, unspecified: Secondary | ICD-10-CM

## 2016-07-20 DIAGNOSIS — F1290 Cannabis use, unspecified, uncomplicated: Secondary | ICD-10-CM

## 2016-07-20 MED ORDER — CLONAZEPAM 0.5 MG PO TABS: 0.5000 mg | ORAL_TABLET | Freq: Two times a day (BID) | ORAL | 3 refills | Status: DC

## 2016-07-20 MED ORDER — CLONAZEPAM 0.5 MG PO TABS: 0.5000 mg | ORAL_TABLET | Freq: Every day | ORAL | 0 refills | Status: DC

## 2016-07-20 MED ORDER — LAMOTRIGINE 25 MG PO TABS
25.0000 mg | ORAL_TABLET | Freq: Two times a day (BID) | ORAL | 2 refills | Status: DC
Start: 2016-07-20 — End: 2016-08-31

## 2016-07-20 MED ORDER — BREXPIPRAZOLE 2 MG PO TABS: 2.0000 mg | ORAL_TABLET | Freq: Every day | ORAL | 1 refills | Status: DC

## 2016-07-20 NOTE — Progress Notes (Signed)
[facility] MD/PA/NP OP Progress Note  07/20/2016 10:46 AM Brandy [name]  MRN:  [id number]  Chief Complaint:  still anxious, irritable, weight gain, rumination  Subjective:  Domingo Pulse resents today for psychiatric follow-up. She has tolerated Rexulti 2 mg nightly without any significant side effects, except for the 8-10 pounds of weight gain she has noted in the past 4 weeks. I spent time discussing the patient's diet habits. She reports that she tends to eat one large meal daily, and does not eat any other meals like breakfast or lunch. I spoke with the patient about metabolism, frequent and smaller meals, she was agreeable to try this. She remains labile and tearful throughout our interaction, but less so than our previous meeting. She reports that her husband has given her good feedback that he feels like this medication is helping.  She continues to be very anxious, upset easily, irritable. She has been more likely to go out with her husband for dinner or for meals. She continues to work 5 days a week from 5:30 AM to 9:30 AM, but feels like she needs additional support. She was agreeable to engage in the intensive outpatient program, and we coordinated with Velva Harman in clinic.  Regarding her continued mood symptoms, we discussed increasing clonazepam to twice daily, continuing the current dose of Rex salty 2 mg, and adding Lamictal 25 mg twice daily. I cautioned her against the risk of Stevens-Johnson syndrome rash, and she was agreeable to the changes as recommended. She denies any acute suicidality. We'll follow-up with Probation officer in 4-6 weeks.  Visit Diagnosis:    ICD-9-CM ICD-10-CM   1. Bipolar depression ([place]) 296.50 F31.30 lamoTRIgine (LAMICTAL) 25 MG tablet     Brexpiprazole (REXULTI) 2 MG TABS     clonazePAM (KLONOPIN) 0.5 MG tablet     DISCONTINUED: clonazePAM (KLONOPIN) 0.5 MG tablet    Past Psychiatric History: See intake H&P for full details. Reviewed, with no updates at this  time.   Past Medical History:  Past Medical History:  Diagnosis Date  . Anxiety   . Depression   . Diverticulitis   . Edema   . Gallstones   . Hypertension   . Osteoarthritis     Past Surgical History:  Procedure Laterality Date  . GALLBLADDER SURGERY      Family Psychiatric History: See intake H&P for full details. Reviewed, with no updates at this time.   Family History:  Family History  Problem Relation Age of Onset  . Alcohol abuse Mother   . Schizophrenia Maternal Grandmother   . Coronary artery disease Paternal Grandmother     young age  . Diabetes Paternal Grandmother   . Hypertension Father   . Diabetes Sister     Social History:  Social History   Social History  . Marital status: Married    Spouse name: N/A  . Number of children: 3  . Years of education: N/A   Occupational History  . Nurse [employer]   Social History Main Topics  . Smoking status: Current Every Day Smoker    Packs/day: 1.00    Years: 1.50    Types: Cigarettes    Last attempt to quit: 01/03/2015  . Smokeless tobacco: Never Used  . Alcohol use 1.8 - 3.0 oz/week    1 - 2 Glasses of wine, 2 - 3 Cans of beer per week     Comment: Weekends.   . Drug use: Yes    Types: Marijuana  Comment: everyday  . Sexual activity: Yes    Partners: Male    Birth control/ protection: None   Other Topics Concern  . None   Social History Narrative  . None    Allergies:  Allergies  Allergen Reactions  . Azithromycin Other (See Comments)    "Stomach cramps"  . Contrast Media [Iodinated Diagnostic Agents] Hives  . 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"    Metabolic Disorder Labs: Lab Results  Component Value Date   HGBA1C 5.70 04/29/2015   MPG 117 02/04/2015   No results found for: PROLACTIN Lab Results  Component Value Date   CHOL 176 02/03/2015   TRIG 121 02/03/2015   HDL 56 02/03/2015   CHOLHDL 3.1 02/03/2015   VLDL 24 02/03/2015    LDLCALC 96 02/03/2015     Current Medications: Current Outpatient Prescriptions  Medication Sig Dispense Refill  . aspirin EC 81 MG EC tablet Take 1 tablet (81 mg total) by mouth daily.    . Brexpiprazole (REXULTI) 2 MG TABS Take 2 mg by mouth at bedtime. 90 tablet 1  . carvedilol (COREG) 25 MG tablet Take 1 tablet (25 mg total) by mouth 2 (two) times daily with a meal. 180 tablet 1  . clonazePAM (KLONOPIN) 0.5 MG tablet Take 1 tablet (0.5 mg total) by mouth 2 (two) times daily. 180 tablet 3  . furosemide (LASIX) 20 MG tablet Take 1 tablet (20 mg total) by mouth daily. 90 tablet 1  . lamoTRIgine (LAMICTAL) 25 MG tablet Take 1 tablet (25 mg total) by mouth 2 (two) times daily. 60 tablet 2  . losartan-hydrochlorothiazide (HYZAAR) 100-25 MG tablet Take 1 tablet by mouth daily. 90 tablet 1  . pantoprazole (PROTONIX) 40 MG tablet Take 1 tablet (40 mg total) by mouth daily. 30 tablet 3  . potassium chloride SA (K-DUR,KLOR-CON) 20 MEQ tablet Take 1 tablet (20 mEq total) by mouth daily. 90 tablet 1   No current facility-administered medications for this visit.     Neurologic: Headache: Negative Seizure: Negative Paresthesias: Negative  Musculoskeletal: Strength & Muscle Tone: within normal limits Gait & Station: normal Patient leans: N/A  Psychiatric Specialty Exam: Review of Systems  Constitutional: Negative.   HENT: Negative.   Respiratory: Negative.   Cardiovascular: Negative.   Gastrointestinal: Negative.   Skin: Negative.   Neurological: Negative.   Psychiatric/Behavioral: Positive for depression. Negative for suicidal ideas. The patient is nervous/anxious.     Blood pressure (!) 138/92, pulse 94, height 5\' 1"  (1.549 m), weight 202 lb (91.6 kg).Body mass index is 38.17 kg/m.  General Appearance: Casual and Fairly Groomed  Eye Contact:  Fair  Speech:  Normal Rate and Pressured  Volume:  Normal  Mood:  Anxious and Depressed  Affect:  Appropriate and Congruent  Thought  Process:  Coherent and Goal Directed  Orientation:  Full (Time, Place, and Person)  Thought Content: Logical   Suicidal Thoughts:  No  Homicidal Thoughts:  No  Memory:  Immediate;   Fair  Judgement:  Fair  Insight:  Fair and Shallow  Psychomotor Activity:  Normal  Concentration:  Concentration: Fair  Recall:  NA  Fund of Knowledge: Good  Language: Good  Akathisia:  Negative  Handed:  Right  AIMS (if indicated):  na  Assets:  Communication Skills Desire for Improvement Financial Resources/Insurance Housing Social Support Transportation  ADL's:  Intact  Cognition: WNL  Sleep:  6-8 hours   Treatment Plan Summary: Brandy [name]  Meda Coffee is a 51 year old female with bipolar depression. This is our second visit.  She previously participated in the partial hospitalization program. We have been working to titrate medications to address ongoing mood lability, irritability, and anxiety. She has had some interval improvements with Brexpiprazole.  Will initiate and titrate Lamictal as below. She remains on clonazepam for ongoing anxiety and irritability. No acute safety issues or substance use. She does struggle with a high level of anxiety and mood lability, and generally poor coping strategies. I believe she would benefit thoroughly from participation in the intensive outpatient program. She is agreeable to do so.  1. Bipolar depression ([place])    Continue Rexulti 2 mg daily Initiate Lamictal 25 mg twice daily, risks and benefits, and risk of Stevens-Johnson syndrome reviewed Increase clonazepam to 0.5 mg twice daily Patient to participate in IOP program Follow-up in 4-6 weeks  Aundra Dubin, MD 07/20/2016, 10:46 AM

## 2016-07-20 NOTE — Telephone Encounter (Signed)
Medication management - Telephone call with patient to inform her prescription was at [facility] instead of [facility] and patient agreed to pick up there with no need to change.

## 2016-08-17 ENCOUNTER — Other Ambulatory Visit: Payer: Self-pay | Admitting: Family Medicine

## 2016-08-17 DIAGNOSIS — K219 Gastro-esophageal reflux disease without esophagitis: Secondary | ICD-10-CM

## 2016-08-31 ENCOUNTER — Ambulatory Visit (INDEPENDENT_AMBULATORY_CARE_PROVIDER_SITE_OTHER): Payer: 59 | Admitting: Psychiatry

## 2016-08-31 DIAGNOSIS — Z91013 Allergy to seafood: Secondary | ICD-10-CM

## 2016-08-31 DIAGNOSIS — F3130 Bipolar disorder, current episode depressed, mild or moderate severity, unspecified: Secondary | ICD-10-CM | POA: Diagnosis not present

## 2016-08-31 DIAGNOSIS — Z811 Family history of alcohol abuse and dependence: Secondary | ICD-10-CM | POA: Diagnosis not present

## 2016-08-31 DIAGNOSIS — Z888 Allergy status to other drugs, medicaments and biological substances status: Secondary | ICD-10-CM | POA: Diagnosis not present

## 2016-08-31 DIAGNOSIS — Z818 Family history of other mental and behavioral disorders: Secondary | ICD-10-CM

## 2016-08-31 DIAGNOSIS — F319 Bipolar disorder, unspecified: Secondary | ICD-10-CM

## 2016-08-31 DIAGNOSIS — Z881 Allergy status to other antibiotic agents status: Secondary | ICD-10-CM

## 2016-08-31 DIAGNOSIS — F17210 Nicotine dependence, cigarettes, uncomplicated: Secondary | ICD-10-CM

## 2016-08-31 DIAGNOSIS — Z7982 Long term (current) use of aspirin: Secondary | ICD-10-CM | POA: Diagnosis not present

## 2016-08-31 DIAGNOSIS — Z79899 Other long term (current) drug therapy: Secondary | ICD-10-CM | POA: Diagnosis not present

## 2016-08-31 MED ORDER — BREXPIPRAZOLE 3 MG PO TABS: 3.0000 mg | ORAL_TABLET | Freq: Every day | ORAL | 1 refills | Status: DC

## 2016-08-31 MED ORDER — LAMOTRIGINE 100 MG PO TABS: 50.0000 mg | ORAL_TABLET | Freq: Two times a day (BID) | ORAL | 2 refills | Status: DC

## 2016-08-31 NOTE — Patient Instructions (Signed)
Increase Rexulti to 3 mg tablet in the morning (go ahead and take 1 mg tablet with your current 2 mg tablet)  Increase Lamictal to 50 mg in morning and at night  Continue Clonazepam at night  Lets get you scheduled for ECT with Dr. Weber Cooks

## 2016-08-31 NOTE — Progress Notes (Signed)
BH MD/PA/NP OP Progress Note  08/31/2016 4:27 PM JAMINA MACBETH  MRN:  [id number]  Chief Complaint:  Chief Complaint    Follow-up     ongoing severe depression  Subjective:  Domingo Pulse resents today for psychiatric follow-up. She continues to suffer with severe depressive symptoms, wants to stay in her bedroom, feeling isolated, tearfulness, low self worth. She continues to have passive thoughts that it would be easier if she just disappeared and died. She denies any thoughts to harm herself. She is irritated with her family when they try to get her to do things like a lot for dinner. She reports that the Lamictal has helped a little bit, but she continues to feel severely depressed. She reports that she has been paranoid at times of someone trying to break into her home, so she has been anxious when she is home alone. There has not been any significant break-ins in the neighborhood, but she did have a likely scam artists knock on her door a few weeks ago.  I spent time reiterating my suggestion that we proceed with an ECT consultation. In addition we agreed to also increse rexulti to 3 mg daily and lamictal to 50 mg BID.  Reviewed the risks and benefits, and she also continues on clonazepam nightly. She never increased it to twice daily. The patient also was supposed to get set up with IOP, but decided not to do this, and prefers to have individual therapy instead. We agreed to arrange this today as well.      Visit Diagnosis:    ICD-9-CM ICD-10-CM   1. Bipolar depression ([place]) 296.50 F31.30 lamoTRIgine (LAMICTAL) 100 MG tablet     Brexpiprazole (REXULTI) 3 MG TABS   Past Psychiatric History: See intake H&P for full details. Reviewed, with no updates at this time.  Past Medical History:  Past Medical History:  Diagnosis Date  . Anxiety   . Depression   . Diverticulitis   . Edema   . Gallstones   . Hypertension   . Osteoarthritis     Past Surgical History:  Procedure  Laterality Date  . GALLBLADDER SURGERY      Family Psychiatric History: See intake H&P for full details. Reviewed, with no updates at this time.   Family History:  Family History  Problem Relation Age of Onset  . Alcohol abuse Mother   . Schizophrenia Maternal Grandmother   . Coronary artery disease Paternal Grandmother        young age  . Diabetes Paternal Grandmother   . Hypertension Father   . Diabetes Sister     Social History:  Social History   Social History  . Marital status: Married    Spouse name: N/A  . Number of children: 3  . Years of education: N/A   Occupational History  . Nurse [employer]   Social History Main Topics  . Smoking status: Current Every Day Smoker    Packs/day: 1.00    Years: 1.50    Types: Cigarettes    Last attempt to quit: 01/03/2015  . Smokeless tobacco: Never Used  . Alcohol use 1.8 - 3.0 oz/week    1 - 2 Glasses of wine, 2 - 3 Cans of beer per week     Comment: Weekends.   . Drug use: Yes    Types: Marijuana     Comment: everyday  . Sexual activity: Yes    Partners: Male    Birth control/ protection: None  Other Topics Concern  . Not on file   Social History Narrative  . No narrative on file    Allergies:  Allergies  Allergen Reactions  . Azithromycin Other (See Comments)    "Stomach cramps"  . Contrast Media [Iodinated Diagnostic Agents] Hives  . 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"    Metabolic Disorder Labs: Lab Results  Component Value Date   HGBA1C 5.70 04/29/2015   MPG 117 02/04/2015   No results found for: PROLACTIN Lab Results  Component Value Date   CHOL 176 02/03/2015   TRIG 121 02/03/2015   HDL 56 02/03/2015   CHOLHDL 3.1 02/03/2015   VLDL 24 02/03/2015   LDLCALC 96 02/03/2015     Current Medications: Current Outpatient Prescriptions  Medication Sig Dispense Refill  . aspirin EC 81 MG EC tablet Take 1 tablet (81 mg total) by mouth daily.    .  Brexpiprazole (REXULTI) 3 MG TABS Take 3 mg by mouth daily. 90 tablet 1  . carvedilol (COREG) 25 MG tablet Take 1 tablet (25 mg total) by mouth 2 (two) times daily with a meal. 180 tablet 1  . clonazePAM (KLONOPIN) 0.5 MG tablet Take 1 tablet (0.5 mg total) by mouth 2 (two) times daily. 180 tablet 3  . furosemide (LASIX) 20 MG tablet Take 1 tablet (20 mg total) by mouth daily. 90 tablet 1  . lamoTRIgine (LAMICTAL) 100 MG tablet Take 0.5 tablets (50 mg total) by mouth 2 (two) times daily. 60 tablet 2  . losartan-hydrochlorothiazide (HYZAAR) 100-25 MG tablet Take 1 tablet by mouth daily. 90 tablet 1  . pantoprazole (PROTONIX) 40 MG tablet Take 1 tablet (40 mg total) by mouth daily. 30 tablet 3  . pantoprazole (PROTONIX) 40 MG tablet TAKE 1 TABLET BY MOUTH DAILY. 30 tablet 0  . potassium chloride SA (K-DUR,KLOR-CON) 20 MEQ tablet Take 1 tablet (20 mEq total) by mouth daily. 90 tablet 1   No current facility-administered medications for this visit.     Neurologic: Headache: Negative Seizure: Negative Paresthesias: Negative  Musculoskeletal: Strength & Muscle Tone: within normal limits Gait & Station: normal Patient leans: N/A  Psychiatric Specialty Exam: Review of Systems  Constitutional: Negative.   HENT: Negative.   Respiratory: Negative.   Cardiovascular: Negative.   Gastrointestinal: Negative.   Skin: Negative.   Neurological: Negative.   Psychiatric/Behavioral: Positive for depression. Negative for suicidal ideas. The patient is nervous/anxious.     Blood pressure 124/82, pulse 81, height 5\' 1"  (1.549 m), weight 203 lb (92.1 kg).Body mass index is 38.36 kg/m.  General Appearance: Casual and Fairly Groomed  Eye Contact:  Fair  Speech:  Clear and Coherent, Normal Rate and Tearful and upset  Volume:  Normal  Mood:  Anxious, Depressed and Irritable  Affect:  Congruent and Tearful  Thought Process:  Goal Directed  Orientation:  Full (Time, Place, and Person)  Thought Content:  Paranoid Ideation and Rumination   Suicidal Thoughts:  No  Homicidal Thoughts:  No  Memory:  Immediate;   Fair  Judgement:  Fair  Insight:  Shallow  Psychomotor Activity:  Normal  Concentration:  Concentration: Fair  Recall:  NA  Fund of Knowledge: Good  Language: Good  Akathisia:  Negative  Handed:  Right  AIMS (if indicated):  na  Assets:  Communication Skills Desire for Improvement Financial Resources/Insurance Housing Social Support Transportation  ADL's:  Intact  Cognition: WNL  Sleep:  6-8 hours   Treatment Plan  Summary: LOISTINE EBERLIN is a 51 year old female with bipolar depression. This is our third visit.  She previously participated in the partial hospitalization program.  We have titrated medications aggressively, but she continues to suffer with severe depressive symptoms, that are significantly impairing her family function and her work function, decreasing her hours from full-time to part-time. We will continue to titrate medications as below, but I believe she would ultimately benefit from treatment with ECT. I will put in a referral to our ECT expert, Dr. Weber Cooks, and I also reviewed the risks and benefits of ECT with the patient. She has excellent support from her husband.  We will follow-up in 6-8 weeks.  1. Bipolar depression ([place])    Increase Rexulti to 3 mg daily Lamictal 50 mg twice daily, risks and benefits, and risk of Stevens-Johnson syndrome reviewed Clonazepam to 0.5 mg patient declines IOP, refer for individual therapy Follow-up in 4-6 weeks Referral to Dr. Weber Cooks for ECT  Aundra Dubin, MD 08/31/2016, 4:27 PM

## 2016-09-05 ENCOUNTER — Other Ambulatory Visit: Payer: Self-pay | Admitting: Family Medicine

## 2016-09-05 DIAGNOSIS — I10 Essential (primary) hypertension: Secondary | ICD-10-CM

## 2016-09-13 ENCOUNTER — Other Ambulatory Visit (HOSPITAL_COMMUNITY): Payer: Self-pay | Admitting: Psychiatry

## 2016-09-13 DIAGNOSIS — F319 Bipolar disorder, unspecified: Secondary | ICD-10-CM

## 2016-10-06 ENCOUNTER — Other Ambulatory Visit: Payer: Self-pay | Admitting: Family Medicine

## 2016-10-06 DIAGNOSIS — I10 Essential (primary) hypertension: Secondary | ICD-10-CM

## 2016-10-06 DIAGNOSIS — K219 Gastro-esophageal reflux disease without esophagitis: Secondary | ICD-10-CM

## 2016-10-12 ENCOUNTER — Encounter (HOSPITAL_COMMUNITY): Payer: Self-pay | Admitting: Psychology

## 2016-10-12 ENCOUNTER — Ambulatory Visit (INDEPENDENT_AMBULATORY_CARE_PROVIDER_SITE_OTHER): Payer: 59 | Admitting: Psychology

## 2016-10-12 DIAGNOSIS — F3130 Bipolar disorder, current episode depressed, mild or moderate severity, unspecified: Secondary | ICD-10-CM

## 2016-10-12 DIAGNOSIS — F319 Bipolar disorder, unspecified: Secondary | ICD-10-CM

## 2016-10-12 NOTE — Progress Notes (Signed)
Comprehensive Clinical Assessment (CCA) Note  10/12/2016 Brandy REEDY [id number]  Visit Diagnosis:      ICD-10-CM   1. Bipolar depression ([place]) F31.30       CCA Part One  Part One has been completed on paper by the patient.  (See scanned document in Chart Review)  CCA Part Two A  Intake/Chief Complaint:  CCA Intake With Chief Complaint CCA Part Two Date: 10/12/16 CCA Part Two Time: 83 Chief Complaint/Presenting Problem: Pt is referred by Dr. Daron Offer who is tx pt for Bipolar 1 d/O.  pt has been in tx w/ IOP in 2017 and first dx w/ depression in 1997.  pt reports that her depression and anxiety returned after medical event in October 2017- not feeling that she could breath- going to ED and was admitted to ICU.  pt reported after that began fearing that if she went to sleep would die in her sleep.  Pt reported that IOP didn't really benefit.  pt returned for care this year w/ Dr. Daron Offer as began feeling paranoid- like someone watching her and not wanting to drive anymore.  pt reports she has stopped working full time and now only parttime and applying for disability.  Patients Currently Reported Symptoms/Problems: pt reports that she has no energy to do anything, stays in her room, lays in her bed, can sleep all the time.  pt reports she doesn't want to drive anymore, doesn't want to go out, ruminating thoughts, worries about her daughter going out.  pt reports feeling paranoid that someone is watching her- stays in room as only safe place.  pt refers to "it" tells me I ca't do things anymore- pt will clarify that she is referring to her brain- self talk.  pt denies any hallucinations.  Individual's Strengths: Pt wants help, family is supportive Individual's Preferences: to feel myself again, feel like I'm living not existing. Type of Services Patient Feels Are Needed: counseling and medicaiton management.   Mental Health Symptoms Depression:  Depression: Change in energy/activity,  Difficulty Concentrating, Fatigue, Hopelessness, Sleep (too much or little), Tearfulness, Worthlessness, Weight gain/loss  Mania:  Mania: N/A  Anxiety:   Anxiety: Difficulty concentrating, Fatigue, Sleep, Worrying, Tension (feels paranoid that someone is watching her)  Psychosis:  Psychosis: N/A  Trauma:  Trauma: N/A  Obsessions:  Obsessions: N/A  Compulsions:  Compulsions: N/A  Inattention:     Hyperactivity/Impulsivity:     Oppositional/Defiant Behaviors:  Oppositional/Defiant Behaviors: N/A  Borderline Personality:  Emotional Irregularity: N/A  Other Mood/Personality Symptoms:      Mental Status Exam Appearance and self-care  Stature:  Stature: Average  Weight:  Weight: Overweight  Clothing:  Clothing: Neat/clean (work uniform)  Grooming:  Grooming: Normal  Cosmetic use:  Cosmetic Use: Age appropriate  Posture/gait:  Posture/Gait: Normal  Motor activity:  Motor Activity: Not Remarkable  Sensorium  Attention:  Attention: Normal  Concentration:  Concentration: Anxiety interferes  Orientation:  Orientation: X5  Recall/memory:  Recall/Memory: Normal  Affect and Mood  Affect:  Affect: Tearful, Depressed  Mood:  Mood: Depressed, Anxious  Relating  Eye contact:  Eye Contact: Normal  Facial expression:  Facial Expression: Depressed  Attitude toward examiner:  Attitude Toward Examiner: Cooperative  Thought and Language  Speech flow: Speech Flow: Normal  Thought content:  Thought Content: Appropriate to mood and circumstances  Preoccupation:  Preoccupations: Ruminations  Hallucinations:     Organization:     Transport planner of Knowledge:  Fund of Knowledge: Average  Intelligence:  Intelligence: Average  Abstraction:  Abstraction: Normal  Judgement:  Judgement: Fair, Normal  Reality Testing:  Reality Testing: Adequate  Insight:  Insight: Good  Decision Making:  Decision Making: Paralyzed  Social Functioning  Social Maturity:  Social Maturity: Isolates  Social  Judgement:  Social Judgement: Normal  Stress  Stressors:  Stressors: Illness  Coping Ability:  Coping Ability: Research officer, political party Deficits:     Supports:      Family and Psychosocial History: Family history Marital status: Married (pt was in previous long term relationship w/ her daughter's father for 12 years) Number of Years Married: 5 What types of issues is patient dealing with in the relationship?: Husband is supportive.  pt feels pressured by him to go out of the house when she doesn't want to.  Does patient have children?: Yes How many children?: 3 How is patient's relationship with their children?: pt has 14y/o son, 43 [name]/o son and 59 [name]/o daughter.  pt reports positive relationship w/ all.  pt does worry about her daughter going out and being harmed.  pt reports son and his wife moving back to [place] and feels abandone her althought knows not doing to be away from her.   Childhood History:  Childhood History By whom was/is the patient raised?: Both parents Additional childhood history information: Born in [place], [place].  Mother was a homemaker alcoholic. pt states her mother would isolate and talk to herself.  Father was self-employed.  States he was a good provider.  He owned boarding houses.  Pt witnessed parents fighting a lot.  Pt states she was very close to her father.  He died suddenly at the age of 11; pt was age 32.  Pt states although she was bullied in school.  She would fight back.  Pt states her grades were good.  "I was the one chosen to help all my siblings with their homework."  Pt denied any abuse.                                                                                                                                     Description of patient's relationship with caregiver when they were a child: Pt was very close to her father. Patient's description of current relationship with people who raised him/her: parents both deceased Does patient have siblings?:  Yes Number of Siblings: 4 Description of patient's current relationship with siblings: older brother, older sister, and 2 younger brothers.  pt reports siblings not close- changed when parents both deceased.  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 How long has patient been employed?: 18 years Patient's job has been impacted by current illness: Yes Describe how patient's job has been impacted: had to shift to part time as too overwhelming Has patient ever been in the [employer]?: No Are There Guns or Other Weapons in [place]?: No  Education: Education Did Teacher, adult education From [employer]?: Yes Did You Have An Individualized Education Program (IIEP): No Did You Have Any Difficulty At School?: No  Religion: Religion/Spirituality Are You A Religious Person?: Yes (Pt reports struggle to go to church- no motivation)  Leisure/Recreation: Leisure / Recreation Leisure and Hobbies: laying in bed- watching tv.   Exercise/Diet: Exercise/Diet Do You Exercise?: No Have You Gained or Lost A Significant Amount of Weight in the Past Six Months?: Yes-Gained Do You Follow a Special Diet?: No Do You Have Any Trouble Sleeping?: No (sleeping too much)  CCA Part Two C  Alcohol/Drug Use: Alcohol / Drug Use History of alcohol / drug use?: Yes Substance #1 Name of Substance 1: marijuana 1 - Frequency: daily 1 - Duration: 2 years                    CCA Part Three  ASAM's:  Six Dimensions of Multidimensional  Assessment  Dimension 1:  Acute Intoxication and/or Withdrawal Potential:     Dimension 2:  Biomedical Conditions and Complications:     Dimension 3:  Emotional, Behavioral, or Cognitive Conditions and Complications:     Dimension 4:  Readiness to Change:     Dimension 5:  Relapse, Continued use, or Continued Problem Potential:     Dimension 6:  Recovery/Living Environment:      Substance use Disorder (SUD)    Social Function:  Social Functioning Social Maturity: Isolates Social Judgement: Normal  Stress:  Stress Stressors: Illness Coping Ability: Exhausted Patient Takes Medications The Way The Doctor Instructed?: Yes Priority Risk: Low Acuity  Risk Assessment- Self-Harm Potential: Risk Assessment For Self-Harm Potential Thoughts of Self-Harm: No current thoughts Method: No plan  Risk Assessment -Dangerous to Others Potential: Risk Assessment For Dangerous to Others Potential Method: No Plan  DSM5 Diagnoses: Patient Active Problem List   Diagnosis Date Noted  . Bipolar disorder ([place]) 04/29/2015  . Insomnia 03/13/2015  . Major depression, recurrent ([place]) 03/10/2015  . Generalized anxiety disorder 03/10/2015  . Dizziness and giddiness 02/20/2015  . GERD (gastroesophageal reflux disease) 02/20/2015  . Abnormal CXR   . Dyspnea 02/02/2015  . Hypoxia 02/02/2015  . Acute pulmonary edema ([place]) 02/02/2015  . Hypertensive urgency 02/02/2015  . Hypertensive crisis   . Nausea with vomiting 06/09/2011  . HTN (hypertension) 12/02/2010  . Diverticulitis of sigmoid colon 12/02/2010  . Tobacco dependence 12/02/2010    Patient Centered Plan: Patient is on the following Treatment Plan(s):  Depression- see tx plan on file  Recommendations for Services/Supports/Treatments: Recommendations for Services/Supports/Treatments Recommendations For Services/Supports/Treatments: Individual Therapy, Medication Management  Treatment Plan Summary:   Pt to continue as scheduled w/ Dr.  Daron Offer and follow his recommendations.  Discussed w/ pt IOP and pt doesn't want to participate in IOP prefer individual counseling.   Jan Fireman

## 2016-10-20 ENCOUNTER — Ambulatory Visit: Payer: Self-pay | Admitting: Family Medicine

## 2016-10-24 ENCOUNTER — Ambulatory Visit: Payer: 59 | Attending: Family Medicine | Admitting: Family Medicine

## 2016-10-24 ENCOUNTER — Encounter: Payer: Self-pay | Admitting: Family Medicine

## 2016-10-24 VITALS — BP 140/81 | HR 74 | Temp 98.3°F | Resp 18 | Ht 61.0 in | Wt 203.0 lb

## 2016-10-24 DIAGNOSIS — Z6835 Body mass index (BMI) 35.0-35.9, adult: Secondary | ICD-10-CM | POA: Insufficient documentation

## 2016-10-24 DIAGNOSIS — R600 Localized edema: Secondary | ICD-10-CM | POA: Diagnosis not present

## 2016-10-24 DIAGNOSIS — I10 Essential (primary) hypertension: Secondary | ICD-10-CM | POA: Diagnosis not present

## 2016-10-24 DIAGNOSIS — Z79899 Other long term (current) drug therapy: Secondary | ICD-10-CM | POA: Insufficient documentation

## 2016-10-24 DIAGNOSIS — R42 Dizziness and giddiness: Secondary | ICD-10-CM | POA: Diagnosis not present

## 2016-10-24 DIAGNOSIS — Z888 Allergy status to other drugs, medicaments and biological substances status: Secondary | ICD-10-CM | POA: Diagnosis not present

## 2016-10-24 DIAGNOSIS — Z7982 Long term (current) use of aspirin: Secondary | ICD-10-CM | POA: Diagnosis not present

## 2016-10-24 DIAGNOSIS — K219 Gastro-esophageal reflux disease without esophagitis: Secondary | ICD-10-CM | POA: Insufficient documentation

## 2016-10-24 DIAGNOSIS — Z0000 Encounter for general adult medical examination without abnormal findings: Secondary | ICD-10-CM

## 2016-10-24 DIAGNOSIS — Z91013 Allergy to seafood: Secondary | ICD-10-CM | POA: Insufficient documentation

## 2016-10-24 DIAGNOSIS — F3162 Bipolar disorder, current episode mixed, moderate: Secondary | ICD-10-CM | POA: Diagnosis not present

## 2016-10-24 DIAGNOSIS — E669 Obesity, unspecified: Secondary | ICD-10-CM | POA: Insufficient documentation

## 2016-10-24 MED ORDER — POTASSIUM CHLORIDE CRYS ER 20 MEQ PO TBCR: 20.0000 meq | EXTENDED_RELEASE_TABLET | Freq: Every day | ORAL | 1 refills | Status: DC

## 2016-10-24 MED ORDER — PANTOPRAZOLE SODIUM 40 MG PO TBEC
40.0000 mg | DELAYED_RELEASE_TABLET | Freq: Every day | ORAL | 1 refills | Status: DC
Start: 2016-10-24 — End: 2017-10-02

## 2016-10-24 MED ORDER — CARVEDILOL 25 MG PO TABS: 25.0000 mg | ORAL_TABLET | Freq: Two times a day (BID) | ORAL | 1 refills | Status: DC

## 2016-10-24 MED ORDER — LOSARTAN POTASSIUM-HCTZ 100-25 MG PO TABS: 1.0000 | ORAL_TABLET | Freq: Every day | ORAL | 1 refills | Status: DC

## 2016-10-24 MED ORDER — FUROSEMIDE 20 MG PO TABS: 20.0000 mg | ORAL_TABLET | Freq: Every day | ORAL | 1 refills | Status: DC

## 2016-10-24 NOTE — Progress Notes (Signed)
Subjective:  Patient ID: Brandy [name], female    DOB: [birth year]  Age: 51 y.o. MRN: [id number]  CC: Hypertension   HPI Brandy [name] is a 51 year old female with a history of hypertension, bipolar disorder, vertigo, GERD, history of pulmonary edema who comes into the clinic for a follow-up visit.  Her bipolar depression is currently uncontrolled and this is affecting her work and her family life.The patient states her depression makes it difficult for her to make appointments as she only wants to stay home and this is taking a toll on her marriage as she has anhedonia; she breaks down crying as she states her family tells her that they would like the "old me back". Denies suicidal ideation [facility] last office visit in 08/2016 at which time the plan was for ECT due to nonresponsiveness; she also continues to take Rexulti, Lamictal and clonazepam and goes for individual counseling sessions.  GERD symptoms are controlled.  Blood pressure is still controlled and she has been compliant with her antihypertensive.  Currently working part-time.   Past Medical History:  Diagnosis Date  . Anxiety   . Depression   . Diverticulitis   . Edema   . Gallstones   . Hypertension   . Osteoarthritis     Past Surgical History:  Procedure Laterality Date  . GALLBLADDER SURGERY      Allergies  Allergen Reactions  . Azithromycin Other (See Comments)    "Stomach cramps"  . Contrast Media [Iodinated Diagnostic Agents] Hives  . 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 aspirin EC 81 MG EC tablet Take 1 tablet (81 mg total) by mouth daily.    . Brexpiprazole (REXULTI) 3 MG TABS Take 3 mg by mouth daily. 90 tablet 1  . clonazePAM (KLONOPIN) 0.5 MG tablet TAKE 1 TABLET BY MOUTH ONCE DAILY AT BEDTIME 30 tablet 1    . carvedilol (COREG) 25 MG tablet Take 1 tablet (25 mg total) by mouth 2 (two) times daily with a meal. 180 tablet 1  . furosemide (LASIX) 20 MG tablet Take 1 tablet (20 mg total) by mouth daily. 90 tablet 1  . losartan-hydrochlorothiazide (HYZAAR) 100-25 MG tablet TAKE 1 TABLET BY MOUTH DAILY. 30 tablet 0  . pantoprazole (PROTONIX) 40 MG tablet Take 1 tablet (40 mg total) by mouth daily. 30 tablet 3  . potassium chloride SA (K-DUR,KLOR-CON) 20 MEQ tablet Take 1 tablet (20 mEq total) by mouth daily. 90 tablet 1  . lamoTRIgine (LAMICTAL) 100 MG tablet Take 0.5 tablets (50 mg total) by mouth 2 (two) times daily. 60 tablet 2  . pantoprazole (PROTONIX) 40 MG tablet TAKE 1 TABLET BY MOUTH DAILY. 30 tablet 0   No facility-administered medications prior to visit.     ROS Review of Systems Constitutional: Negative for activity change, appetite change and fatigue.  HENT: Negative for congestion, sinus pressure and sore throat.   Eyes: Negative for visual disturbance.  Respiratory: Negative for cough, chest tightness, shortness of breath and wheezing.   Cardiovascular: Negative for chest pain and palpitations.  Gastrointestinal: Negative for abdominal distention, abdominal pain and constipation.  Endocrine: Negative for polydipsia.  Genitourinary: Negative for dysuria and frequency.  Musculoskeletal: Negative for arthralgias and back pain.  Skin: Negative for rash.  Neurological: Negative for tremors, light-headedness and numbness.  Hematological: Does not  bruise/bleed easily.  Psychiatric/Behavioral: Positive for dysphoric mood. Negative for agitation and behavioral problems.   Objective:  BP 140/81 (BP Location: Left Arm, Patient Position: Sitting, Cuff Size: Large)   [name] 74   Temp 98.3 F (36.8 C) (Oral)   Resp 18   Ht 5\' 1"  (1.549 m)   Wt 203 lb (92.1 kg)   LMP 09/24/2016   SpO2 100%   BMI 38.36 kg/m   BP/Weight 10/24/2016 08/31/2016 9/35/7017  Systolic BP 793 903 009  Diastolic  BP 81 82 92  Wt. (Lbs) 203 203 202  BMI 38.36 38.36 38.17      Physical Exam Constitutional: She is oriented to person, place, and time. She appears well-developed and well-nourished.  Neck: No JVD present.  Cardiovascular: Normal rate, normal heart sounds and intact distal pulses.   No murmur heard. Pulmonary/Chest: Effort normal and breath sounds normal. She has no wheezes. She has no rales. She exhibits no tenderness.  Abdominal: Soft. Bowel sounds are normal. She exhibits no distension and no mass. There is no tenderness.  Musculoskeletal: Normal range of motion.  Neurological: She is alert and oriented to person, place, and time.  Skin: Skin is warm and dry.  Psychiatric: She exhibits a depressed mood.   Assessment & Plan:   1. Essential hypertension Controlled Low-sodium diet - losartan-hydrochlorothiazide (HYZAAR) 100-25 MG tablet; Take 1 tablet by mouth daily.  Dispense: 90 tablet; Refill: 1 - carvedilol (COREG) 25 MG tablet; Take 1 tablet (25 mg total) by mouth 2 (two) times daily with a meal.  Dispense: 180 tablet; Refill: 1 - Comprehensive metabolic panel - Lipid panel  2. Pedal edema Dependent edema Advised to use compression socks, low-sodium - furosemide (LASIX) 20 MG tablet; Take 1 tablet (20 mg total) by mouth daily.  Dispense: 90 tablet; Refill: 1  3. Gastroesophageal reflux disease without esophagitis Stable - pantoprazole (PROTONIX) 40 MG tablet; Take 1 tablet (40 mg total) by mouth daily.  Dispense: 90 tablet; Refill: 1  4. Bipolar disorder, current episode mixed, moderate (HCC) Uncontrolled with worsening of depressive symptoms No pain crisis at this time Continue Rexulti, clonazepam, Lamictal Keep appointment with mental health and ECT as planned  5. Obesity (BMI 35.0-39.9 without comorbidity) We have discussed walking as a form of exercise and as therapy for depression - TSH  6. Healthcare maintenance Discussed this with the patient At next  visit  Meds ordered this encounter  Medications  . losartan-hydrochlorothiazide (HYZAAR) 100-25 MG tablet    Sig: Take 1 tablet by mouth daily.    Dispense:  90 tablet    Refill:  1    Must have office visit for refills  . potassium chloride SA (K-DUR,KLOR-CON) 20 MEQ tablet    Sig: Take 1 tablet (20 mEq total) by mouth daily.    Dispense:  90 tablet    Refill:  1  . furosemide (LASIX) 20 MG tablet    Sig: Take 1 tablet (20 mg total) by mouth daily.    Dispense:  90 tablet    Refill:  1    Must have office visit for refills  . carvedilol (COREG) 25 MG tablet    Sig: Take 1 tablet (25 mg total) by mouth 2 (two) times daily with a meal.    Dispense:  180 tablet    Refill:  1    Must have office visit for refills  . pantoprazole (PROTONIX) 40 MG tablet    Sig: Take 1 tablet (40 mg total) by  mouth daily.    Dispense:  90 tablet    Refill:  1    Follow-up: Return in about 1 month (around 11/24/2016) for complete physical exam.   Arnoldo Morale MD

## 2016-10-25 LAB — COMPREHENSIVE METABOLIC PANEL
A/G RATIO: 1.4 (ref 1.2–2.2)
ALBUMIN: 4.2 g/dL (ref 3.5–5.5)
ALT: 13 IU/L (ref 0–32)
AST: 13 IU/L (ref 0–40)
Alkaline Phosphatase: 67 IU/L (ref 39–117)
BILIRUBIN TOTAL: 0.2 mg/dL (ref 0.0–1.2)
BUN / CREAT RATIO: 9 (ref 9–23)
BUN: 10 mg/dL (ref 6–24)
CHLORIDE: 103 mmol/L (ref 96–106)
CO2: 26 mmol/L (ref 20–29)
Calcium: 9 mg/dL (ref 8.7–10.2)
Creatinine, Ser: 1.14 mg/dL — ABNORMAL HIGH (ref 0.57–1.00)
GFR calc non Af Amer: 56 mL/min/{1.73_m2} — ABNORMAL LOW (ref >59)
GFR, EST AFRICAN AMERICAN: 64 mL/min/{1.73_m2} (ref >59)
GLOBULIN, TOTAL: 3 g/dL (ref 1.5–4.5)
Glucose: 97 mg/dL (ref 65–99)
POTASSIUM: 3.9 mmol/L (ref 3.5–5.2)
SODIUM: 145 mmol/L — AB (ref 134–144)
TOTAL PROTEIN: 7.2 g/dL (ref 6.0–8.5)

## 2016-10-25 LAB — LIPID PANEL
CHOL/HDL RATIO: 3.3 ratio (ref 0.0–4.4)
Cholesterol, Total: 157 mg/dL (ref 100–199)
HDL: 47 mg/dL (ref >39)
LDL Calculated: 87 mg/dL (ref 0–99)
Triglycerides: 117 mg/dL (ref 0–149)
VLDL CHOLESTEROL CAL: 23 mg/dL (ref 5–40)

## 2016-10-25 LAB — TSH: TSH: 1.29 u[IU]/mL (ref 0.450–4.500)

## 2016-10-27 ENCOUNTER — Telehealth: Payer: Self-pay | Admitting: *Deleted

## 2016-10-27 NOTE — Telephone Encounter (Signed)
Medical Assistant left message on patient's home and cell voicemail. Voicemail states to give a call back to Nubia with [facility] at [phone]. Patient is aware of labs being normal 

## 2016-10-27 NOTE — Telephone Encounter (Signed)
-----   Message from Arnoldo Morale, MD sent at 10/25/2016  5:09 PM EDT ----- Please inform the patient that labs are normal. Thank you.

## 2016-11-01 ENCOUNTER — Ambulatory Visit (INDEPENDENT_AMBULATORY_CARE_PROVIDER_SITE_OTHER): Payer: 59 | Admitting: Psychiatry

## 2016-11-01 ENCOUNTER — Encounter (HOSPITAL_COMMUNITY): Payer: Self-pay | Admitting: Psychiatry

## 2016-11-01 DIAGNOSIS — F17210 Nicotine dependence, cigarettes, uncomplicated: Secondary | ICD-10-CM

## 2016-11-01 DIAGNOSIS — Z811 Family history of alcohol abuse and dependence: Secondary | ICD-10-CM | POA: Diagnosis not present

## 2016-11-01 DIAGNOSIS — F1290 Cannabis use, unspecified, uncomplicated: Secondary | ICD-10-CM | POA: Diagnosis not present

## 2016-11-01 DIAGNOSIS — F3130 Bipolar disorder, current episode depressed, mild or moderate severity, unspecified: Secondary | ICD-10-CM | POA: Diagnosis not present

## 2016-11-01 DIAGNOSIS — Z818 Family history of other mental and behavioral disorders: Secondary | ICD-10-CM

## 2016-11-01 DIAGNOSIS — F319 Bipolar disorder, unspecified: Secondary | ICD-10-CM

## 2016-11-01 MED ORDER — LAMOTRIGINE 100 MG PO TABS: 100.0000 mg | ORAL_TABLET | Freq: Two times a day (BID) | ORAL | 1 refills | Status: DC

## 2016-11-01 NOTE — Progress Notes (Signed)
[place] MD/PA/NP OP Progress Note  11/01/2016 10:33 AM Brandy [name]  MRN:  [id number]  Chief Complaint:   ongoing severe depression  Subjective:  Brandy [name] presents for med management follow-up. She has noticed some positive traction in her mood with the increase in Lamictal. She is now taking 50 mg twice daily. She would like to wait to see if this continues to help before she considers ECT. She is working with an Haematologist in this office instead of IOP.    I spent time with the patient noticing that she was well put together today, had her hair fixed up, was well dressed, and she admits that she has been hanging more attention to her external appearance and taking more care of herself. We reviewed her laboratory studies from last week, and everything appeared normal, I encouraged her to increase her water intake.  She reports that she did faint over the weekend due to heat and dehydration, and she agrees she needs to increase her water intake. No head injury or headaches. This was in the context of her going to a family barbecue. I applauded her making efforts to go to a family barbecue.  We agreed to increase Lamictal to 100 mg twice daily and continue on the current medications otherwise as prescribed. We agreed to follow-up in 2 months or sooner if needed.  Visit Diagnosis:    ICD-10-CM   1. Bipolar depression ([place]) F31.30 lamoTRIgine (LAMICTAL) 100 MG tablet   Past Psychiatric History: See intake H&P for full details. Reviewed, with no updates at this time.  Past Medical History:  Past Medical History:  Diagnosis Date  . Anxiety   . Depression   . Diverticulitis   . Edema   . Gallstones   . Hypertension   . Osteoarthritis     Past Surgical History:  Procedure Laterality Date  . GALLBLADDER SURGERY      Family Psychiatric History: See intake H&P for full details. Reviewed, with no updates at this time.   Family History:  Family History  Problem  Relation Age of Onset  . Alcohol abuse Mother   . Schizophrenia Maternal Grandmother   . Coronary artery disease Paternal Grandmother        young age  . Diabetes Paternal Grandmother   . Hypertension Father   . Diabetes Sister   . Schizophrenia Maternal Aunt     Social History:  Social History   Social History  . Marital status: Married    Spouse name: N/A  . Number of children: 3  . Years of education: N/A   Occupational History  . Nurse [employer]   Social History Main Topics  . Smoking status: Current Every Day Smoker    Packs/day: 0.50    Years: 1.50    Types: Cigarettes  . Smokeless tobacco: Never Used  . Alcohol use 1.2 oz/week    1 Glasses of wine, 1 Cans of beer per week     Comment: occasionaly  . Drug use: Yes    Types: Marijuana     Comment: everyday  . Sexual activity: Yes    Partners: Male    Birth control/ protection: None   Other Topics Concern  . None   Social History Narrative  . None    Allergies:  Allergies  Allergen Reactions  . Azithromycin Other (See Comments)    "Stomach cramps"  . Contrast Media [Iodinated Diagnostic Agents] Hives  . Shellfish Allergy Swelling and Other (See  Comments)    "eyes went blind"    Metabolic Disorder Labs: Lab Results  Component Value Date   HGBA1C 5.70 04/29/2015   MPG 117 02/04/2015   No results found for: PROLACTIN Lab Results  Component Value Date   CHOL 157 10/24/2016   TRIG 117 10/24/2016   HDL 47 10/24/2016   CHOLHDL 3.3 10/24/2016   VLDL 24 02/03/2015   LDLCALC 87 10/24/2016   LDLCALC 96 02/03/2015     Current Medications: Current Outpatient Prescriptions  Medication Sig Dispense Refill  . aspirin EC 81 MG EC tablet Take 1 tablet (81 mg total) by mouth daily.    . Brexpiprazole (REXULTI) 3 MG TABS Take 3 mg by mouth daily. 90 tablet 1  . carvedilol (COREG) 25 MG tablet Take 1 tablet (25 mg total) by mouth 2 (two) times daily with a meal. 180 tablet 1  . clonazePAM  (KLONOPIN) 0.5 MG tablet TAKE 1 TABLET BY MOUTH ONCE DAILY AT BEDTIME 30 tablet 1  . furosemide (LASIX) 20 MG tablet Take 1 tablet (20 mg total) by mouth daily. 90 tablet 1  . lamoTRIgine (LAMICTAL) 100 MG tablet Take 1 tablet (100 mg total) by mouth 2 (two) times daily. 180 tablet 1  . losartan-hydrochlorothiazide (HYZAAR) 100-25 MG tablet Take 1 tablet by mouth daily. 90 tablet 1  . pantoprazole (PROTONIX) 40 MG tablet Take 1 tablet (40 mg total) by mouth daily. 90 tablet 1  . potassium chloride SA (K-DUR,KLOR-CON) 20 MEQ tablet Take 1 tablet (20 mEq total) by mouth daily. 90 tablet 1   No current facility-administered medications for this visit.     Neurologic: Headache: Negative Seizure: Negative Paresthesias: Negative  Musculoskeletal: Strength & Muscle Tone: within normal limits Gait & Station: normal Patient leans: N/A  Psychiatric Specialty Exam: Review of Systems  Constitutional: Negative.   HENT: Negative.   Respiratory: Negative.   Cardiovascular: Negative.   Gastrointestinal: Negative.   Skin: Negative.   Neurological: Negative.   Psychiatric/Behavioral: Positive for depression. Negative for hallucinations, substance abuse and suicidal ideas. The patient does not have insomnia.     Blood pressure 130/80, [name] 81, height 5\' 1"  (1.549 m), weight 193 lb 3.2 oz (87.6 kg).Body mass index is 36.5 kg/m.  General Appearance: Casual and Fairly Groomed  Eye Contact:  Fair  Speech:  Clear and Coherent and Normal Rate  Volume:  Normal  Mood:  Dysphoric  Affect:  Congruent, Depressed and improving  Thought Process:  Goal Directed  Orientation:  Full (Time, Place, and Person)  Thought Content: Paranoid Ideation and Rumination   Suicidal Thoughts:  No  Homicidal Thoughts:  No  Memory:  Immediate;   Fair  Judgement:  Fair  Insight:  Shallow  Psychomotor Activity:  Normal  Concentration:  Concentration: Fair  Recall:  NA  Fund of Knowledge: Good  Language: Good   Akathisia:  Negative  Handed:  Right  AIMS (if indicated):  na  Assets:  Communication Skills Desire for Improvement Financial Resources/Insurance Housing Social Support Transportation  ADL's:  Intact  Cognition: WNL  Sleep:  7 hours   Treatment Plan Summary: Brandy [name] is a 51 year old female with bipolar depression. She has started to gain some positive traction with Lamictal in conjunction with Rexulti.  She would benefit from further titration of Lamictal, and we will continue to consider ECT if her depressive symptoms do not continue to improve. She is working with an Haematologist in his office and will follow-up with Probation officer in  2 months. No acute safety issues at this time.  1. Bipolar depression ([facility])    Continue Rexulti 3 mg daily Increase Lamictal to 100 mg twice daily Clonazepam to 0.5 mg  Follow-up in 6-8 weeks Referral to Dr. Weber Cooks for ECT, patient wishes to hold off at this time  Aundra Dubin, MD 11/01/2016, 10:33 AM

## 2016-11-30 ENCOUNTER — Ambulatory Visit (HOSPITAL_COMMUNITY): Payer: Self-pay | Admitting: Psychology

## 2016-12-13 ENCOUNTER — Ambulatory Visit (INDEPENDENT_AMBULATORY_CARE_PROVIDER_SITE_OTHER): Payer: 59 | Admitting: Psychology

## 2016-12-13 DIAGNOSIS — F319 Bipolar disorder, unspecified: Secondary | ICD-10-CM

## 2016-12-13 DIAGNOSIS — F3130 Bipolar disorder, current episode depressed, mild or moderate severity, unspecified: Secondary | ICD-10-CM | POA: Diagnosis not present

## 2016-12-13 NOTE — Progress Notes (Signed)
   THERAPIST PROGRESS NOTE  Session Time: 10am-10.35am  Participation Level: Active  Behavioral Response: Well GroomedAlertDepressed  Type of Therapy: Individual Therapy  Treatment Goals addressed: Diagnosis: Bipolar D/O and goal 1.  Interventions: CBT and Supportive  Summary: Brandy [name] is a 51 y.o. female who presents with affect congruent w/ report of tired.  Pt just finished her 4 hour shift.  Pt reports hard for her to do the 4 hour shifts- just feels exhausted. Pt discussed that she still struggles w/ sleep- feels always waking.  Pt reported that has gotten out some- but wants to just remain in her room. Pt reports husband pushes her to go out and sometimes approach makes her not want to even more.  Pt was able to identify the things she would like to do for self to initiate getting out of room even if for just 15 minutes at a time or what interest her to get out of the house.  Pt reported that sister's visit was positive.  Pt identified that the clicking sound of husband new cane is very irritating and identify how she could talk to him about.   Suicidal/Homicidal: Nowithout intent/plan  Therapist Response: Assessed pt current functioning per pt report. Processed w/ pt coping w/ depression, lack of motivation and activating/initiating activities.  Discussed how to communicate her concerns and wants w/ husband that would be more effective.   Plan: Return again in 2 weeks.  Diagnosis: Bipolar 1 D/o depressed    YATES,LEANNE, LPC 12/13/2016

## 2016-12-16 ENCOUNTER — Ambulatory Visit (HOSPITAL_COMMUNITY): Payer: 59 | Admitting: Psychiatry

## 2016-12-27 ENCOUNTER — Ambulatory Visit (INDEPENDENT_AMBULATORY_CARE_PROVIDER_SITE_OTHER): Payer: 59 | Admitting: Psychology

## 2016-12-27 DIAGNOSIS — F3130 Bipolar disorder, current episode depressed, mild or moderate severity, unspecified: Secondary | ICD-10-CM | POA: Diagnosis not present

## 2016-12-27 DIAGNOSIS — F319 Bipolar disorder, unspecified: Secondary | ICD-10-CM

## 2016-12-27 NOTE — Progress Notes (Signed)
   THERAPIST PROGRESS NOTE  Session Time: 9am-9.30am  Participation Level: Active  Behavioral Response: Well GroomedAlertaffect wnl  Type of Therapy: Individual Therapy  Treatment Goals addressed: Diagnosis: Bipolar 1 and goal 1.  Interventions: CBT and Other: grounding   Summary: Brandy [name] is a 51 y.o. female who presents with affect wnl.  Pt reports just getting off work- day was busy and ready for nap.  Pt reported working towards balance of getting out of her room and the house.  Pt did report made effort to be in family room over weekend, went to funeral and went w/ sister to take daughter to movies as promised. Pt reported that over the weekend did feel drained however and kept falling asleep.  Pt was able to identify worries that experienced going out in traffic and how can reframe.  Pt also discussed at times difficulty to let go of certain thought- becomes compulsive.  Pt willing for grounding to assist in coping through. .   Suicidal/Homicidal: Nowithout intent/plan  Therapist Response: Assessed pt current functioning per pt report.  Processed w/pt coping w/ anxiety and leaving house and room.  Focused on successes and building on these.  Discussed distortions and reframes.  Introduced to Oncologist w/ mantra paired w/ breathe.   Plan: Return again in 2-3 weeks.  Diagnosis: Bipolar 1     YATES,LEANNE, LPC 12/27/2016

## 2017-01-16 ENCOUNTER — Ambulatory Visit (INDEPENDENT_AMBULATORY_CARE_PROVIDER_SITE_OTHER): Payer: 59 | Admitting: Psychology

## 2017-01-16 DIAGNOSIS — F319 Bipolar disorder, unspecified: Secondary | ICD-10-CM

## 2017-01-16 DIAGNOSIS — F3130 Bipolar disorder, current episode depressed, mild or moderate severity, unspecified: Secondary | ICD-10-CM

## 2017-01-16 NOTE — Progress Notes (Signed)
   THERAPIST PROGRESS NOTE  Session Time: 10am-10.40am  Participation Level: Active  Behavioral Response: Well GroomedAlertblunted affect  Type of Therapy: Individual Therapy  Treatment Goals addressed: Diagnosis: Bipolar 1 and goal 1.  Interventions: CBT and Supportive  Summary: Brandy [name] is a 51 y.o. female who presents with affect blunted.  Pt reports that she feels so anxious out of her room which is her comfort zone.  Pt reported very anxious retuning to work this morning after a couple days off but did ok once there. Pt reported she still wants to sleep all the time, no energy and prefers to stay in her room.  Pt reported son and his family stayed this weekend as no electricity- pt reported this was stressful- but coped by reminded would come to end. Pt reported that sister stopped by and disliked having to come out of room to talk to her.  Pt was able to identify that both had positives as had some good moments w/ each as well. Pt discussed want to get back to her norm- pt increased awareness of need to begin challenging self towards increased activity, decreased avoidance. Pt also discussed not taking meds as prescribed- needing to follow as prescribed- reports taking only one lamictal a day and needing to refill other 2 meds.   Suicidal/Homicidal: Nowithout intent/plan  Therapist Response: Assessed pt current functioning per pt report. Processed w/pt interactions over weekend and assisted pt in seeing as positives instead of negatives to be avoided.  Explored w/pt ways to challenge self towards activities she wants to get back to.   Plan: Return again in 2 weeks.  Diagnosis: Bipolar, Depressed       YATES,LEANNE, [place] 01/16/2017

## 2017-01-30 ENCOUNTER — Ambulatory Visit (HOSPITAL_COMMUNITY): Payer: Self-pay | Admitting: Psychology

## 2017-01-31 ENCOUNTER — Other Ambulatory Visit (HOSPITAL_COMMUNITY): Payer: Self-pay | Admitting: Psychiatry

## 2017-01-31 DIAGNOSIS — F319 Bipolar disorder, unspecified: Secondary | ICD-10-CM

## 2017-02-13 ENCOUNTER — Encounter (HOSPITAL_COMMUNITY): Payer: Self-pay | Admitting: Psychiatry

## 2017-02-13 ENCOUNTER — Ambulatory Visit (INDEPENDENT_AMBULATORY_CARE_PROVIDER_SITE_OTHER): Payer: 59 | Admitting: Psychiatry

## 2017-02-13 DIAGNOSIS — F3130 Bipolar disorder, current episode depressed, mild or moderate severity, unspecified: Secondary | ICD-10-CM

## 2017-02-13 DIAGNOSIS — F1210 Cannabis abuse, uncomplicated: Secondary | ICD-10-CM

## 2017-02-13 DIAGNOSIS — Z818 Family history of other mental and behavioral disorders: Secondary | ICD-10-CM

## 2017-02-13 DIAGNOSIS — F17210 Nicotine dependence, cigarettes, uncomplicated: Secondary | ICD-10-CM | POA: Diagnosis not present

## 2017-02-13 DIAGNOSIS — Z811 Family history of alcohol abuse and dependence: Secondary | ICD-10-CM | POA: Diagnosis not present

## 2017-02-13 DIAGNOSIS — F319 Bipolar disorder, unspecified: Secondary | ICD-10-CM

## 2017-02-13 MED ORDER — TEMAZEPAM 15 MG PO CAPS: 15.0000 mg | ORAL_CAPSULE | Freq: Every day | ORAL | 2 refills | Status: DC

## 2017-02-13 MED ORDER — LAMOTRIGINE 100 MG PO TABS: 100.0000 mg | ORAL_TABLET | Freq: Two times a day (BID) | ORAL | 1 refills | Status: DC

## 2017-02-13 MED ORDER — BREXPIPRAZOLE 3 MG PO TABS
3.0000 mg | ORAL_TABLET | Freq: Every day | ORAL | 1 refills | Status: DC
Start: 2017-02-13 — End: 2017-05-15

## 2017-02-13 NOTE — Progress Notes (Signed)
[place] MD/PA/NP OP Progress Note  02/13/2017 11:28 AM Brandy [name]  MRN:  [id number]  Chief Complaint: doing a little better HPI: Brandy [name] presents with improved affect, smiling, and laughed several times during her interaction.  She reports that she is trying her hardest, and has tried to start helping herself more by consistently attending therapy, reading her Bible, and reports that she feels more confident that she can beat her depression.  She continues Lamictal 100 mg twice daily, and Rexulti 3 mg in the evening.  She reports that she continues the clonazepam at night for sleep, but tends to feel fatigued and tired in the morning so she has to take clonazepam early in the evening.  I discussed switching her from clonazepam to Restoril 15-30 mg nightly, and she was agreeable with this.  She denies any suicidal thoughts.  She continues to feel like a burden sometimes, but this is less than before.  She feels like she is overall having some improvements, and I spent time with her celebrating and thinking about how we will manage any bumps in the road that should arise.  She will follow-up with this writer in 3 months and continue in individual therapy.  Visit Diagnosis:    ICD-10-CM   1. Bipolar depression ([place]) F31.30 lamoTRIgine (LAMICTAL) 100 MG tablet    Brexpiprazole (REXULTI) 3 MG TABS    Past Psychiatric History: See intake H&P for full details. Reviewed, with no updates at this time.  Past Medical History:  Past Medical History:  Diagnosis Date  . Anxiety   . Depression   . Diverticulitis   . Edema   . Gallstones   . Hypertension   . Osteoarthritis     Past Surgical History:  Procedure Laterality Date  . GALLBLADDER SURGERY      Family Psychiatric History: See intake H&P for full details. Reviewed, with no updates at this time.   Family History:  Family History  Problem Relation Age of Onset  . Alcohol abuse Mother   . Schizophrenia Maternal Grandmother    . Coronary artery disease Paternal Grandmother        young age  . Diabetes Paternal Grandmother   . Hypertension Father   . Diabetes Sister   . Schizophrenia Maternal Aunt     Social History:  Social History   Socioeconomic History  . Marital status: Married    Spouse name: None  . Number of children: 3  . Years of education: None  . Highest education level: None  Social Needs  . Financial resource strain: None  . Food insecurity - worry: None  . Food insecurity - inability: None  . Transportation needs - medical: None  . Transportation needs - non-medical: None  Occupational History  . Occupation: Research scientist (life sciences): [employer]  Tobacco Use  . Smoking status: Current Every Day Smoker    Packs/day: 0.50    Years: 1.50    Pack years: 0.75    Types: Cigarettes  . Smokeless tobacco: Never Used  Substance and Sexual Activity  . Alcohol use: Yes    Alcohol/week: 1.2 oz    Types: 1 Glasses of wine, 1 Cans of beer per week    Comment: occasionaly  . Drug use: Yes    Types: Marijuana    Comment: everyday  . Sexual activity: Yes    Partners: Male    Birth control/protection: None  Other Topics Concern  . None  Social History Narrative  .  None    Allergies:  Allergies  Allergen Reactions  . Azithromycin Other (See Comments)    "Stomach cramps"  . Contrast Media [Iodinated Diagnostic Agents] Hives  . 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"    Metabolic Disorder Labs: Lab Results  Component Value Date   HGBA1C 5.70 04/29/2015   MPG 117 02/04/2015   No results found for: PROLACTIN Lab Results  Component Value Date   CHOL 157 10/24/2016   TRIG 117 10/24/2016   HDL 47 10/24/2016   CHOLHDL 3.3 10/24/2016   VLDL 24 02/03/2015   LDLCALC 87 10/24/2016   LDLCALC 96 02/03/2015   Lab Results  Component Value Date   TSH 1.290 10/24/2016   TSH 1.942 02/02/2015    Therapeutic Level Labs: No results found for:  LITHIUM No results found for: VALPROATE No components found for:  CBMZ  Current Medications: Current Outpatient Medications  Medication Sig Dispense Refill  . aspirin EC 81 MG EC tablet Take 1 tablet (81 mg total) by mouth daily.    . Brexpiprazole (REXULTI) 3 MG TABS Take 3 mg daily by mouth. 90 tablet 1  . carvedilol (COREG) 25 MG tablet Take 1 tablet (25 mg total) by mouth 2 (two) times daily with a meal. 180 tablet 1  . furosemide (LASIX) 20 MG tablet Take 1 tablet (20 mg total) by mouth daily. 90 tablet 1  . lamoTRIgine (LAMICTAL) 100 MG tablet Take 1 tablet (100 mg total) 2 (two) times daily by mouth. 180 tablet 1  . losartan-hydrochlorothiazide (HYZAAR) 100-25 MG tablet Take 1 tablet by mouth daily. 90 tablet 1  . pantoprazole (PROTONIX) 40 MG tablet Take 1 tablet (40 mg total) by mouth daily. 90 tablet 1  . potassium chloride SA (K-DUR,KLOR-CON) 20 MEQ tablet Take 1 tablet (20 mEq total) by mouth daily. 90 tablet 1  . temazepam (RESTORIL) 15 MG capsule Take 1 capsule (15 mg total) at bedtime by mouth. Take 1-2 capsules at bedtime 60 capsule 2   No current facility-administered medications for this visit.      Musculoskeletal: Strength & Muscle Tone: within normal limits Gait & Station: normal Patient leans: N/A  Psychiatric Specialty Exam: ROS  Blood pressure 130/76, pulse 73, height 5\' 1"  (1.549 m), weight 187 lb 3.2 oz (84.9 kg).Body mass index is 35.37 kg/m.  General Appearance: Casual and Fairly Groomed  Eye Contact:  better  Speech:  Clear and Coherent and Normal Rate  Volume:  Normal  Mood:  Euthymic and better  Affect:  Congruent  Thought Process:  Goal Directed and Descriptions of Associations: Intact  Orientation:  Full (Time, Place, and Person)  Thought Content: Logical   Suicidal Thoughts:  No  Homicidal Thoughts:  No  Memory:  Immediate;   Fair  Judgement:  Good  Insight:  Fair  Psychomotor Activity:  Normal  Concentration:  Concentration: Good   Recall:  Good  Fund of Knowledge: Good  Language: Good  Akathisia:  Negative  Handed:  Right  AIMS (if indicated): done  Assets:  Communication Skills Desire for Improvement Financial Resources/Insurance Housing Intimacy Social Support Talents/Skills Transportation Vocational/Educational  ADL's:  Intact  Cognition: WNL  Sleep:  Good   Screenings: GAD-7     Office Visit from 10/24/2016 in [facility] Office Visit from 11/16/2015 in [facility] Office Visit from 04/29/2015 in [facility] Office Visit from 02/13/2015 in [facility]  [place] [facility]  Total GAD-7 Score  8  20  20  19     PHQ2-9     Office Visit from 10/24/2016 in [facility] Office Visit from 11/16/2015 in [facility] Counselor from 05/26/2015 in [facility] [facility] Office Visit from 04/29/2015 in [facility] Office Visit from 03/13/2015 in [facility]  PHQ-2 Total Score  2  6  6  5  6   PHQ-9 Total Score  10  24  25  23   No data       Assessment and Plan:  Brandy [name] is a 51 year old female with bipolar depression, gradually improving on the current regimen and participation in individual therapy.  No acute safety issues.  Spent time today discussing contingency planning and management of any acute issues that should arise.  Also spent time educating her on long-term benzodiazepine use, and agreeing to switch to a shorter acting evening Restoril instead of clonazepam.   1. Bipolar depression ([place])     Status of current problems: gradually improving  Labs Ordered: No orders of the defined types were placed in this encounter.   Labs Reviewed: NA  Collateral Obtained/Records Reviewed: N/A  Plan:  Continue Lamictal 100 mg twice daily Continue Rexulti 3 mg in the evening,  increase to 4 mg if she has any decompensation over the next few months Discontinue clonazepam 0.5 mg nightly Initiate Restoril 15-30 mg nightly for sleep Return to clinic in 3 months Continue in individual therapy  I spent 25 minutes with the patient in direct face-to-face clinical care.  Greater than 50% of this time was spent in counseling and coordination of care with the patient.    Aundra Dubin, MD 02/13/2017, 11:28 AM

## 2017-02-20 ENCOUNTER — Encounter (HOSPITAL_COMMUNITY): Payer: Self-pay | Admitting: Psychology

## 2017-02-20 ENCOUNTER — Ambulatory Visit (HOSPITAL_COMMUNITY): Payer: Self-pay | Admitting: Psychology

## 2017-02-20 NOTE — Progress Notes (Signed)
Brandy [name] is a 51 y.o. female patient who didn't show for appointment.  Letter sent.        Jan Fireman, LPC

## 2017-03-13 ENCOUNTER — Ambulatory Visit (HOSPITAL_COMMUNITY): Payer: Self-pay | Admitting: Psychology

## 2017-04-03 ENCOUNTER — Ambulatory Visit (HOSPITAL_COMMUNITY): Payer: Self-pay | Admitting: Psychology

## 2017-05-15 ENCOUNTER — Ambulatory Visit (INDEPENDENT_AMBULATORY_CARE_PROVIDER_SITE_OTHER): Payer: 59 | Admitting: Psychiatry

## 2017-05-15 ENCOUNTER — Encounter (HOSPITAL_COMMUNITY): Payer: Self-pay | Admitting: Psychiatry

## 2017-05-15 DIAGNOSIS — Z79899 Other long term (current) drug therapy: Secondary | ICD-10-CM | POA: Diagnosis not present

## 2017-05-15 DIAGNOSIS — Z811 Family history of alcohol abuse and dependence: Secondary | ICD-10-CM

## 2017-05-15 DIAGNOSIS — F17210 Nicotine dependence, cigarettes, uncomplicated: Secondary | ICD-10-CM

## 2017-05-15 DIAGNOSIS — F314 Bipolar disorder, current episode depressed, severe, without psychotic features: Secondary | ICD-10-CM | POA: Diagnosis not present

## 2017-05-15 DIAGNOSIS — Z818 Family history of other mental and behavioral disorders: Secondary | ICD-10-CM | POA: Diagnosis not present

## 2017-05-15 DIAGNOSIS — F332 Major depressive disorder, recurrent severe without psychotic features: Secondary | ICD-10-CM

## 2017-05-15 DIAGNOSIS — F319 Bipolar disorder, unspecified: Secondary | ICD-10-CM

## 2017-05-15 MED ORDER — LURASIDONE HCL 40 MG PO TABS: 40.0000 mg | ORAL_TABLET | Freq: Every day | ORAL | 1 refills | Status: DC

## 2017-05-15 NOTE — Progress Notes (Signed)
BH MD/PA/NP OP Progress Note  05/15/2017 10:24 AM Brandy [name]  MRN:  [id number]  Chief Complaint: Feel like I am in a hole still HPI: Patient presents for medication management follow-up.  She feels like her response to Lamictal has also met a plateau.  Denies any skin rash or intolerance.  She continues Lamictal, Rexulti, temazepam as prescribed.  She continues to feel severely depressed all day, but describes melancholic thoughts and feelings, feels hopeless.  Denies any thoughts to harm herself, but feels sick and tired of feeling so depressed.  She reports that her family is frustrated with her and does not seem to understand.  I spent time with her considering ECT once again.  She reports she has been thinking about this lately as well and has decided she would like to go ahead and meet with the consultant.  We discussed discontinuing Rexulti given the long half-life and that she has not had any significant improvements, and switching to Latuda in conjunction with continued Lamictal.  She was agreeable to this and we reviewed the risks and benefits of Latuda.  We will follow-up in 8-10 weeks, and patient was instructed to call ECT consultation.  This Probation officer also called the clinic and left a message.  Visit Diagnosis:    ICD-10-CM   1. Bipolar depression ([place]) F31.30 Ambulatory referral to Psychiatry    lurasidone (LATUDA) 40 MG TABS tablet  2. 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 Reviewed, with no updates at this time.   Past Medical History:  Past Medical History:  Diagnosis Date  . Anxiety   . Depression   . Diverticulitis   . Edema   . Gallstones   . Hypertension   . Osteoarthritis     Past Surgical History:  Procedure Laterality Date  . GALLBLADDER SURGERY      Family Psychiatric History: See intake H&P for full details. Reviewed, with no  updates at this time.   Family History:  Family History  Problem Relation Age of Onset  . Alcohol abuse Mother   . Schizophrenia Maternal Grandmother   . Coronary artery disease Paternal Grandmother        young age  . Diabetes Paternal Grandmother   . Hypertension Father   . Diabetes Sister   . Schizophrenia Maternal Aunt     Social History:  Social History   Socioeconomic History  . Marital status: Married    Spouse name: None  . Number of children: 3  . Years of education: None  . Highest education level: None  Social Needs  . Financial resource strain: None  . Food insecurity - worry: None  . Food insecurity - inability: None  . Transportation needs - medical: None  . Transportation needs - non-medical: None  Occupational History  . Occupation: Research scientist (life sciences): [employer]  Tobacco Use  . Smoking status: Current Every Day Smoker    Packs/day: 0.50    Years: 1.50    Pack years: 0.75    Types: Cigarettes  . Smokeless tobacco: Never Used  Substance and Sexual Activity  . Alcohol use: Yes    Alcohol/week: 1.2 oz    Types: 1 Glasses of wine, 1 Cans of beer per week    Comment: occasionaly  . Drug use: Yes    Types: Marijuana    Comment: stopped a couple of weeks ago  .  Sexual activity: Yes    Partners: Male    Birth control/protection: None  Other Topics Concern  . None  Social History Narrative  . None    Allergies:  Allergies  Allergen Reactions  . Azithromycin Other (See Comments)    "Stomach cramps"  . Contrast Media [Iodinated Diagnostic Agents] Hives  . 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"    Metabolic Disorder Labs: Lab Results  Component Value Date   HGBA1C 5.70 04/29/2015   MPG 117 02/04/2015   No results found for: PROLACTIN Lab Results  Component Value Date   CHOL 157 10/24/2016   TRIG 117 10/24/2016   HDL 47 10/24/2016   CHOLHDL 3.3 10/24/2016   VLDL 24 02/03/2015   LDLCALC 87  10/24/2016   LDLCALC 96 02/03/2015   Lab Results  Component Value Date   TSH 1.290 10/24/2016   TSH 1.942 02/02/2015    Therapeutic Level Labs: No results found for: LITHIUM No results found for: VALPROATE No components found for:  CBMZ  Current Medications: Current Outpatient Medications  Medication Sig Dispense Refill  . aspirin EC 81 MG EC tablet Take 1 tablet (81 mg total) by mouth daily.    . carvedilol (COREG) 25 MG tablet Take 1 tablet (25 mg total) by mouth 2 (two) times daily with a meal. 180 tablet 1  . furosemide (LASIX) 20 MG tablet Take 1 tablet (20 mg total) by mouth daily. 90 tablet 1  . lamoTRIgine (LAMICTAL) 100 MG tablet Take 1 tablet (100 mg total) 2 (two) times daily by mouth. 180 tablet 1  . losartan-hydrochlorothiazide (HYZAAR) 100-25 MG tablet Take 1 tablet by mouth daily. 90 tablet 1  . pantoprazole (PROTONIX) 40 MG tablet Take 1 tablet (40 mg total) by mouth daily. 90 tablet 1  . potassium chloride SA (K-DUR,KLOR-CON) 20 MEQ tablet Take 1 tablet (20 mEq total) by mouth daily. 90 tablet 1  . temazepam (RESTORIL) 15 MG capsule Take 1 capsule (15 mg total) at bedtime by mouth. Take 1-2 capsules at bedtime 60 capsule 2  . lurasidone (LATUDA) 40 MG TABS tablet Take 1 tablet (40 mg total) by mouth daily with breakfast. 90 tablet 1   No current facility-administered medications for this visit.      Musculoskeletal: Strength & Muscle Tone: within normal limits Gait & Station: normal Patient leans: N/A  Psychiatric Specialty Exam: ROS  Blood pressure 128/80, pulse 86, height 5' 1"  (1.549 m), weight 176 lb 6.4 oz (80 kg).Body mass index is 33.33 kg/m.  General Appearance: Casual and Fairly Groomed  Eye Contact:  Fair  Speech:  Normal Rate  Volume:  Decreased  Mood:  Depressed and Dysphoric  Affect:  Congruent, Depressed and Flat  Thought Process:  Goal Directed and Descriptions of Associations: Intact  Orientation:  Full (Time, Place, and Person)   Thought Content: Logical   Suicidal Thoughts:  No  Homicidal Thoughts:  No  Memory:  Immediate;   Fair  Judgement:  Fair  Insight:  Fair  Psychomotor Activity:  Decreased  Concentration:  Concentration: Fair  Recall:  [employer] of Knowledge: Fair  Language: Fair  Akathisia:  Negative  Handed:  Right  AIMS (if indicated): 0/10  Assets:  Communication Skills Desire for Improvement Financial Resources/Insurance Housing Social Support Transportation Vocational/Educational  ADL's:  Intact  Cognition: WNL  Sleep:  Good   Screenings: AIMS     Office Visit from 05/15/2017 in [facility]  AIMS Total Score  0    GAD-7     Office Visit from 10/24/2016 in [place] Office Visit from 11/16/2015 in [facility] Office Visit from 04/29/2015 in [place] [facility] Office Visit from 02/13/2015 in [place] [place]  Total GAD-7 Score  8  20  20  19     PHQ2-9     Office Visit from 10/24/2016 in [facility] Office Visit from 11/16/2015 in [facility] Counselor from 05/26/2015 in [place] Office Visit from 04/29/2015 in [facility] [place] Office Visit from 03/13/2015 in [employer]  PHQ-2 Total Score  2  6  6  5  6   PHQ-9 Total Score  10  24  25  23   No data       Assessment and Plan:  Brandy [name] continues to struggle with severe depressive symptoms and melancholic presentation.  She has continued to struggle with depressive symptoms for nearly 9 months, without any significant or sustained relief.  I have continued to recommend ECT treatment, and she is now open to this possibility.  We will proceed as below for treatment of bipolar depression and make consultation for ECT.  She does not have any thoughts of  self-harm, and is motivated and willing to engage in recommended treatment.  1. Bipolar depression ([place])   2. Severe episode of recurrent major depressive disorder, without psychotic features ([place])     Status of current problems: gradually worsening  Labs Ordered: Orders Placed This Encounter  Procedures  . Ambulatory referral to Psychiatry    Referral Priority:   Routine    Referral Type:   Psychiatric    Referral Reason:   Specialty Services Required    Referred to Provider:   Gonzella Lex, MD    Requested Specialty:   Psychiatry    Number of Visits Requested:   1    Labs Reviewed: n/a  Collateral Obtained/Records Reviewed: n/a  Plan:  Continue Lamictal 100 mg twice daily Continue Restoril nightly Discontinue Rexulti in favor of Latuda Latuda 20 mg daily for 1 week, then increase to 40 mg daily ECT consultation  I spent 20 minutes with the patient in direct face-to-face clinical care.  Greater than 50% of this time was spent in counseling and coordination of care with the patient.    Aundra Dubin, MD 05/15/2017, 10:24 AM

## 2017-05-15 NOTE — Patient Instructions (Signed)
STOP Rexulti (brexpiprazole)  START Latuda 20 mg daily, and then increase to 40 mg daily in 1 week as tolerated  Contact Dr. Weber Cooks regarding ECT treatment

## 2017-05-29 ENCOUNTER — Telehealth (HOSPITAL_COMMUNITY): Payer: Self-pay

## 2017-05-29 NOTE — Telephone Encounter (Signed)
Phamacy called inquiring about a Prior Authorization for a medication, she didn't state which medication it is. Please call [phone]

## 2017-07-13 ENCOUNTER — Ambulatory Visit (HOSPITAL_COMMUNITY): Payer: 59 | Admitting: Psychiatry

## 2017-09-08 ENCOUNTER — Telehealth: Payer: Self-pay | Admitting: Family Medicine

## 2017-09-08 ENCOUNTER — Other Ambulatory Visit: Payer: Self-pay | Admitting: Family Medicine

## 2017-09-08 DIAGNOSIS — I10 Essential (primary) hypertension: Secondary | ICD-10-CM

## 2017-09-08 DIAGNOSIS — R600 Localized edema: Secondary | ICD-10-CM

## 2017-09-08 MED ORDER — LOSARTAN POTASSIUM-HCTZ 100-25 MG PO TABS: 1.0000 | ORAL_TABLET | Freq: Every day | ORAL | 0 refills | Status: DC

## 2017-09-08 NOTE — Telephone Encounter (Signed)
Patient called requesting a refill on all her current medication. Patient has scheduled an appt. For 7/1 and is out of refills. Patient uses [facility]. Please f/u

## 2017-09-08 NOTE — Telephone Encounter (Signed)
Patient was called and informed of medication being refilled and sent to pharmacy. 

## 2017-09-08 NOTE — Telephone Encounter (Signed)
I have refilled Hyzaar.  Other refills will have to wait till her appointment

## 2017-09-08 NOTE — Telephone Encounter (Signed)
Patient needs Lasix and Hyzaar. Should I refill or wait until appointment on 7/1.

## 2017-09-11 ENCOUNTER — Encounter (HOSPITAL_COMMUNITY): Payer: Self-pay | Admitting: Psychology

## 2017-09-11 NOTE — Progress Notes (Signed)
Brandy [name] is a 52 y.o. female patient who is discharged from counseling as last seen on 01/16/18 and is no longer active w/ counseling.  Outpatient Therapist Discharge Summary  [name]    11-02-1965   Admission Date: 10/12/16   Discharge Date:  09/11/17 Reason for Discharge:  Not active Diagnosis:  Bipolar depression  Comments:  Pt will continue w/ dr. Daron Offer as scheduled  [facility], [facility]

## 2017-10-02 ENCOUNTER — Encounter: Payer: Self-pay | Admitting: Family Medicine

## 2017-10-02 ENCOUNTER — Ambulatory Visit: Payer: 59 | Attending: Family Medicine | Admitting: Licensed Clinical Social Worker

## 2017-10-02 ENCOUNTER — Ambulatory Visit: Payer: 59 | Attending: Family Medicine | Admitting: Family Medicine

## 2017-10-02 VITALS — BP 137/85 | HR 87 | Temp 98.3°F | Ht 61.0 in | Wt 179.4 lb

## 2017-10-02 DIAGNOSIS — Z881 Allergy status to other antibiotic agents status: Secondary | ICD-10-CM | POA: Insufficient documentation

## 2017-10-02 DIAGNOSIS — K219 Gastro-esophageal reflux disease without esophagitis: Secondary | ICD-10-CM | POA: Diagnosis not present

## 2017-10-02 DIAGNOSIS — F3162 Bipolar disorder, current episode mixed, moderate: Secondary | ICD-10-CM | POA: Diagnosis not present

## 2017-10-02 DIAGNOSIS — Z1211 Encounter for screening for malignant neoplasm of colon: Secondary | ICD-10-CM | POA: Diagnosis not present

## 2017-10-02 DIAGNOSIS — R600 Localized edema: Secondary | ICD-10-CM | POA: Insufficient documentation

## 2017-10-02 DIAGNOSIS — N3941 Urge incontinence: Secondary | ICD-10-CM | POA: Diagnosis not present

## 2017-10-02 DIAGNOSIS — F332 Major depressive disorder, recurrent severe without psychotic features: Secondary | ICD-10-CM

## 2017-10-02 DIAGNOSIS — R42 Dizziness and giddiness: Secondary | ICD-10-CM | POA: Diagnosis not present

## 2017-10-02 DIAGNOSIS — Z91041 Radiographic dye allergy status: Secondary | ICD-10-CM | POA: Diagnosis not present

## 2017-10-02 DIAGNOSIS — Z1231 Encounter for screening mammogram for malignant neoplasm of breast: Secondary | ICD-10-CM | POA: Diagnosis not present

## 2017-10-02 DIAGNOSIS — Z1239 Encounter for other screening for malignant neoplasm of breast: Secondary | ICD-10-CM

## 2017-10-02 DIAGNOSIS — Z79899 Other long term (current) drug therapy: Secondary | ICD-10-CM | POA: Insufficient documentation

## 2017-10-02 DIAGNOSIS — Z91013 Allergy to seafood: Secondary | ICD-10-CM | POA: Insufficient documentation

## 2017-10-02 DIAGNOSIS — Z7982 Long term (current) use of aspirin: Secondary | ICD-10-CM | POA: Insufficient documentation

## 2017-10-02 DIAGNOSIS — F419 Anxiety disorder, unspecified: Secondary | ICD-10-CM | POA: Diagnosis not present

## 2017-10-02 DIAGNOSIS — Z9889 Other specified postprocedural states: Secondary | ICD-10-CM | POA: Insufficient documentation

## 2017-10-02 DIAGNOSIS — I10 Essential (primary) hypertension: Secondary | ICD-10-CM | POA: Diagnosis not present

## 2017-10-02 DIAGNOSIS — M1990 Unspecified osteoarthritis, unspecified site: Secondary | ICD-10-CM | POA: Insufficient documentation

## 2017-10-02 MED ORDER — FUROSEMIDE 20 MG PO TABS: 20.0000 mg | ORAL_TABLET | Freq: Every day | ORAL | 1 refills | Status: DC

## 2017-10-02 MED ORDER — OXYBUTYNIN CHLORIDE 5 MG PO TABS: 5.0000 mg | ORAL_TABLET | Freq: Two times a day (BID) | ORAL | 3 refills | Status: DC

## 2017-10-02 MED ORDER — PANTOPRAZOLE SODIUM 40 MG PO TBEC: 40.0000 mg | DELAYED_RELEASE_TABLET | Freq: Every day | ORAL | 1 refills | Status: DC

## 2017-10-02 MED ORDER — LOSARTAN POTASSIUM-HCTZ 100-25 MG PO TABS: 1.0000 | ORAL_TABLET | Freq: Every day | ORAL | 1 refills | Status: DC

## 2017-10-02 MED ORDER — CARVEDILOL 25 MG PO TABS: 25.0000 mg | ORAL_TABLET | Freq: Two times a day (BID) | ORAL | 1 refills | Status: DC

## 2017-10-02 MED ORDER — POTASSIUM CHLORIDE CRYS ER 20 MEQ PO TBCR: 20.0000 meq | EXTENDED_RELEASE_TABLET | Freq: Every day | ORAL | 1 refills | Status: DC

## 2017-10-02 NOTE — Patient Instructions (Signed)

## 2017-10-02 NOTE — Progress Notes (Signed)
Subjective:  Patient ID: Brandy [name], female    DOB: [birth year]  Age: 52 y.o. MRN: [id number]  CC: Hypertension   HPI Brandy [name] is a 52 year old female with a history of hypertension, bipolar disorder, vertigo, GERD, history of pulmonary edema who comes into the clinic for a follow-up visit. She was last seen in the clinic 1 year ago but endorses compliance with her medications. Depression is uncontrolled and she endorses not picking up Sherrill which was prescribed by her psychiatrist - Dr. Daron Offer.  She was discharged from behavioral health counseling services due to missing her appointment and currently does not undergo any form of counseling.  She still lacks motivation but denies suicidal ideation or intent, she is afraid to drive; her daughter is a source of joy to her and causes her to want to live.  She describes an episode to me where " a white woman visited her home to discuss application for disability but was not dressed professionally as she appeared in shorts and flip-flops.  She sent the lady out of her home but continually feels this lady is following her".  States she reported this to her psychiatrist. She complains of urinary frequency and urge incontinence which she does not attribute to her Lasix as she would like to remain on it for pedal edema.  She describes her urinary symptoms as having to use the bathroom every 10 to 20 minutes.  Past Medical History:  Diagnosis Date  . Anxiety   . Depression   . Diverticulitis   . Edema   . Gallstones   . Hypertension   . Osteoarthritis     Past Surgical History:  Procedure Laterality Date  . GALLBLADDER SURGERY      Allergies  Allergen Reactions  . Azithromycin Other (See Comments)    "Stomach cramps"  . Contrast Media [Iodinated Diagnostic Agents] Hives  . 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 aspirin EC 81 MG EC tablet Take 1 tablet (81 mg total) by mouth daily.    Mar[place] Kitchen lurasidone (LATUDA) 40 MG TABS tablet Take 1 tablet (40 mg total) by mouth daily with breakfast. 90 tablet 1  . temazepam (RESTORIL) 15 MG capsule Take 1 capsule (15 mg total) at bedtime by mouth. Take 1-2 capsules at bedtime 60 capsule 2  . carvedilol (COREG) 25 MG tablet Take 1 tablet (25 mg total) by mouth 2 (two) times daily with a meal. 180 tablet 1  . furosemide (LASIX) 20 MG tablet TAKE 1 TABLET (20 MG TOTAL) BY MOUTH DAILY. 90 tablet 1  . losartan-hydrochlorothiazide (HYZAAR) 100-25 MG tablet Take 1 tablet by mouth daily. 30 tablet 0  . pantoprazole (PROTONIX) 40 MG tablet Take 1 tablet (40 mg total) by mouth daily. 90 tablet 1  . potassium chloride SA (K-DUR,KLOR-CON) 20 MEQ tablet Take 1 tablet (20 mEq total) by mouth daily. 90 tablet 1  . lamoTRIgine (LAMICTAL) 100 MG tablet Take 1 tablet (100 mg total) 2 (two) times daily by mouth. (Patient not taking: Reported on 10/02/2017) 180 tablet 1   No facility-administered medications prior to visit.     ROS Review of Systems  Constitutional: Negative for activity change, appetite change and fatigue.  HENT: Negative for congestion, sinus pressure and sore throat.   Eyes: Negative for visual disturbance.  Respiratory: Negative for cough, chest tightness, shortness of breath  and wheezing.   Cardiovascular: Negative for chest pain and palpitations.  Gastrointestinal: Negative for abdominal distention, abdominal pain and constipation.  Endocrine: Negative for polydipsia.  Genitourinary: Negative for dysuria and frequency.  Musculoskeletal: Negative for arthralgias and back pain.  Skin: Negative for rash.  Neurological: Negative for tremors, light-headedness and numbness.  Hematological: Does not bruise/bleed easily.  Psychiatric/Behavioral: Positive for dysphoric mood. Negative for agitation and behavioral problems.    Objective:  BP 137/85   [name] 87    Temp 98.3 F (36.8 C) (Oral)   Ht 5' 1"  (1.549 m)   Wt 179 lb 6.4 oz (81.4 kg)   SpO2 99%   BMI 33.90 kg/m   BP/Weight 10/02/2017 7/32/2025 07/04/7060  Systolic BP 376 283 151  Diastolic BP 85 81 76  Wt. (Lbs) 179.4 203 -  BMI 33.9 38.36 -  Some encounter information is confidential and restricted. Go to Review Flowsheets activity to see all data.      Physical Exam  Constitutional: She is oriented to person, place, and time. She appears well-developed and well-nourished.  Cardiovascular: Normal rate, normal heart sounds and intact distal pulses.  No murmur heard. Pulmonary/Chest: Effort normal and breath sounds normal. She has no wheezes. She has no rales. She exhibits no tenderness.  Abdominal: Soft. Bowel sounds are normal. She exhibits no distension and no mass. There is no tenderness.  Musculoskeletal: Normal range of motion.  Neurological: She is alert and oriented to person, place, and time.  Skin: Skin is warm and dry.  Psychiatric: She has a normal mood and affect.  Dysphoric mood     CMP Latest Ref Rng & Units 10/24/2016 10/08/2015 04/29/2015  Glucose 65 - 99 mg/dL 97 104(H) 100(H)  BUN 6 - 24 mg/dL 10 13 16   Creatinine 0.57 - 1.00 mg/dL 1.14(H) 0.99 0.93  Sodium 134 - 144 mmol/L 145(H) 136 138  Potassium 3.5 - 5.2 mmol/L 3.9 3.7 4.6  Chloride 96 - 106 mmol/L 103 106 100  CO2 20 - 29 mmol/L 26 26 26   Calcium 8.7 - 10.2 mg/dL 9.0 9.4 9.2  Total Protein 6.0 - 8.5 g/dL 7.2 6.7 -  Total Bilirubin 0.0 - 1.2 mg/dL 0.2 0.4 -  Alkaline Phos 39 - 117 IU/L 67 59 -  AST 0 - 40 IU/L 13 20 -  ALT 0 - 32 IU/L 13 17 -    Assessment & Plan:   1. Essential hypertension Controlled Counseled on blood pressure goal of less than 130/80, low-sodium, DASH diet, medication compliance, 150 minutes of moderate intensity exercise per week. Discussed medication compliance, adverse effects. - carvedilol (COREG) 25 MG tablet; Take 1 tablet (25 mg total) by mouth 2 (two) times daily with a  meal.  Dispense: 180 tablet; Refill: 1 - losartan-hydrochlorothiazide (HYZAAR) 100-25 MG tablet; Take 1 tablet by mouth daily.  Dispense: 90 tablet; Refill: 1 - CMP14+EGFR  2. Pedal edema Stable Elevate feet, use compression stockings Low-sodium diet - furosemide (LASIX) 20 MG tablet; Take 1 tablet (20 mg total) by mouth daily.  Dispense: 90 tablet; Refill: 1  3. Gastroesophageal reflux disease without esophagitis Controlled - pantoprazole (PROTONIX) 40 MG tablet; Take 1 tablet (40 mg total) by mouth daily.  Dispense: 90 tablet; Refill: 1  4. Bipolar disorder, current episode mixed, moderate ([place]) Uncontrolled She is yet to pick up Latuda from the pharmacy and has been advised to do so LCSW called in for counseling Advised to keep appointment with psychiatry at behavioral health  5. Screening  for breast cancer - MM Digital Screening; Future  6. Screening for colon cancer - Ambulatory referral to Gastroenterology  7. Urge incontinence Discussed [place] exercises - oxybutynin (DITROPAN) 5 MG tablet; Take 1 tablet (5 mg total) by mouth 2 (two) times daily.  Dispense: 60 tablet; Refill: 3   Meds ordered this encounter  Medications  . carvedilol (COREG) 25 MG tablet    Sig: Take 1 tablet (25 mg total) by mouth 2 (two) times daily with a meal.    Dispense:  180 tablet    Refill:  1  . furosemide (LASIX) 20 MG tablet    Sig: Take 1 tablet (20 mg total) by mouth daily.    Dispense:  90 tablet    Refill:  1  . losartan-hydrochlorothiazide (HYZAAR) 100-25 MG tablet    Sig: Take 1 tablet by mouth daily.    Dispense:  90 tablet    Refill:  1  . pantoprazole (PROTONIX) 40 MG tablet    Sig: Take 1 tablet (40 mg total) by mouth daily.    Dispense:  90 tablet    Refill:  1  . potassium chloride SA (K-DUR,KLOR-CON) 20 MEQ tablet    Sig: Take 1 tablet (20 mEq total) by mouth daily.    Dispense:  90 tablet    Refill:  1  . oxybutynin (DITROPAN) 5 MG tablet    Sig: Take 1 tablet (5  mg total) by mouth 2 (two) times daily.    Dispense:  60 tablet    Refill:  3    Follow-up: Return in about 3 months (around 01/02/2018) for follow up of chronic medical conditions.   Charlott Rakes MD

## 2017-10-03 ENCOUNTER — Other Ambulatory Visit: Payer: Self-pay | Admitting: Pharmacist

## 2017-10-03 ENCOUNTER — Encounter: Payer: Self-pay | Admitting: Family Medicine

## 2017-10-03 LAB — CMP14+EGFR
A/G RATIO: 1.3 (ref 1.2–2.2)
ALK PHOS: 78 IU/L (ref 39–117)
ALT: 17 IU/L (ref 0–32)
AST: 16 IU/L (ref 0–40)
Albumin: 4.3 g/dL (ref 3.5–5.5)
BUN/Creatinine Ratio: 14 (ref 9–23)
BUN: 16 mg/dL (ref 6–24)
CHLORIDE: 101 mmol/L (ref 96–106)
CO2: 28 mmol/L (ref 20–29)
Calcium: 10.3 mg/dL — ABNORMAL HIGH (ref 8.7–10.2)
Creatinine, Ser: 1.14 mg/dL — ABNORMAL HIGH (ref 0.57–1.00)
GFR calc non Af Amer: 55 mL/min/{1.73_m2} — ABNORMAL LOW (ref >59)
GFR, EST AFRICAN AMERICAN: 64 mL/min/{1.73_m2} (ref >59)
Globulin, Total: 3.4 g/dL (ref 1.5–4.5)
Glucose: 92 mg/dL (ref 65–99)
POTASSIUM: 4.2 mmol/L (ref 3.5–5.2)
Sodium: 140 mmol/L (ref 134–144)
Total Protein: 7.7 g/dL (ref 6.0–8.5)

## 2017-10-03 MED ORDER — POTASSIUM CHLORIDE 40 MEQ/15ML (20%) PO SOLN: 20.0000 meq | Freq: Every day | ORAL | 3 refills | Status: DC

## 2017-10-03 NOTE — Progress Notes (Signed)
Pt has orders for oxybutynin and PO potassium. Rec. fax from [facility] of this. Liquid potassium does not carry the GI irritation risk that PO solid potassium does. Sent in orders for oral liquid potassium after discussion with Dr. Margarita Rana.

## 2017-10-03 NOTE — BH Specialist Note (Signed)
Integrated Behavioral Health Initial Visit  MRN: [id number] Name: Brandy [name]  Number of [facility] Clinician visits:: 1/6 Session Start time: 3:00 PM  Session End time: 3:30 PM Total time: 30 minutes  Type of Service: [place] Interpretor:No. Interpretor Name and Language: N/A   Warm Hand Off Completed.       SUBJECTIVE: Brandy [name] is a 52 y.o. female accompanied by self Patient was referred by Dr. Margarita Rana for depression and anxiety. Patient reports the following symptoms/concerns: feelings of sadness and worry, difficulty sleeping, low energy, feeling bad about self, decreased concentration, irritability, decreased appetite, unintentional weight loss, panic attacks, and hx of suicidal ideations Duration of problem: Ongoing, Pt reports being diagnosed with Anxiety and Depression in 1999 and again in 2016 ; Severity of problem: severe  OBJECTIVE: Mood: Anxious and Depressed and Affect: Depressed Risk of harm to self or others: No plan to harm self or others  LIFE CONTEXT: Family and Social: Pt receives strong support from family. She has three children (77 yo, 8 yo, 77 yo) School/Work: Pt is employed Self-Care: Pt has a Teacher, music through [employer], Dr. Daron Offer. She was seeing a therapist, Jettie Booze, however has missed multiple appointments resulting in dismissal. Life Changes: Pt has difficulty managing mental health  GOALS ADDRESSED: Patient will: 1. Reduce symptoms of: anxiety and depression 2. Increase knowledge and/or ability of: coping skills  3. Demonstrate ability to: Increase adequate support systems for patient/family and Improve medication compliance  INTERVENTIONS: Interventions utilized: Motivational Interviewing, Psychoeducation and/or Health Education and Link to [employer]  Standardized Assessments completed: GAD-7 and PHQ 2&9 with C-SSRS  ASSESSMENT: Patient currently  experiencing depression and anxiety. She reports feelings of sadness and worry, difficulty sleeping, low energy, feeling bad about self, decreased concentration, irritability, decreased appetite, unintentional weight loss, panic attacks, and hx of suicidal ideations. LCSWA inquired about protective factors, to which pt shared are her children. Safety plan was discussed and crisis intervention resources were provided.    Patient has a psychiatrist through [facility], Dr. Daron Offer. She was seeing a therapist, Jettie Booze, however has missed multiple appointments resulting in dismissal. LCSWA discussed how pt can obtain a new therapist and strongly encouraged pt to contact this [facility] if any additional assistance is needed. Pt verbalized understanding. [place] educated pt on correlation between one's physical and medical health. Grounding strategies were introduced to pt in effort to assist with improving mood and decreasing symptoms. Pt plans on picking up Latuda prescription at her next scheduled appointment this month with Dr. Daron Offer.   PLAN: 1. Follow up with behavioral health clinician on : Pt was encouraged to contact LCSWA if symptoms worsen or fail to improve to schedule behavioral appointments at [facility]. 2. Behavioral recommendations: LCSWA recommends that pt apply healthy coping skills discussed. Pt is encouraged to schedule follow up appointment with LCSWA 3. Referral(s): [place] (In Clinic) and [place] (LME/Outside Clinic) 4. "From scale of 1-10, how likely are you to follow plan?": 7/10  Rebekah Chesterfield, LCSW 10/04/17 12:03 PM

## 2017-10-06 ENCOUNTER — Telehealth: Payer: Self-pay

## 2017-10-06 ENCOUNTER — Other Ambulatory Visit: Payer: Self-pay | Admitting: Family Medicine

## 2017-10-06 DIAGNOSIS — Z1231 Encounter for screening mammogram for malignant neoplasm of breast: Secondary | ICD-10-CM

## 2017-10-06 NOTE — Telephone Encounter (Signed)
Patient was called and informed of lab results. 

## 2017-10-13 ENCOUNTER — Ambulatory Visit: Payer: Self-pay

## 2017-10-24 ENCOUNTER — Encounter (HOSPITAL_COMMUNITY): Payer: Self-pay | Admitting: Psychiatry

## 2017-10-24 ENCOUNTER — Ambulatory Visit (INDEPENDENT_AMBULATORY_CARE_PROVIDER_SITE_OTHER): Payer: 59 | Admitting: Psychiatry

## 2017-10-24 DIAGNOSIS — F319 Bipolar disorder, unspecified: Secondary | ICD-10-CM | POA: Diagnosis not present

## 2017-10-24 MED ORDER — LURASIDONE HCL 40 MG PO TABS: 40.0000 mg | ORAL_TABLET | Freq: Every day | ORAL | 1 refills | Status: DC

## 2017-10-24 MED ORDER — TEMAZEPAM 15 MG PO CAPS: 15.0000 mg | ORAL_CAPSULE | Freq: Every day | ORAL | 2 refills | Status: DC

## 2017-10-24 MED ORDER — LAMOTRIGINE 100 MG PO TABS
100.0000 mg | ORAL_TABLET | Freq: Two times a day (BID) | ORAL | 1 refills | Status: DC
Start: 2017-10-24 — End: 2018-02-26

## 2017-10-24 NOTE — Progress Notes (Signed)
[place] MD/PA/NP OP Progress Note  10/24/2017 11:16 AM Brandy [name]  MRN:  [id number]  Chief Complaint: depressed  HPI: Brandy [name] presents for follow-up after 5 months without being seen.  She never picked up the [facility] and reports that she forgot.  She spent time continuing the same reporting complaints that she had at her last visit.  I reiterated the plan of introducing Latuda and she was receptive to this.  We will follow-up with Dr. Adele [name] or Dr. Casimiro [name] in 3 months.  Disclosed to patient that this Probation officer is leaving this practice at the end of August 2019, and patients always has the right to choose their provider. Reassured patient that office will work to provide smooth transition of care whether they wish to remain at this office, or to continue with this provider, or seek alternative care options in community.  They expressed understanding.   Visit Diagnosis:    ICD-10-CM   1. Bipolar depression ([place]) F31.9 lurasidone (LATUDA) 40 MG TABS tablet    temazepam (RESTORIL) 15 MG capsule    lamoTRIgine (LAMICTAL) 100 MG tablet    Ambulatory referral to Psychology    Past Psychiatric History: See intake H&P for full details. Reviewed, with no updates at this time.   Past Medical History:  Past Medical History:  Diagnosis Date  . Anxiety   . Depression   . Diverticulitis   . Edema   . Gallstones   . Hypertension   . Osteoarthritis     Past Surgical History:  Procedure Laterality Date  . GALLBLADDER SURGERY      Family Psychiatric History: See intake H&P for full details. Reviewed, with no updates at this time.   Family History:  Family History  Problem Relation Age of Onset  . Alcohol abuse Mother   . Schizophrenia Maternal Grandmother   . Coronary artery disease Paternal Grandmother        young age  . Diabetes Paternal Grandmother   . Hypertension Father   . Diabetes Sister   . Schizophrenia Maternal Aunt     Social History:  Social History    Socioeconomic History  . Marital status: Married    Spouse name: Not on file  . Number of children: 3  . Years of education: Not on file  . Highest education level: Not on file  Occupational History  . Occupation: Research scientist (life sciences): [employer]  . Financial resource strain: Not on file  . Food insecurity:    Worry: Not on file    Inability: Not on file  . Transportation needs:    Medical: Not on file    Non-medical: Not on file  Tobacco Use  . Smoking status: Current Every Day Smoker    Packs/day: 0.50    Years: 1.50    Pack years: 0.75    Types: Cigarettes  . Smokeless tobacco: Never Used  Substance and Sexual Activity  . Alcohol use: Yes    Alcohol/week: 1.2 oz    Types: 1 Glasses of wine, 1 Cans of beer per week    Comment: occasionaly  . Drug use: Yes    Types: Marijuana    Comment: stopped a couple of weeks ago  . Sexual activity: Yes    Partners: Male    Birth control/protection: None  Lifestyle  . Physical activity:    Days per week: Not on file    Minutes per session: Not on file  . Stress: Not  on file  Relationships  . Social connections:    Talks on phone: Not on file    Gets together: Not on file    Attends religious service: Not on file    Active member of club or organization: Not on file    Attends meetings of clubs or organizations: Not on file    Relationship status: Not on file  Other Topics Concern  . Not on file  Social History Narrative  . Not on file    Allergies:  Allergies  Allergen Reactions  . Azithromycin Other (See Comments)    "Stomach cramps"  . Contrast Media [Iodinated Diagnostic Agents] Hives  . 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"    Metabolic Disorder Labs: Lab Results  Component Value Date   HGBA1C 5.70 04/29/2015   MPG 117 02/04/2015   No results found for: PROLACTIN Lab Results  Component Value Date   CHOL 157 10/24/2016   TRIG 117 10/24/2016   HDL  47 10/24/2016   CHOLHDL 3.3 10/24/2016   VLDL 24 02/03/2015   LDLCALC 87 10/24/2016   LDLCALC 96 02/03/2015   Lab Results  Component Value Date   TSH 1.290 10/24/2016   TSH 1.942 02/02/2015    Therapeutic Level Labs: No results found for: LITHIUM No results found for: VALPROATE No components found for:  CBMZ  Current Medications: Current Outpatient Medications  Medication Sig Dispense Refill  . aspirin EC 81 MG EC tablet Take 1 tablet (81 mg total) by mouth daily.    . carvedilol (COREG) 25 MG tablet Take 1 tablet (25 mg total) by mouth 2 (two) times daily with a meal. 180 tablet 1  . furosemide (LASIX) 20 MG tablet Take 1 tablet (20 mg total) by mouth daily. 90 tablet 1  . lamoTRIgine (LAMICTAL) 100 MG tablet Take 1 tablet (100 mg total) by mouth 2 (two) times daily. 180 tablet 1  . losartan-hydrochlorothiazide (HYZAAR) 100-25 MG tablet Take 1 tablet by mouth daily. 90 tablet 1  . lurasidone (LATUDA) 40 MG TABS tablet Take 1 tablet (40 mg total) by mouth daily with breakfast. 90 tablet 1  . oxybutynin (DITROPAN) 5 MG tablet Take 1 tablet (5 mg total) by mouth 2 (two) times daily. 60 tablet 3  . pantoprazole (PROTONIX) 40 MG tablet Take 1 tablet (40 mg total) by mouth daily. 90 tablet 1  . Potassium Chloride 40 MEQ/15ML (20%) SOLN Take 20 mEq by mouth daily. 225 mL 3  . temazepam (RESTORIL) 15 MG capsule Take 1 capsule (15 mg total) by mouth at bedtime. Take 1-2 capsules at bedtime 60 capsule 2   No current facility-administered medications for this visit.      Musculoskeletal: Strength & Muscle Tone: within normal limits Gait & Station: normal Patient leans: N/A  Psychiatric Specialty Exam: ROS  Blood pressure 128/82, [name] 80, height 5\' 1"  (1.549 m), weight 181 lb 9.6 oz (82.4 kg), SpO2 95 %.Body mass index is 34.31 kg/m.  General Appearance: Casual and Fairly Groomed  Eye Contact:  Fair  Speech:  Normal Rate  Volume:  Decreased  Mood:  Depressed and Dysphoric   Affect:  Congruent, Depressed and Flat  Thought Process:  Goal Directed and Descriptions of Associations: Intact  Orientation:  Full (Time, Place, and Person)  Thought Content: Logical   Suicidal Thoughts:  No  Homicidal Thoughts:  No  Memory:  Immediate;   Fair  Judgement:  Fair  Insight:  Fair  Psychomotor Activity:  Decreased  Concentration:  Concentration: Fair  Recall:  [employer] of Knowledge: Fair  Language: Fair  Akathisia:  Negative  Handed:  Right  AIMS (if indicated): 0/10  Assets:  Communication Skills Desire for Improvement Financial Resources/Insurance Housing Social Support Transportation Vocational/Educational  ADL's:  Intact  Cognition: WNL  Sleep:  Good   Screenings: AIMS     Office Visit from 05/15/2017 in [facility]  AIMS Total Score  0    GAD-7     Office Visit from 10/02/2017 in [place] Office Visit from 10/24/2016 in [place] Office Visit from 11/16/2015 in [place] Office Visit from 04/29/2015 in [place] Office Visit from 02/13/2015 in [place]  Total GAD-7 Score  20  8  20  20  19     PHQ2-9     Office Visit from 10/02/2017 in [place] Office Visit from 10/24/2016 in [place] Office Visit from 11/16/2015 in [place] Counselor from 05/26/2015 in [place] Office Visit from 04/29/2015 in [place]  PHQ-2 Total Score  6  2  6  6  5   PHQ-9 Total Score  24  10  24  25  23        Assessment and Plan:  Brandy [name] presents with no significant change in her mood or depressive symptoms, she did not start the [facility] because she forgot to pick it up.  She presents after 5 months without being seen and was  terminated from the clinic for not showing up for therapy visits.  I reiterated the importance of participation in care and she was receptive to this.  Provided a referral for individual therapy within the [facility] network and will follow-up at this office in 2-3 months.  1. Bipolar depression ([place])     Status of current problems: gradually worsening  Labs Ordered: Orders Placed This Encounter  Procedures  . Ambulatory referral to Psychology    Referral Priority:   Routine    Referral Type:   Psychiatric    Referral Reason:   Specialty Services Required    Referred to Provider:   Royetta Crochet    Requested Specialty:   Psychology    Number of Visits Requested:   1    Labs Reviewed: n/a  Collateral Obtained/Records Reviewed: n/a  Plan:  Continue Lamictal 100 mg twice daily Continue Restoril nightly 15-30 mg Latuda 40 mg daily ECT consultation;  Patient declined  Aundra Dubin, MD 10/24/2017, 11:16 AM

## 2017-11-03 ENCOUNTER — Ambulatory Visit
Admission: RE | Admit: 2017-11-03 | Discharge: 2017-11-03 | Disposition: A | Discharge status: Home / Self Care | Payer: 59 | Source: Ambulatory Visit | Attending: Family Medicine | Admitting: Family Medicine

## 2017-11-03 DIAGNOSIS — Z1231 Encounter for screening mammogram for malignant neoplasm of breast: Secondary | ICD-10-CM

## 2017-11-06 ENCOUNTER — Other Ambulatory Visit: Payer: Self-pay | Admitting: Family Medicine

## 2017-11-06 DIAGNOSIS — R928 Other abnormal and inconclusive findings on diagnostic imaging of breast: Secondary | ICD-10-CM

## 2017-11-09 ENCOUNTER — Other Ambulatory Visit: Payer: Self-pay

## 2017-11-15 ENCOUNTER — Ambulatory Visit (INDEPENDENT_AMBULATORY_CARE_PROVIDER_SITE_OTHER): Payer: 59 | Admitting: Psychology

## 2017-11-15 DIAGNOSIS — F3189 Other bipolar disorder: Secondary | ICD-10-CM | POA: Diagnosis not present

## 2017-11-16 ENCOUNTER — Ambulatory Visit: Payer: Self-pay

## 2017-11-16 ENCOUNTER — Ambulatory Visit
Admission: RE | Admit: 2017-11-16 | Discharge: 2017-11-16 | Disposition: A | Discharge status: Home / Self Care | Payer: 59 | Source: Ambulatory Visit | Attending: Family Medicine | Admitting: Family Medicine

## 2017-11-16 DIAGNOSIS — R928 Other abnormal and inconclusive findings on diagnostic imaging of breast: Secondary | ICD-10-CM

## 2017-11-20 ENCOUNTER — Telehealth (INDEPENDENT_AMBULATORY_CARE_PROVIDER_SITE_OTHER): Payer: Self-pay

## 2017-11-20 NOTE — Telephone Encounter (Signed)
-----   Message from Charlott Rakes, MD sent at 11/16/2017  1:37 PM EDT ----- Diagnostic Mammogram is negative for malignancy.

## 2017-11-20 NOTE — Telephone Encounter (Signed)
Patient was called and informed of lab results. 

## 2017-11-29 ENCOUNTER — Ambulatory Visit (INDEPENDENT_AMBULATORY_CARE_PROVIDER_SITE_OTHER): Payer: 59 | Admitting: Psychiatry

## 2017-11-29 ENCOUNTER — Encounter (HOSPITAL_COMMUNITY): Payer: Self-pay | Admitting: Psychiatry

## 2017-11-29 DIAGNOSIS — F319 Bipolar disorder, unspecified: Secondary | ICD-10-CM | POA: Diagnosis not present

## 2017-11-29 MED ORDER — LURASIDONE HCL 80 MG PO TABS: 80.0000 mg | ORAL_TABLET | Freq: Every day | ORAL | 0 refills | Status: DC

## 2017-11-29 NOTE — Progress Notes (Signed)
[facility] MD/PA/NP OP Progress Note  11/29/2017 10:20 AM Brandy [name]  MRN:  [id number]  Chief Complaint: depressed  HPI: Brandy [name]'s with low mood, irritability, passive thoughts about death and dying.  She does feel a little bit better on the Latuda and we agreed to increase to 80 mg and I reiterated the risks and benefits of antipsychotics, including tardive dyskinesia.  I encouraged the patient to reach out to [name] if she notices any abnormal movements of her body or mouth.    She has been showing up for work consistently, but reports that she continues to feel irritable, paranoid, does not want to be bothered by her husband or daughter.  She is quite apprehensive about ECT/TMS, so I suggested that she may benefit from being assessed for intranasal esketamine.  Provided her the information for Dr. Starleen Arms office to have this assessment done.  She will follow-up in 8 weeks with Dr. Doyne Keel.  Continuing otherwise on Lamictal and Restoril at the current doses, and increasing Latuda to 80 mg.  Visit Diagnosis:    ICD-10-CM   1. Bipolar depression ([place]) F31.9 lurasidone (LATUDA) 80 MG TABS tablet    Past Psychiatric History: See intake H&P for full details. Reviewed, with no updates at this time.   Past Medical History:  Past Medical History:  Diagnosis Date  . Anxiety   . Depression   . Diverticulitis   . Edema   . Gallstones   . Hypertension   . Osteoarthritis     Past Surgical History:  Procedure Laterality Date  . GALLBLADDER SURGERY      Family Psychiatric History: See intake H&P for full details. Reviewed, with no updates at this time.   Family History:  Family History  Problem Relation Age of Onset  . Alcohol abuse Mother   . Schizophrenia Maternal Grandmother   . Coronary artery disease Paternal Grandmother        young age  . Diabetes Paternal Grandmother   . Hypertension Father   . Diabetes Sister   . Schizophrenia Maternal Aunt     Social  History:  Social History   Socioeconomic History  . Marital status: Married    Spouse name: Not on file  . Number of children: 3  . Years of education: Not on file  . Highest education level: Not on file  Occupational History  . Occupation: Research scientist (life sciences): [employer]  . Financial resource strain: Not on file  . Food insecurity:    Worry: Not on file    Inability: Not on file  . Transportation needs:    Medical: Not on file    Non-medical: Not on file  Tobacco Use  . Smoking status: Current Every Day Smoker    Packs/day: 0.50    Years: 1.50    Pack years: 0.75    Types: Cigarettes  . Smokeless tobacco: Never Used  Substance and Sexual Activity  . Alcohol use: Yes    Alcohol/week: 2.0 standard drinks    Types: 1 Glasses of wine, 1 Cans of beer per week    Comment: occasionaly  . Drug use: Yes    Types: Marijuana    Comment: stopped a couple of weeks ago  . Sexual activity: Yes    Partners: Male    Birth control/protection: None  Lifestyle  . Physical activity:    Days per week: Not on file    Minutes per session: Not on file  .  Stress: Not on file  Relationships  . Social connections:    Talks on phone: Not on file    Gets together: Not on file    Attends religious service: Not on file    Active member of club or organization: Not on file    Attends meetings of clubs or organizations: Not on file    Relationship status: Not on file  Other Topics Concern  . Not on file  Social History Narrative  . Not on file    Allergies:  Allergies  Allergen Reactions  . Azithromycin Other (See Comments)    "Stomach cramps"  . Contrast Media [Iodinated Diagnostic Agents] Hives  . 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"    Metabolic Disorder Labs: Lab Results  Component Value Date   HGBA1C 5.70 04/29/2015   MPG 117 02/04/2015   No results found for: PROLACTIN Lab Results  Component Value Date   CHOL 157  10/24/2016   TRIG 117 10/24/2016   HDL 47 10/24/2016   CHOLHDL 3.3 10/24/2016   VLDL 24 02/03/2015   LDLCALC 87 10/24/2016   LDLCALC 96 02/03/2015   Lab Results  Component Value Date   TSH 1.290 10/24/2016   TSH 1.942 02/02/2015    Therapeutic Level Labs: No results found for: LITHIUM No results found for: VALPROATE No components found for:  CBMZ  Current Medications: Current Outpatient Medications  Medication Sig Dispense Refill  . aspirin EC 81 MG EC tablet Take 1 tablet (81 mg total) by mouth daily.    . carvedilol (COREG) 25 MG tablet Take 1 tablet (25 mg total) by mouth 2 (two) times daily with a meal. 180 tablet 1  . furosemide (LASIX) 20 MG tablet Take 1 tablet (20 mg total) by mouth daily. 90 tablet 1  . lamoTRIgine (LAMICTAL) 100 MG tablet Take 1 tablet (100 mg total) by mouth 2 (two) times daily. 180 tablet 1  . losartan-hydrochlorothiazide (HYZAAR) 100-25 MG tablet Take 1 tablet by mouth daily. 90 tablet 1  . lurasidone (LATUDA) 80 MG TABS tablet Take 1 tablet (80 mg total) by mouth daily with breakfast. 90 tablet 0  . oxybutynin (DITROPAN) 5 MG tablet Take 1 tablet (5 mg total) by mouth 2 (two) times daily. 60 tablet 3  . pantoprazole (PROTONIX) 40 MG tablet Take 1 tablet (40 mg total) by mouth daily. 90 tablet 1  . Potassium Chloride 40 MEQ/15ML (20%) SOLN Take 20 mEq by mouth daily. 225 mL 3  . temazepam (RESTORIL) 15 MG capsule Take 1 capsule (15 mg total) by mouth at bedtime. Take 1-2 capsules at bedtime 60 capsule 2   No current facility-administered medications for this visit.      Musculoskeletal: Strength & Muscle Tone: within normal limits Gait & Station: normal Patient leans: N/A  Psychiatric Specialty Exam: ROS  Blood pressure 121/76, pulse 78, height 5\' 1"  (1.549 m), weight 185 lb (83.9 kg), last menstrual period 11/01/2017, SpO2 99 %.Body mass index is 34.96 kg/m.  General Appearance: Casual and Fairly Groomed  Eye Contact:  Poor  Speech:   Normal Rate  Volume:  Decreased  Mood:  Depressed, Dysphoric and Irritable  Affect:  Congruent and Depressed  Thought Process:  Goal Directed and Descriptions of Associations: Intact  Orientation:  Full (Time, Place, and Person)  Thought Content: Logical   Suicidal Thoughts:  No  Homicidal Thoughts:  No  Memory:  Immediate;   Fair  Judgement:  Fair  Insight:  Fair  Psychomotor Activity:  Decreased  Concentration:  Concentration: Fair  Recall:  [employer] of Knowledge: Fair  Language: Fair  Akathisia:  Negative  Handed:  Right  AIMS (if indicated): 0/10  Assets:  Communication Skills Desire for Improvement Financial Resources/Insurance Housing Social Support Transportation Vocational/Educational  ADL's:  Intact  Cognition: WNL  Sleep:  Fair   Screenings: AIMS     Office Visit from 05/15/2017 in [facility]  AIMS Total Score  0    GAD-7     Office Visit from 10/02/2017 in [place] Office Visit from 10/24/2016 in [place] Office Visit from 11/16/2015 in [place] Office Visit from 04/29/2015 in [place] Office Visit from 02/13/2015 in [place]  Total GAD-7 Score  20  8  20  20  19     PHQ2-9     Office Visit from 10/02/2017 in [place] Office Visit from 10/24/2016 in [place] Office Visit from 11/16/2015 in [place] Counselor from 05/26/2015 in [place] Office Visit from 04/29/2015 in [place]  PHQ-2 Total Score  6  2  6  6  5   PHQ-9 Total Score  24  10  24  25  23        Assessment and Plan:  COCO SHARPNACK presents with ongoing depressive symptoms.  She is on Lamictal and we agreed to increase Latuda further as below, to  target irritability, paranoia, and ongoing bipolar depression.  Patient was reluctant to proceed with electroconvulsive therapy and given high suspicion of bipolar depression, she would not be appropriate for [place].  I suggested that she may benefit from being assessed for intranasal ketamine and she was receptive to this.  I provided her the information for Dr. Starleen Arms clinic locally.  She will follow-up in her care at this office in 8 weeks with Dr. Doyne Keel for a transition of care.  Spent time with patient exploring her participation in therapy which continues to be very poor.  She has gone for 1 visit and never called back to reschedule.  1. Bipolar depression ([place])     Status of current problems: gradually worsening  Labs Ordered: No orders of the defined types were placed in this encounter.   Labs Reviewed: n/a  Collateral Obtained/Records Reviewed: n/a  Plan:  Continue Lamictal 100 mg twice daily Continue Restoril nightly 15-30 mg Increase Latuda to 80 mg daily ECT consultation;  Patient declined [place]; patient not appropriate given suspicion of bipolar depression Possible candidate for intranasal esketamine   Aundra Dubin, MD 11/29/2017, 10:20 AM

## 2018-01-02 ENCOUNTER — Ambulatory Visit: Payer: Self-pay | Admitting: Family Medicine

## 2018-01-18 ENCOUNTER — Ambulatory Visit (HOSPITAL_COMMUNITY): Payer: Self-pay | Admitting: Psychiatry

## 2018-01-25 ENCOUNTER — Ambulatory Visit: Payer: Self-pay | Admitting: Family Medicine

## 2018-02-07 ENCOUNTER — Ambulatory Visit: Payer: Self-pay | Admitting: Family Medicine

## 2018-02-26 ENCOUNTER — Ambulatory Visit (INDEPENDENT_AMBULATORY_CARE_PROVIDER_SITE_OTHER): Payer: 59 | Admitting: Psychiatry

## 2018-02-26 ENCOUNTER — Encounter (HOSPITAL_COMMUNITY): Payer: Self-pay | Admitting: Psychiatry

## 2018-02-26 VITALS — BP 172/120 | HR 92 | Ht 61.0 in | Wt 181.0 lb

## 2018-02-26 DIAGNOSIS — F331 Major depressive disorder, recurrent, moderate: Secondary | ICD-10-CM

## 2018-02-26 DIAGNOSIS — F319 Bipolar disorder, unspecified: Secondary | ICD-10-CM

## 2018-02-26 MED ORDER — TEMAZEPAM 15 MG PO CAPS: 15.0000 mg | ORAL_CAPSULE | Freq: Every day | ORAL | 2 refills | Status: DC

## 2018-02-26 MED ORDER — LAMOTRIGINE 100 MG PO TABS: 100.0000 mg | ORAL_TABLET | Freq: Two times a day (BID) | ORAL | 0 refills | Status: DC

## 2018-02-26 MED ORDER — LURASIDONE HCL 80 MG PO TABS: 80.0000 mg | ORAL_TABLET | Freq: Every day | ORAL | 0 refills | Status: DC

## 2018-02-26 MED ORDER — DULOXETINE HCL 20 MG PO CPEP: 20.0000 mg | ORAL_CAPSULE | Freq: Every day | ORAL | 2 refills | Status: DC

## 2018-02-26 NOTE — Progress Notes (Signed)
[place] MD/PA/NP OP Progress Note  02/26/2018 4:00 PM Brandy [name]  MRN:  [id number]  Chief Complaint: I am depressed.  I have no energy.  I do not leave my house.  HPI: Brandy [name] is a 52 year old African-American female who has been seeing in this office for past few years.  Patient diagnosed with bipolar disorder and major depressive disorder.  She was seeing Dr. Daron Offer who had left the practice.  She was prescribed Latuda 80 mg daily, temazepam 50 mg at bedtime and Lamictal 100 mg twice a day.  On her last visit Latuda was increased to 80 mg.  She noticed some improvement in her paranoia but continued to have crying spells, chronic feeling of sadness, depression and irritable mood.  She feels that she is burden to her family because no one listen to her.  She admitted feeling of hopelessness and worthlessness but denies any active suicidal thoughts.  She thinks about death and dying but denies any hallucination or any active suicidal thoughts.  She does not leave the house because of paranoia.  She ruminates about her chronic symptoms.  She feels none of the medicine works.  She was recommended ECT but she is scared and reluctant to do that.  Apparently she is not appropriate for [place].  She used to see Jan Fireman but unclear why she stopped therapy.  [facility]  In the past she had tried Rexulti, Klonopin, Abilify, Lexapro, Zoloft, Wellbutrin, Risperdal, Prozac but do not remember the details.  On her last visit she was recommended to see Dr Toy Care for intranasal ketamine treatment assessment.  But she do not have recollection for that recommendation.  She lives with her husband and daughter.  She has no tremors shakes or any EPS.  She feel her depression is very bad because she has no energy, crying spells, lack of motivation to do things.  She denies drinking or using any illegal substances.  Her blood pressure was very high and she has  headaches but no chest pain, dizziness or any shortness of breath.  Patient has chronic hypertension and she is aware about it and patient told that she take some time extra pill for hypertension when her blood pressure is high.  She does not feel that she need to see physician or urgent care because she knows her illness very well.  Visit Diagnosis:    ICD-10-CM   1. MDD (major depressive disorder), recurrent episode, moderate (HCC) F33.1 DULoxetine (CYMBALTA) 20 MG capsule  2. Bipolar depression (HCC) F31.9 temazepam (RESTORIL) 15 MG capsule    lurasidone (LATUDA) 80 MG TABS tablet    lamoTRIgine (LAMICTAL) 100 MG tablet    Past Psychiatric History: Reviewed. Hospitalized in 07/08/1995 after her mother died.  She was treated at [facility] from [id number]-Apr-2000.  She saw Dr. Gildardo Griffes and did IOP in 07-08-15.  No history of suicidal attempt.  History of paranoia and diagnosed with bipolar daughter and major depression.  Had tried Zoloft, Wellbutrin, Rexulti, Klonopin, Abilify, Lexapro, Risperdal and Seroquel.   Past Medical History:  Past Medical History:  Diagnosis Date  . Anxiety   . Depression   . Diverticulitis   . Edema   . Gallstones   . Hypertension   . Osteoarthritis     Past Surgical History:  Procedure Laterality Date  . GALLBLADDER SURGERY      Family Psychiatric History: Reviewed.  Family History:  Family History  Problem Relation  Age of Onset  . Alcohol abuse Mother   . Schizophrenia Maternal Grandmother   . Coronary artery disease Paternal Grandmother        young age  . Diabetes Paternal Grandmother   . Hypertension Father   . Diabetes Sister   . Schizophrenia Maternal Aunt     Social History:  Social History   Socioeconomic History  . Marital status: Married    Spouse name: Not on file  . Number of children: 3  . Years of education: Not on file  . Highest education level: Not on file  Occupational History  . Occupation: Research scientist (life sciences): [employer]  . Financial resource strain: Not on file  . Food insecurity:    Worry: Not on file    Inability: Not on file  . Transportation needs:    Medical: Not on file    Non-medical: Not on file  Tobacco Use  . Smoking status: Current Every Day Smoker    Packs/day: 0.50    Years: 1.50    Pack years: 0.75    Types: Cigarettes  . Smokeless tobacco: Never Used  Substance and Sexual Activity  . Alcohol use: Yes    Alcohol/week: 2.0 standard drinks    Types: 1 Glasses of wine, 1 Cans of beer per week    Comment: occasionaly  . Drug use: Yes    Types: Marijuana    Comment: stopped a couple of weeks ago  . Sexual activity: Yes    Partners: Male    Birth control/protection: None  Lifestyle  . Physical activity:    Days per week: Not on file    Minutes per session: Not on file  . Stress: Not on file  Relationships  . Social connections:    Talks on phone: Not on file    Gets together: Not on file    Attends religious service: Not on file    Active member of club or organization: Not on file    Attends meetings of clubs or organizations: Not on file    Relationship status: Not on file  Other Topics Concern  . Not on file  Social History Narrative  . Not on file    Allergies:  Allergies  Allergen Reactions  . Azithromycin Other (See Comments)    "Stomach cramps"  . Contrast Media [Iodinated Diagnostic Agents] Hives  . 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"    Metabolic Disorder Labs: Lab Results  Component Value Date   HGBA1C 5.70 04/29/2015   MPG 117 02/04/2015   No results found for: PROLACTIN Lab Results  Component Value Date   CHOL 157 10/24/2016   TRIG 117 10/24/2016   HDL 47 10/24/2016   CHOLHDL 3.3 10/24/2016   VLDL 24 02/03/2015   LDLCALC 87 10/24/2016   LDLCALC 96 02/03/2015   Lab Results  Component Value Date   TSH 1.290 10/24/2016   TSH 1.942 02/02/2015    Therapeutic Level Labs: No results found for:  LITHIUM No results found for: VALPROATE No components found for:  CBMZ  Current Medications: Current Outpatient Medications  Medication Sig Dispense Refill  . aspirin EC 81 MG EC tablet Take 1 tablet (81 mg total) by mouth daily.    . carvedilol (COREG) 25 MG tablet Take 1 tablet (25 mg total) by mouth 2 (two) times daily with a meal. 180 tablet 1  . furosemide (LASIX) 20 MG tablet  Take 1 tablet (20 mg total) by mouth daily. 90 tablet 1  . lamoTRIgine (LAMICTAL) 100 MG tablet Take 1 tablet (100 mg total) by mouth 2 (two) times daily. 180 tablet 0  . losartan-hydrochlorothiazide (HYZAAR) 100-25 MG tablet Take 1 tablet by mouth daily. 90 tablet 1  . lurasidone (LATUDA) 80 MG TABS tablet Take 1 tablet (80 mg total) by mouth daily with breakfast. 90 tablet 0  . oxybutynin (DITROPAN) 5 MG tablet Take 1 tablet (5 mg total) by mouth 2 (two) times daily. 60 tablet 3  . pantoprazole (PROTONIX) 40 MG tablet Take 1 tablet (40 mg total) by mouth daily. 90 tablet 1  . Potassium Chloride 40 MEQ/15ML (20%) SOLN Take 20 mEq by mouth daily. 225 mL 3  . temazepam (RESTORIL) 15 MG capsule Take 1 capsule (15 mg total) by mouth at bedtime. 30 capsule 2  . DULoxetine (CYMBALTA) 20 MG capsule Take 1 capsule (20 mg total) by mouth daily. Take one capsule daily for 1 week and than twice daily 60 capsule 2   No current facility-administered medications for this visit.      Musculoskeletal: Strength & Muscle Tone: within normal limits Gait & Station: normal Patient leans: N/A  Psychiatric Specialty Exam: ROS  Blood pressure (!) 172/120, pulse 92, height 5\' 1"  (1.549 m), weight 181 lb (82.1 kg), SpO2 97 %.Body mass index is 34.2 kg/m.  General Appearance: Fairly Groomed  Eye Contact:  Fair  Speech:  Slow  Volume:  Decreased  Mood:  Depressed, Dysphoric and Irritable  Affect:  Congruent  Thought Process:  Descriptions of Associations: Intact  Orientation:  Full (Time, Place, and Person)  Thought Content:  Paranoid Ideation and Rumination   Suicidal Thoughts:  No  Homicidal Thoughts:  No  Memory:  Immediate;   Fair Recent;   Fair Remote;   Fair  Judgement:  Fair  Insight:  Fair  Psychomotor Activity:  Decreased  Concentration:  Concentration: Fair and Attention Span: Fair  Recall:  [employer] of Knowledge: Fair  Language: Fair  Akathisia:  No  Handed:  Right  AIMS (if indicated): not done  Assets:  Communication Skills Desire for Improvement Housing Social Support  ADL's:  Intact  Cognition: WNL  Sleep:  Fair   Screenings: AIMS     Office Visit from 05/15/2017 in [facility]  AIMS Total Score  0    GAD-7     Office Visit from 10/02/2017 in [facility] Office Visit from 10/24/2016 in [facility] Office Visit from 11/16/2015 in [place] [facility] Office Visit from 04/29/2015 in [facility] Office Visit from 02/13/2015 in [facility]  Total GAD-7 Score  20  8  20  20  19     PHQ2-9     Office Visit from 10/02/2017 in [facility] Office Visit from 10/24/2016 in [facility] Office Visit from 11/16/2015 in [facility] Counselor from 05/26/2015 in [facility] Office Visit from 04/29/2015 in [facility]  PHQ-2 Total Score  6  2  6  6  5   PHQ-9 Total Score  24  10  24  25  23        Assessment and Plan: Bipolar disorder, depressed type.  Major depressive disorder moderate.  I review her records  and collateral information from other providers.  Patient has chronic symptoms of depression irritability and paranoia.  She feels none of the medicine helped her in the past.  She is not willing for ECT/TMS.  She did not follow-up with Dr. Toy Care intranasal ketamine.  However [facility].  She had never tried Cymbalta before and I recommended to try Cymbalta 20 mg daily for 1 week and then 40 mg daily.  I will continue Lamictal 100 mg twice a day, Latuda 80 mg at bedtime and temazepam 50 mg at bedtime.  I discussed the medication in length especially rash with Lamictal.  Today her blood pressure is very high and she is aware but also told it is a chronic issues and she was told to take extra blood pressure pills when it is high.  I encourage that she should see urgent care if symptoms started to get worse which she promised.  Patient does not have any chest pain, sweating or any shortness of breath.  Recommended to call us back if she has any question or any concern.  Discussed safety concerns at any time having active suicidal thoughts or homicidal health and she need to call 911 or go to local emergency room.  Follow-up in 4 to 6 weeks.  Time spent 25 minutes.  More than 50% of the time spent in psychoeducation, counseling and coordination of care.   [name], MD 02/26/2018, 4:00 PM

## 2018-02-28 ENCOUNTER — Ambulatory Visit: Payer: 59 | Admitting: Psychology

## 2018-03-08 ENCOUNTER — Telehealth: Payer: Self-pay | Admitting: Family Medicine

## 2018-03-08 NOTE — Telephone Encounter (Signed)
1) Medication(s) Requested (by name): -carvedilol (COREG) 25 MG tablet  -losartan-hydrochlorothiazide (HYZAAR) 100-25 MG tablet   2) Pharmacy of Choice: -[place], [place] 3) Special Requests:   Approved medications will be sent to the pharmacy, we will reach out if there is an issue.  Requests made after 3pm may not be addressed until the following business day!  If a patient is unsure of the name of the medication(s) please note and ask patient to call back when they are able to provide all info, do not send to responsible party until all information is available!

## 2018-03-09 ENCOUNTER — Other Ambulatory Visit: Payer: Self-pay

## 2018-03-09 MED ORDER — LOSARTAN POTASSIUM 100 MG PO TABS: 100.0000 mg | ORAL_TABLET | Freq: Every day | ORAL | 0 refills | Status: DC

## 2018-03-09 MED ORDER — HYDROCHLOROTHIAZIDE 25 MG PO TABS: 25.0000 mg | ORAL_TABLET | Freq: Every day | ORAL | 0 refills | Status: DC

## 2018-03-09 NOTE — Telephone Encounter (Signed)
[facility] called to ask for a verbal order to separate the Losartan and HCTZ due to nationwide backorder. I spoke with the pharmacist who said the patient had #90 tabs left on her RX of the combo so I authorized the switch to the single components for #90tabs each.

## 2018-03-19 ENCOUNTER — Ambulatory Visit: Payer: 59 | Attending: Family Medicine | Admitting: Family Medicine

## 2018-03-19 ENCOUNTER — Encounter: Payer: Self-pay | Admitting: Family Medicine

## 2018-03-19 VITALS — BP 138/92 | HR 83 | Temp 98.0°F | Ht 61.0 in | Wt 180.4 lb

## 2018-03-19 DIAGNOSIS — Z881 Allergy status to other antibiotic agents status: Secondary | ICD-10-CM | POA: Insufficient documentation

## 2018-03-19 DIAGNOSIS — N3941 Urge incontinence: Secondary | ICD-10-CM | POA: Diagnosis not present

## 2018-03-19 DIAGNOSIS — I10 Essential (primary) hypertension: Secondary | ICD-10-CM | POA: Diagnosis not present

## 2018-03-19 DIAGNOSIS — R42 Dizziness and giddiness: Secondary | ICD-10-CM | POA: Insufficient documentation

## 2018-03-19 DIAGNOSIS — R400 Somnolence: Secondary | ICD-10-CM | POA: Insufficient documentation

## 2018-03-19 DIAGNOSIS — F3162 Bipolar disorder, current episode mixed, moderate: Secondary | ICD-10-CM | POA: Diagnosis not present

## 2018-03-19 DIAGNOSIS — Z7982 Long term (current) use of aspirin: Secondary | ICD-10-CM | POA: Insufficient documentation

## 2018-03-19 DIAGNOSIS — Z79899 Other long term (current) drug therapy: Secondary | ICD-10-CM | POA: Insufficient documentation

## 2018-03-19 DIAGNOSIS — K219 Gastro-esophageal reflux disease without esophagitis: Secondary | ICD-10-CM | POA: Insufficient documentation

## 2018-03-19 DIAGNOSIS — R600 Localized edema: Secondary | ICD-10-CM | POA: Insufficient documentation

## 2018-03-19 DIAGNOSIS — Z0000 Encounter for general adult medical examination without abnormal findings: Secondary | ICD-10-CM

## 2018-03-19 DIAGNOSIS — F419 Anxiety disorder, unspecified: Secondary | ICD-10-CM | POA: Insufficient documentation

## 2018-03-19 MED ORDER — HYDROCHLOROTHIAZIDE 25 MG PO TABS: 25.0000 mg | ORAL_TABLET | Freq: Every day | ORAL | 1 refills | Status: DC

## 2018-03-19 MED ORDER — OXYBUTYNIN CHLORIDE 5 MG PO TABS: 5.0000 mg | ORAL_TABLET | Freq: Two times a day (BID) | ORAL | 1 refills | Status: DC

## 2018-03-19 MED ORDER — PANTOPRAZOLE SODIUM 40 MG PO TBEC: 40.0000 mg | DELAYED_RELEASE_TABLET | Freq: Every day | ORAL | 1 refills | Status: DC

## 2018-03-19 MED ORDER — FUROSEMIDE 20 MG PO TABS: 20.0000 mg | ORAL_TABLET | Freq: Every day | ORAL | 1 refills | Status: DC

## 2018-03-19 MED ORDER — CARVEDILOL 25 MG PO TABS: 25.0000 mg | ORAL_TABLET | Freq: Two times a day (BID) | ORAL | 1 refills | Status: DC

## 2018-03-19 MED ORDER — LOSARTAN POTASSIUM 100 MG PO TABS: 100.0000 mg | ORAL_TABLET | Freq: Every day | ORAL | 1 refills | Status: DC

## 2018-03-19 MED ORDER — POTASSIUM CHLORIDE CRYS ER 20 MEQ PO TBCR
20.0000 meq | EXTENDED_RELEASE_TABLET | Freq: Every day | ORAL | 1 refills | Status: DC
Start: 2018-03-19 — End: 2019-02-11

## 2018-03-19 NOTE — Progress Notes (Signed)
Subjective:  Patient ID: Brandy [name], female    DOB: Jul 15, 1965  Age: 52 y.o. MRN: [id number]  CC: Hypertension   HPI Brandy [name]  is a 52 year old female with a history of hypertension, bipolar disorder, vertigo, GERD, history of pulmonary edema who comes into the clinic for a follow-up visit. She is still depressed despite multiple regimen changes by her psychiatrist and is currently on temazepam, Lamictal, Cymbalta, Latuda and complains of excessive sedation throughout the day ever since she commenced this regimen. Her last office visit was last month with her psychiatrist and she lacks motivation, has anhedonia and sometimes feels herself trembling.  As per psych notes she declined ECT/TMS.  She works part-time but has a good support from her colleagues will help her out when she gets in a dysphoric mood.  Denies suicidal intent. She is compliant with her antihypertensive and reflux symptoms have been controlled. Denies pedal edema and wants to remain on Lasix as it has helped the shortness of breath she experienced while with pulmonary edema and would not like to have that feeling again. She informs me she had a colonoscopy 6 years ago at [facility].  Past Medical History:  Diagnosis Date  . Anxiety   . Depression   . Diverticulitis   . Edema   . Gallstones   . Hypertension   . Osteoarthritis     Past Surgical History:  Procedure Laterality Date  . GALLBLADDER SURGERY      Allergies  Allergen Reactions  . Azithromycin Other (See Comments)    "Stomach cramps"  . Contrast Media [Iodinated Diagnostic Agents] Hives  . 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 aspirin EC 81 MG EC tablet Take 1 tablet (81 mg total) by mouth daily.    . DULoxetine (CYMBALTA) 20 MG capsule Take 1 capsule (20 mg total) by mouth daily. Take one capsule daily for 1 week  and than twice daily 60 capsule 2  . lamoTRIgine (LAMICTAL) 100 MG tablet Take 1 tablet (100 mg total) by mouth 2 (two) times daily. 180 tablet 0  . lurasidone (LATUDA) 80 MG TABS tablet Take 1 tablet (80 mg total) by mouth daily with breakfast. 90 tablet 0  . Potassium Chloride 40 MEQ/15ML (20%) SOLN Take 20 mEq by mouth daily. 225 mL 3  . temazepam (RESTORIL) 15 MG capsule Take 1 capsule (15 mg total) by mouth at bedtime. 30 capsule 2  . carvedilol (COREG) 25 MG tablet Take 1 tablet (25 mg total) by mouth 2 (two) times daily with a meal. 180 tablet 1  . furosemide (LASIX) 20 MG tablet Take 1 tablet (20 mg total) by mouth daily. 90 tablet 1  . hydrochlorothiazide (HYDRODIURIL) 25 MG tablet Take 1 tablet (25 mg total) by mouth daily. 90 tablet 0  . losartan (COZAAR) 100 MG tablet Take 1 tablet (100 mg total) by mouth daily. 90 tablet 0  . pantoprazole (PROTONIX) 40 MG tablet Take 1 tablet (40 mg total) by mouth daily. 90 tablet 1  . oxybutynin (DITROPAN) 5 MG tablet Take 1 tablet (5 mg total) by mouth 2 (two) times daily. (Patient not taking: Reported on 03/19/2018) 60 tablet 3   No facility-administered medications prior to visit.     ROS Review of Systems  Constitutional: Negative for activity change, appetite change and fatigue.  HENT: Negative for congestion, sinus  pressure and sore throat.   Eyes: Negative for visual disturbance.  Respiratory: Negative for cough, chest tightness, shortness of breath and wheezing.   Cardiovascular: Negative for chest pain and palpitations.  Gastrointestinal: Negative for abdominal distention, abdominal pain and constipation.  Endocrine: Negative for polydipsia.  Genitourinary: Negative for dysuria and frequency.  Musculoskeletal: Negative for arthralgias and back pain.  Skin: Negative for rash.  Neurological: Negative for tremors, light-headedness and numbness.  Hematological: Does not bruise/bleed easily.  Psychiatric/Behavioral: Positive for  dysphoric mood. Negative for agitation and behavioral problems.    Objective:  BP (!) 138/92   [name] 83   Temp 98 F (36.7 C) (Oral)   Ht 5\' 1"  (1.549 m)   Wt 180 lb 6.4 oz (81.8 kg)   SpO2 99%   BMI 34.09 kg/m   BP/Weight 03/19/2018 02/26/2018 3/47/4259  Systolic BP 563 875 643  Diastolic BP 92 329 76  Wt. (Lbs) 180.4 181 185  BMI 34.09 34.2 34.96      Physical Exam Constitutional:      Appearance: She is well-developed.  Cardiovascular:     Rate and Rhythm: Normal rate.     Heart sounds: Normal heart sounds. No murmur.  Pulmonary:     Effort: Pulmonary effort is normal.     Breath sounds: Normal breath sounds. No wheezing or rales.  Chest:     Chest wall: No tenderness.  Abdominal:     General: Bowel sounds are normal. There is no distension.     Palpations: Abdomen is soft. There is no mass.     Tenderness: There is no abdominal tenderness.  Musculoskeletal: Normal range of motion.  Neurological:     Mental Status: She is alert and oriented to person, place, and time.  Psychiatric:     Comments: Dysphoric mood     CMP Latest Ref Rng & Units 10/02/2017 10/24/2016 10/08/2015  Glucose 65 - 99 mg/dL 92 97 104(H)  BUN 6 - 24 mg/dL 16 10 13   Creatinine 0.57 - 1.00 mg/dL 1.14(H) 1.14(H) 0.99  Sodium 134 - 144 mmol/L 140 145(H) 136  Potassium 3.5 - 5.2 mmol/L 4.2 3.9 3.7  Chloride 96 - 106 mmol/L 101 103 106  CO2 20 - 29 mmol/L 28 26 26   Calcium 8.7 - 10.2 mg/dL 10.3(H) 9.0 9.4  Total Protein 6.0 - 8.5 g/dL 7.7 7.2 6.7  Total Bilirubin 0.0 - 1.2 mg/dL <0.2 0.2 0.4  Alkaline Phos 39 - 117 IU/L 78 67 59  AST 0 - 40 IU/L 16 13 20   ALT 0 - 32 IU/L 17 13 17     Lipid Panel     Component Value Date/Time   CHOL 157 10/24/2016 1027   TRIG 117 10/24/2016 1027   HDL 47 10/24/2016 1027   CHOLHDL 3.3 10/24/2016 1027   CHOLHDL 3.1 02/03/2015 0420   VLDL 24 02/03/2015 0420   LDLCALC 87 10/24/2016 1027    Assessment & Plan:   1. Essential hypertension Diastolic  elevation No regimen change today Counseled on blood pressure goal of less than 130/80, low-sodium, DASH diet, medication compliance, 150 minutes of moderate intensity exercise per week. Discussed medication compliance, adverse effects. - carvedilol (COREG) 25 MG tablet; Take 1 tablet (25 mg total) by mouth 2 (two) times daily with a meal.  Dispense: 180 tablet; Refill: 1 - hydrochlorothiazide (HYDRODIURIL) 25 MG tablet; Take 1 tablet (25 mg total) by mouth daily.  Dispense: 90 tablet; Refill: 1 - losartan (COZAAR) 100 MG tablet; Take 1 tablet (  100 mg total) by mouth daily.  Dispense: 90 tablet; Refill: 1 - Basic Metabolic Panel  2. Pedal edema Stable - furosemide (LASIX) 20 MG tablet; Take 1 tablet (20 mg total) by mouth daily.  Dispense: 90 tablet; Refill: 1  3. Urge incontinence Controlled - oxybutynin (DITROPAN) 5 MG tablet; Take 1 tablet (5 mg total) by mouth 2 (two) times daily.  Dispense: 180 tablet; Refill: 1  4. Gastroesophageal reflux disease without esophagitis Controlled - pantoprazole (PROTONIX) 40 MG tablet; Take 1 tablet (40 mg total) by mouth daily.  Dispense: 90 tablet; Refill: 1  5. Health care maintenance She would rather have her Pap smear with GYN - Ambulatory referral to Gynecology  6. Bipolar disorder, current episode mixed, moderate ([place]) Uncontrolled Declined ECT Followed closely by behavioral health Advised to reach out to her psychiatrist regarding the somnolence she is experiencing as a result of her pharmacotherapy  Meds ordered this encounter  Medications  . carvedilol (COREG) 25 MG tablet    Sig: Take 1 tablet (25 mg total) by mouth 2 (two) times daily with a meal.    Dispense:  180 tablet    Refill:  1  . furosemide (LASIX) 20 MG tablet    Sig: Take 1 tablet (20 mg total) by mouth daily.    Dispense:  90 tablet    Refill:  1  . hydrochlorothiazide (HYDRODIURIL) 25 MG tablet    Sig: Take 1 tablet (25 mg total) by mouth daily.    Dispense:  90  tablet    Refill:  1  . losartan (COZAAR) 100 MG tablet    Sig: Take 1 tablet (100 mg total) by mouth daily.    Dispense:  90 tablet    Refill:  1  . oxybutynin (DITROPAN) 5 MG tablet    Sig: Take 1 tablet (5 mg total) by mouth 2 (two) times daily.    Dispense:  180 tablet    Refill:  1  . pantoprazole (PROTONIX) 40 MG tablet    Sig: Take 1 tablet (40 mg total) by mouth daily.    Dispense:  90 tablet    Refill:  1  . potassium chloride SA (K-DUR,KLOR-CON) 20 MEQ tablet    Sig: Take 1 tablet (20 mEq total) by mouth daily.    Dispense:  90 tablet    Refill:  1    Follow-up: Return in about 3 months (around 06/18/2018) for follow up of chronic medical conditions.   Charlott Rakes MD

## 2018-03-20 ENCOUNTER — Telehealth: Payer: Self-pay | Admitting: Obstetrics and Gynecology

## 2018-03-20 LAB — BASIC METABOLIC PANEL
BUN / CREAT RATIO: 18 (ref 9–23)
BUN: 18 mg/dL (ref 6–24)
CO2: 24 mmol/L (ref 20–29)
CREATININE: 1.01 mg/dL — AB (ref 0.57–1.00)
Calcium: 10.1 mg/dL (ref 8.7–10.2)
Chloride: 99 mmol/L (ref 96–106)
GFR, EST AFRICAN AMERICAN: 74 mL/min/{1.73_m2} (ref >59)
GFR, EST NON AFRICAN AMERICAN: 64 mL/min/{1.73_m2} (ref >59)
Glucose: 101 mg/dL — ABNORMAL HIGH (ref 65–99)
POTASSIUM: 3.9 mmol/L (ref 3.5–5.2)
SODIUM: 140 mmol/L (ref 134–144)

## 2018-03-20 NOTE — Telephone Encounter (Signed)
Called and left a message for patient to call back to schedule a new patient doctor referral appointment with our office to see any provider for: Health care maintenance.

## 2018-03-21 ENCOUNTER — Telehealth: Payer: Self-pay

## 2018-03-21 ENCOUNTER — Ambulatory Visit (INDEPENDENT_AMBULATORY_CARE_PROVIDER_SITE_OTHER): Payer: 59 | Admitting: Psychology

## 2018-03-21 DIAGNOSIS — F3189 Other bipolar disorder: Secondary | ICD-10-CM

## 2018-03-21 NOTE — Telephone Encounter (Signed)
Called and left a message for patient to call back to schedule a new patient doctor referral appointment with our office to see any provider for: Health care maintenance.

## 2018-03-21 NOTE — Telephone Encounter (Signed)
-----   Message from Charlott Rakes, MD sent at 03/21/2018  1:53 PM EST ----- Labs are stable

## 2018-03-21 NOTE — Telephone Encounter (Signed)
Patient was called and a voicemail was left informing patient to return phone call for lab results. 

## 2018-03-22 ENCOUNTER — Encounter: Payer: Self-pay | Admitting: Obstetrics and Gynecology

## 2018-03-26 NOTE — Telephone Encounter (Signed)
Patient was called and a voicemail was left informing patient to return phone call for lab results. 

## 2018-04-11 ENCOUNTER — Ambulatory Visit (INDEPENDENT_AMBULATORY_CARE_PROVIDER_SITE_OTHER): Payer: 59 | Admitting: Psychology

## 2018-04-11 DIAGNOSIS — F3189 Other bipolar disorder: Secondary | ICD-10-CM | POA: Diagnosis not present

## 2018-04-13 ENCOUNTER — Ambulatory Visit: Payer: 59 | Admitting: Obstetrics and Gynecology

## 2018-04-25 ENCOUNTER — Ambulatory Visit: Payer: Self-pay | Admitting: Psychology

## 2018-04-26 ENCOUNTER — Ambulatory Visit (HOSPITAL_COMMUNITY): Payer: 59 | Admitting: Psychiatry

## 2018-06-19 ENCOUNTER — Ambulatory Visit (HOSPITAL_COMMUNITY): Payer: 59 | Admitting: Psychiatry

## 2018-06-20 ENCOUNTER — Telehealth: Payer: Self-pay | Admitting: Family Medicine

## 2018-06-20 NOTE — Telephone Encounter (Signed)
Patients call taken.  Patient identified by name and date of birth. 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  Patients was given the current CDC precautions and told when to stay home.  Patient states she has had congestions.  Since January and was wanting to move her appointment to an earlier date.

## 2018-06-20 NOTE — Telephone Encounter (Signed)
Hey francis can you follow up with thi patient on what to do about COVID-19

## 2018-06-20 NOTE — Telephone Encounter (Signed)
Pt called stated she has concerns on covid-19 pt would like to know what her options are to showing up to work since she is a high risk pt because she has a heart disease pt would like a follow up call

## 2018-06-20 NOTE — Telephone Encounter (Signed)
Thanks for addressing her concerns about COVID- 19.  Can you please see if the front desk has an opening with any available provider for her?  Thank you

## 2018-06-22 NOTE — Telephone Encounter (Signed)
Nursing contacted registration and new appointment has been made.

## 2018-06-25 ENCOUNTER — Ambulatory Visit: Payer: Self-pay | Admitting: Family Medicine

## 2018-06-27 ENCOUNTER — Ambulatory Visit: Payer: Self-pay | Admitting: Family Medicine

## 2018-07-05 ENCOUNTER — Ambulatory Visit: Payer: Self-pay | Admitting: Family Medicine

## 2018-07-17 ENCOUNTER — Ambulatory Visit (INDEPENDENT_AMBULATORY_CARE_PROVIDER_SITE_OTHER): Payer: 59 | Admitting: Psychiatry

## 2018-07-17 ENCOUNTER — Other Ambulatory Visit: Payer: Self-pay

## 2018-07-17 DIAGNOSIS — F319 Bipolar disorder, unspecified: Secondary | ICD-10-CM

## 2018-07-17 DIAGNOSIS — F331 Major depressive disorder, recurrent, moderate: Secondary | ICD-10-CM

## 2018-07-17 MED ORDER — LURASIDONE HCL 80 MG PO TABS: 80.0000 mg | ORAL_TABLET | Freq: Every day | ORAL | 0 refills | Status: DC

## 2018-07-17 MED ORDER — LAMOTRIGINE 100 MG PO TABS: 100.0000 mg | ORAL_TABLET | Freq: Two times a day (BID) | ORAL | 0 refills | Status: DC

## 2018-07-17 MED ORDER — TEMAZEPAM 15 MG PO CAPS: 15.0000 mg | ORAL_CAPSULE | Freq: Every day | ORAL | 2 refills | Status: DC

## 2018-07-17 MED ORDER — DULOXETINE HCL 20 MG PO CPEP: 20.0000 mg | ORAL_CAPSULE | Freq: Every day | ORAL | 0 refills | Status: DC

## 2018-07-17 NOTE — Progress Notes (Signed)
Virtual Visit via Telephone Note  I connected with Domingo Pulse on 07/17/18 at  9:00 AM EDT by telephone and verified that I am speaking with the correct person using two identifiers.   I discussed the limitations, risks, security and privacy concerns of performing an evaluation and management service by telephone and the availability of in person appointments. I also discussed with the patient that there may be a patient responsible charge related to this service. The patient expressed understanding and agreed to proceed.   History of Present Illness: Patient was evaluated through phone conversation.  She admitted increased paranoia, poor sleep and having nightmares.  She admitted not taking temazepam because she is out.  She missed the last appointment.  She admitted crying spells but denies any suicidal thoughts or homicidal thought.  She is afraid of pandemic coronavirus and does not leave the house.  She did not make appointment for intranasal ketamine.  She refused ECT and [place].  She did not recall any tremors, rash or itching from the medication.  She was prescribed Latuda, Lamictal and temazepam but she is been noncompliant with medication for past 2 weeks.  The only medicines he is taking the Cymbalta 20 mg but was recommended to take 20 mg after 1 week but she forgot.  She lives with her husband and daughter.  She is not seeing therapist.  She endorsed chronic fatigue, depression, decreased energy and lack of motivation to do things.  Patient has multiple health issues.  She is trying to get appointment to see new physician however she was told that due to pandemic coronavirus no new medication is seen these days.   Past Psychiatric History: Reviewed. Hospitalized in July 10, 1995 after her mother died.  She was treated at [facility] from 1990/07/702/07/00.  She saw Dr. Gildardo Griffes and did IOP in Jul 10, 2015.  No history of suicidal attempt.  History of paranoia and diagnosed with bipolar daughter and major  depression.  Had tried Zoloft, Wellbutrin, Rexulti, Klonopin, Abilify, Lexapro, Risperdal and Seroquel.   No results found for this or any previous visit (from the past 07-10-58 hour(s)).    Observations/Objective: Limited mental status examination done on the phone.  Patient described her mood scared and anxious.  She admitted paranoia and gets easily irritable on the phone.  She denies any suicidal thoughts or homicidal thought.  She denies any hallucination but endorsed nightmares.  Her speech is slow.  She gets distracted on the phone and her attention concentration is fair.  She is alert and oriented x3 but overall her memory and fund of knowledge is below average.  Her insight and judgment is fair.  Assessment and Plan: Bipolar disorder, depressed type.  Major depressive disorder, recurrent.  Discussed need of medication and compliance.  She is without temazepam, Lamictal and Latuda.  She is taking 20 mg and forgot to increase 40 mg.  She is not seeing therapist.  She did not call Dr Toy Care for intranasal ketamine.  She is not interested in [place] and ECT.  I had a long discussion with the patient about long-term prognosis with poor compliance with medication.  Patient promised that she will take the medication.  She admitted when she was taking medication she was not as bad.  She also promised to contact Dr Toy Care office for intranasal ketamine.  I discussed her other health issues that she need to see primary care physician for medical needs.  She agreed and she will call again primary care physician to  set up an appointment.  I will continue Latuda 80 mg at bedtime, temazepam 15 mg at bedtime, Lamictal 100 mg twice a day and recommend to take Cymbalta 40 mg daily.  Discussed medication side effect specially Lamictal can cause rash and in that case she need to stop the medication immediately.  Follow-up in 6 weeks to 2 months.  Discussed safety concern that anytime having active suicidal thoughts or  homicidal thought then she need to call 911 or go to local emergency room.    Follow Up Instructions:    I discussed the assessment and treatment plan with the patient. The patient was provided an opportunity to ask questions and all were answered. The patient agreed with the plan and demonstrated an understanding of the instructions.   The patient was advised to call back or seek an in-person evaluation if the symptoms worsen or if the condition fails to improve as anticipated.  I provided 25 minutes of non-face-to-face time during this encounter.   [name], MD

## 2018-10-25 ENCOUNTER — Encounter (HOSPITAL_COMMUNITY): Payer: Self-pay | Admitting: Psychiatry

## 2018-10-25 ENCOUNTER — Ambulatory Visit (INDEPENDENT_AMBULATORY_CARE_PROVIDER_SITE_OTHER): Payer: 59 | Admitting: Psychiatry

## 2018-10-25 ENCOUNTER — Other Ambulatory Visit: Payer: Self-pay

## 2018-10-25 DIAGNOSIS — F411 Generalized anxiety disorder: Secondary | ICD-10-CM | POA: Diagnosis not present

## 2018-10-25 DIAGNOSIS — F319 Bipolar disorder, unspecified: Secondary | ICD-10-CM | POA: Diagnosis not present

## 2018-10-25 MED ORDER — LAMOTRIGINE 100 MG PO TABS: 100.0000 mg | ORAL_TABLET | Freq: Two times a day (BID) | ORAL | 0 refills | Status: DC

## 2018-10-25 MED ORDER — LURASIDONE HCL 80 MG PO TABS: 80.0000 mg | ORAL_TABLET | Freq: Every day | ORAL | 0 refills | Status: DC

## 2018-10-25 MED ORDER — DULOXETINE HCL 30 MG PO CPEP: 30.0000 mg | ORAL_CAPSULE | Freq: Every day | ORAL | 0 refills | Status: DC

## 2018-10-25 MED ORDER — TEMAZEPAM 15 MG PO CAPS: 15.0000 mg | ORAL_CAPSULE | Freq: Every day | ORAL | 2 refills | Status: DC

## 2018-10-25 NOTE — Progress Notes (Signed)
Virtual Visit via Telephone Note  I connected with Brandy [name] on 10/25/18 at 10:40 AM EDT by telephone and verified that I am speaking with the correct person using two identifiers.   I discussed the limitations, risks, security and privacy concerns of performing an evaluation and management service by telephone and the availability of in person appointments. I also discussed with the patient that there may be a patient responsible charge related to this service. The patient expressed understanding and agreed to proceed.   History of Present Illness: Patient was evaluated by phone session.  She is complaining of poor sleep.  When I ask about if she is taking temazepam she replied no because she ran out.  Patient was explained that she has additional refill and she apologized not getting refill done.  She also complaining of anxiety because she has to wear mask for hours when she goes to work.  She works part-time at [employer] at [employer].  She has to wear mask and sometimes she feels suffocated, nauseated and increased anxious.  She is not sure if she is able to work like this but she needed a job.  She lives with her daughter and husband who is supportive.  She also not sure about Cymbalta compliance.  She takes Cymbalta 20 mg 1 a day and forgot to take second dose.  She did felt that Cymbalta did help her depression and anxiety.  She still have some time paranoia and irritability.  She is taking Lamictal and reported no tremors or rash.  She is not interested in intranasal ketamine anymore because she is afraid to go to doctor's office due to pandemic.  She is also not seeing her therapist Ceasar Lund since the [place] but agreed to schedule appointment to see a therapist in our office virtually.  Patient endorsed chronic fatigue, anxiety, lack of motivation to do things.  She has multiple health issues.  She has not seen primary care physician which was recommended to have her blood work.  But  promised to have it done very soon.  Recently 1 of her cousin died due to [place] and her niece died due to heart attack.  She is very nervous and anxious about the current situation.  Patient denies drinking or using any illegal substances.  Her appetite is fair.  She sleeps good when she takes the temazepam.  Past Psychiatric History:Reviewed. Hospitalized in 1995-07-21 after her mother died. She was treated at [facility] from 1990-2000. She saw Dr. Dina Rich did IOP in 21-Jul-2015.No history of suicidal attempt. History of paranoia and diagnosed with bipolar daughter and major depression. Had tried Zoloft, Wellbutrin, Rexulti, Klonopin, Abilify, Lexapro, Risperdal and Seroquel.    Psychiatric Specialty Exam: Physical Exam  ROS  There were no vitals taken for this visit.There is no height or weight on file to calculate BMI.  General Appearance: NA  Eye Contact:  NA  Speech:  Clear and Coherent and Slow  Volume:  Decreased  Mood:  Anxious and Dysphoric  Affect:  NA  Thought Process:  Descriptions of Associations: Intact  Orientation:  Full (Time, Place, and Person)  Thought Content:  Paranoid Ideation and Rumination  Suicidal Thoughts:  No  Homicidal Thoughts:  No  Memory:  Immediate;   Fair Recent;   Good Remote;   Good  Judgement:  Fair  Insight:  Fair  Psychomotor Activity:  NA  Concentration:  Concentration: Fair and Attention Span: Fair  Recall:  [employer] of Knowledge:  Good  Language:  Good  Akathisia:  No  Handed:  Right  AIMS (if indicated):     Assets:  Communication Skills Desire for [place] Talents/Skills Transportation  ADL's:  Intact  Cognition:  WNL  Sleep:   fair      Assessment and Plan: Bipolar disorder type I.  Generalized anxiety disorder.  Discuss compliance especially sleep medication and Cymbalta as he is taking only 20 mg Cymbalta.  She see improvement with Cymbalta.  She admitted sometimes forget to take  second dose.  I recommend to take Cymbalta 30 mg only once a day and if she can tolerate well we will consider increasing the dose and her next appointment.  I will continue Latuda 80 mg at bedtime, Lamictal 100 mg twice a day and encourage to take temazepam 15 mg every night to help to sleep.  Discussed medication side effect specially benzodiazepine dependence tolerance and withdrawal.  She has no rash or itching.  We will schedule appointment to see a therapist in our office.  She is not interested in ECT, [place] and now intranasal ketamine because she does not want to go any other doctor's office in pandemic.  Reinforced to schedule appointment with primary care physician for blood work.  Recommended to call us back if she has any question or any concern.  Follow-up in 3 months.  Follow Up Instructions:    I discussed the assessment and treatment plan with the patient. The patient was provided an opportunity to ask questions and all were answered. The patient agreed with the plan and demonstrated an understanding of the instructions.   The patient was advised to call back or seek an in-person evaluation if the symptoms worsen or if the condition fails to improve as anticipated.  I provided 20 minutes of non-face-to-face time during this encounter.   [name], MD

## 2018-12-31 ENCOUNTER — Encounter (HOSPITAL_COMMUNITY): Payer: Self-pay

## 2019-01-21 ENCOUNTER — Ambulatory Visit (INDEPENDENT_AMBULATORY_CARE_PROVIDER_SITE_OTHER): Payer: 59 | Admitting: Psychiatry

## 2019-01-21 ENCOUNTER — Encounter (HOSPITAL_COMMUNITY): Payer: Self-pay | Admitting: Psychiatry

## 2019-01-21 ENCOUNTER — Other Ambulatory Visit: Payer: Self-pay

## 2019-01-21 DIAGNOSIS — F319 Bipolar disorder, unspecified: Secondary | ICD-10-CM | POA: Diagnosis not present

## 2019-01-21 DIAGNOSIS — F411 Generalized anxiety disorder: Secondary | ICD-10-CM | POA: Diagnosis not present

## 2019-01-21 MED ORDER — LAMOTRIGINE 150 MG PO TABS: 150.0000 mg | ORAL_TABLET | Freq: Two times a day (BID) | ORAL | 0 refills | Status: DC

## 2019-01-21 MED ORDER — LURASIDONE HCL 80 MG PO TABS: 80.0000 mg | ORAL_TABLET | Freq: Every day | ORAL | 0 refills | Status: DC

## 2019-01-21 MED ORDER — TEMAZEPAM 15 MG PO CAPS: 15.0000 mg | ORAL_CAPSULE | Freq: Every day | ORAL | 2 refills | Status: DC

## 2019-01-21 MED ORDER — DULOXETINE HCL 30 MG PO CPEP: 30.0000 mg | ORAL_CAPSULE | Freq: Every day | ORAL | 0 refills | Status: DC

## 2019-01-21 NOTE — Progress Notes (Signed)
Virtual Visit via Telephone Note  I connected with Domingo Pulse on 01/21/19 at 11:00 AM EDT by telephone and verified that I am speaking with the correct person using two identifiers.   I discussed the limitations, risks, security and privacy concerns of performing an evaluation and management service by telephone and the availability of in person appointments. I also discussed with the patient that there may be a patient responsible charge related to this service. The patient expressed understanding and agreed to proceed.   History of Present Illness: Patient was evaluated by phone session.  On her last visit we increased Cymbalta from 20 mg to 30 mg.  Patient told it helped her anxiety and depression but she continues to have irritability, mood swings and anger.  Today she is complaining of chronic pain.  She works part-time at [employer] at short stay.  She admitted worried about current situation as she has to wear masks all the time.  She is sleeping better since she resumed temazepam.  She lives with her husband and her daughter.  Her husband has recently surgery and she is worried about him.  Patient is compliant with the medication.  She has no tremors, shakes or any EPS.  She admitted some time paranoia but denies any hallucination, suicidal thoughts or homicidal thoughts.  She is taking Lamictal, temazepam, Latuda and Cymbalta.  She used to see her therapist however due to Covid not able to schedule appointment in a while.  She is willing to give a trial for virtual therapy and interested if we can find a therapist for her.  Past Psychiatric History:Reviewed. Hospitalized in July 07, 1995 after her mother died. She was treated at [facility] from 1990-2000. She saw Dr. Dina Rich did IOP in 07-Jul-2015.No history of suicidal attempt. History of paranoia and diagnosed with bipolar daughter and major depression. Had tried Zoloft, Wellbutrin, Rexulti, Klonopin, Abilify, Lexapro, Risperdal and  Seroquel.   Psychiatric Specialty Exam: Physical Exam  ROS  There were no vitals taken for this visit.There is no height or weight on file to calculate BMI.  General Appearance: NA  Eye Contact:  NA  Speech:  Clear and Coherent and Slow  Volume:  Decreased  Mood:  Depressed and Irritable  Affect:  NA  Thought Process:  Descriptions of Associations: Intact  Orientation:  Full (Time, Place, and Person)  Thought Content:  Paranoid Ideation and Rumination  Suicidal Thoughts:  No  Homicidal Thoughts:  No  Memory:  Immediate;   Good Recent;   Fair Remote;   Fair  Judgement:  Fair  Insight:  Present  Psychomotor Activity:  NA  Concentration:  Concentration: Fair and Attention Span: Fair  Recall:  Good  Fund of Knowledge:  Good  Language:  Good  Akathisia:  No  Handed:  Right  AIMS (if indicated):     Assets:  Communication Skills Desire for Improvement Housing Resilience Social Support  ADL's:  Intact  Cognition:  WNL  Sleep:   good      Assessment and Plan: Bipolar disorder type I.  Generalized anxiety disorder.  Patient is still struggling with irritability and mood swings.  Recommended to try Lamictal 150 mg twice a day.  Reinforced that she needs to wash the medication side effect specially rash and in that case she need to stop the Lamictal immediately.  She agreed with the plan.  Continue Latuda 80 mg at bedtime, Cymbalta 30 mg daily and temazepam 15 mg at bedtime which is helping her sleep.  We will provide therapist names and contact information as patient is not seeing her previous therapist Ceasar Lund since [place].  Discussed medication side effects and efficacy.  Recommended to call us back if she has any question or any concern.  Follow-up in 3 months.  Follow Up Instructions:    I discussed the assessment and treatment plan with the patient. The patient was provided an opportunity to ask questions and all were answered. The patient agreed with the plan and  demonstrated an understanding of the instructions.   The patient was advised to call back or seek an in-person evaluation if the symptoms worsen or if the condition fails to improve as anticipated.  I provided 20 minutes of non-face-to-face time during this encounter.   [name], MD

## 2019-02-11 ENCOUNTER — Ambulatory Visit: Payer: 59 | Attending: Family Medicine | Admitting: Family Medicine

## 2019-02-11 ENCOUNTER — Other Ambulatory Visit: Payer: Self-pay

## 2019-02-11 DIAGNOSIS — N3941 Urge incontinence: Secondary | ICD-10-CM | POA: Diagnosis not present

## 2019-02-11 DIAGNOSIS — R600 Localized edema: Secondary | ICD-10-CM | POA: Diagnosis not present

## 2019-02-11 DIAGNOSIS — I10 Essential (primary) hypertension: Secondary | ICD-10-CM

## 2019-02-11 DIAGNOSIS — J018 Other acute sinusitis: Secondary | ICD-10-CM | POA: Diagnosis not present

## 2019-02-11 DIAGNOSIS — K219 Gastro-esophageal reflux disease without esophagitis: Secondary | ICD-10-CM

## 2019-02-11 MED ORDER — POTASSIUM CHLORIDE CRYS ER 20 MEQ PO TBCR: 20.0000 meq | EXTENDED_RELEASE_TABLET | Freq: Every day | ORAL | 1 refills | Status: DC

## 2019-02-11 MED ORDER — OXYBUTYNIN CHLORIDE 5 MG PO TABS: 5.0000 mg | ORAL_TABLET | Freq: Two times a day (BID) | ORAL | 1 refills | Status: DC

## 2019-02-11 MED ORDER — FUROSEMIDE 20 MG PO TABS: 20.0000 mg | ORAL_TABLET | Freq: Every day | ORAL | 1 refills | Status: DC

## 2019-02-11 MED ORDER — CARVEDILOL 25 MG PO TABS: 25.0000 mg | ORAL_TABLET | Freq: Two times a day (BID) | ORAL | 1 refills | Status: DC

## 2019-02-11 MED ORDER — LOSARTAN POTASSIUM 100 MG PO TABS: 100.0000 mg | ORAL_TABLET | Freq: Every day | ORAL | 1 refills | Status: DC

## 2019-02-11 MED ORDER — HYDROCHLOROTHIAZIDE 25 MG PO TABS: 25.0000 mg | ORAL_TABLET | Freq: Every day | ORAL | 1 refills | Status: DC

## 2019-02-11 MED ORDER — AMOXICILLIN-POT CLAVULANATE 875-125 MG PO TABS: 1.0000 | ORAL_TABLET | Freq: Two times a day (BID) | ORAL | 0 refills | Status: DC

## 2019-02-11 MED ORDER — PANTOPRAZOLE SODIUM 40 MG PO TBEC: 40.0000 mg | DELAYED_RELEASE_TABLET | Freq: Every day | ORAL | 1 refills | Status: DC

## 2019-02-11 NOTE — Progress Notes (Signed)
Virtual Visit via Telephone Note  I connected with Domingo Pulse, on 02/11/2019 at 4:17 PM by telephone due to the COVID-19 pandemic and verified that I am speaking with the correct person using two identifiers.   Consent: I discussed the limitations, risks, security and privacy concerns of performing an evaluation and management service by telephone and the availability of in person appointments. I also discussed with the patient that there may be a patient responsible charge related to this service. The patient expressed understanding and agreed to proceed.   Location of Patient: Home  Location of Provider: Clinic   Persons participating in Telemedicine visit: Shericka M Aleen Sells [place]-CMA Dr. Felecia Shelling     History of Present Illness: Brandy [name]  is a 53 year old female with a history of hypertension, bipolar disorder, vertigo, GERD, history of pulmonary edema who comes into the clinic for a follow-up visit.  She was approved for disability recently. She has been down with a cold since 5 days ago with rhinorrhea, coughing and abdominal pain from coughing. No OTC regimens have worked. Denies fever but has chills every now and then. Her husband has a cold.Denies abnormal taste or smell. Husband was in the hospital with Kidney failure and Pneumonia and is now on dialysis this had her stressed. 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 Even the election worsened her anxiety. Seen by Dr Adele Schilder of Psych and Lamictal dose was increased at her last office visit last month.  Her depression is somewhat better but she is not at her best.  She is thankful for her daughter who graduated high school and is now in college and this is a source of joy for her.  She has been compliant with her antihypertensive and is needing refills.  Past Medical History:  Diagnosis Date   . Anxiety   . Depression   . Diverticulitis   . Edema   . Gallstones   . Hypertension   . Osteoarthritis    Allergies  Allergen Reactions  . Azithromycin Other (See Comments)    "Stomach cramps"  . Contrast Media [Iodinated Diagnostic Agents] Hives  . 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"    Current Outpatient Medications on File Prior to Visit  Medication Sig Dispense Refill  . aspirin EC 81 MG EC tablet Take 1 tablet (81 mg total) by mouth daily.    . carvedilol (COREG) 25 MG tablet Take 1 tablet (25 mg total) by mouth 2 (two) times daily with a meal. 180 tablet 1  . DULoxetine (CYMBALTA) 30 MG capsule Take 1 capsule (30 mg total) by mouth daily. Take one capsule twice daily 90 capsule 0  . furosemide (LASIX) 20 MG tablet Take 1 tablet (20 mg total) by mouth daily. 90 tablet 1  . hydrochlorothiazide (HYDRODIURIL) 25 MG tablet Take 1 tablet (25 mg total) by mouth daily. 90 tablet 1  . lamoTRIgine (LAMICTAL) 150 MG tablet Take 1 tablet (150 mg total) by mouth 2 (two) times daily. 180 tablet 0  . losartan (COZAAR) 100 MG tablet Take 1 tablet (100 mg total) by mouth daily. 90 tablet 1  . lurasidone (LATUDA) 80 MG TABS tablet Take 1 tablet (80 mg total) by mouth daily with breakfast. 90 tablet 0  . oxybutynin (DITROPAN) 5 MG tablet Take 1 tablet (5 mg total) by mouth  2 (two) times daily. 180 tablet 1  . pantoprazole (PROTONIX) 40 MG tablet Take 1 tablet (40 mg total) by mouth daily. 90 tablet 1  . Potassium Chloride 40 MEQ/15ML (20%) SOLN Take 20 mEq by mouth daily. 225 mL 3  . potassium chloride SA (K-DUR,KLOR-CON) 20 MEQ tablet Take 1 tablet (20 mEq total) by mouth daily. 90 tablet 1  . temazepam (RESTORIL) 15 MG capsule Take 1 capsule (15 mg total) by mouth at bedtime. 30 capsule 2   No current facility-administered medications on file prior to visit.     Observations/Objective: Awake, alert, oriented x3 Dysphoric mood  Assessment and  Plan: 1. Essential hypertension Stable Counseled on blood pressure goal of less than 130/80, low-sodium, DASH diet, medication compliance, 150 minutes of moderate intensity exercise per week. Discussed medication compliance, adverse effects. - carvedilol (COREG) 25 MG tablet; Take 1 tablet (25 mg total) by mouth 2 (two) times daily with a meal.  Dispense: 180 tablet; Refill: 1 - hydrochlorothiazide (HYDRODIURIL) 25 MG tablet; Take 1 tablet (25 mg total) by mouth daily.  Dispense: 90 tablet; Refill: 1 - Complete Metabolic Panel with GFR; Future - Lipid panel; Future - losartan (COZAAR) 100 MG tablet; Take 1 tablet (100 mg total) by mouth daily.  Dispense: 90 tablet; Refill: 1  2. Pedal edema - furosemide (LASIX) 20 MG tablet; Take 1 tablet (20 mg total) by mouth daily.  Dispense: 90 tablet; Refill: 1  3. Urge incontinence Controlled - oxybutynin (DITROPAN) 5 MG tablet; Take 1 tablet (5 mg total) by mouth 2 (two) times daily.  Dispense: 180 tablet; Refill: 1  4. Gastroesophageal reflux disease without esophagitis Controlled - pantoprazole (PROTONIX) 40 MG tablet; Take 1 tablet (40 mg total) by mouth daily.  Dispense: 90 tablet; Refill: 1  5. Acute non-recurrent sinusitis of other sinus Advised to rest, use analgesics, increase fluid intake - amoxicillin-clavulanate (AUGMENTIN) 875-125 MG tablet; Take 1 tablet by mouth 2 (two) times daily.  Dispense: 20 tablet; Refill: 0   Follow Up Instructions: 3 months   I discussed the assessment and treatment plan with the patient. The patient was provided an opportunity to ask questions and all were answered. The patient agreed with the plan and demonstrated an understanding of the instructions.   The patient was advised to call back or seek an in-person evaluation if the symptoms worsen or if the condition fails to improve as anticipated.     I provided 20 minutes total of non-face-to-face time during this encounter including median  intraservice time, reviewing previous notes, labs, imaging, medications, management and patient verbalized understanding.     Charlott Rakes, MD, FAAFP. [facility] [place], [place]   02/11/2019, 4:17 PM

## 2019-02-11 NOTE — Progress Notes (Signed)
Patient has been called and DOB has been verified. Patient has been screened and transferred to PCP to start phone visit.    Patient states that she is having problems with mental health.  Patient is having runny nose and coughing.  Patient needs a letter to return her to work.

## 2019-02-12 ENCOUNTER — Encounter: Payer: Self-pay | Admitting: Family Medicine

## 2019-04-23 ENCOUNTER — Encounter (HOSPITAL_COMMUNITY): Payer: Self-pay | Admitting: Psychiatry

## 2019-04-23 ENCOUNTER — Other Ambulatory Visit: Payer: Self-pay

## 2019-04-23 ENCOUNTER — Ambulatory Visit (INDEPENDENT_AMBULATORY_CARE_PROVIDER_SITE_OTHER): Payer: 59 | Admitting: Psychiatry

## 2019-04-23 DIAGNOSIS — F319 Bipolar disorder, unspecified: Secondary | ICD-10-CM

## 2019-04-23 DIAGNOSIS — F411 Generalized anxiety disorder: Secondary | ICD-10-CM

## 2019-04-23 MED ORDER — TEMAZEPAM 15 MG PO CAPS: 15.0000 mg | ORAL_CAPSULE | Freq: Every day | ORAL | 2 refills | Status: DC

## 2019-04-23 MED ORDER — LURASIDONE HCL 80 MG PO TABS: 80.0000 mg | ORAL_TABLET | Freq: Every day | ORAL | 0 refills | Status: DC

## 2019-04-23 MED ORDER — DULOXETINE HCL 30 MG PO CPEP: 30.0000 mg | ORAL_CAPSULE | Freq: Every day | ORAL | 0 refills | Status: DC

## 2019-04-23 MED ORDER — LAMOTRIGINE 150 MG PO TABS: 150.0000 mg | ORAL_TABLET | Freq: Two times a day (BID) | ORAL | 0 refills | Status: DC

## 2019-04-23 NOTE — Progress Notes (Signed)
Virtual Visit via Telephone Note  I connected with Brandy [name] on 04/23/19 at 10:40 AM EST by telephone and verified that I am speaking with the correct person using two identifiers.   I discussed the limitations, risks, security and privacy concerns of performing an evaluation and management service by telephone and the availability of in person appointments. I also discussed with the patient that there may be a patient responsible charge related to this service. The patient expressed understanding and agreed to proceed.   History of Present Illness: Patient was evaluated by phone session.  On the last visit we increased Lamictal tor 300.  She noticed much improvement in her mood irritability and paranoia.  She no longer hears voices but sometimes does have paranoia.  We have recommended to see a therapist but she did not call to schedule appointment.  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  Patient works at short stay at [employer] but due to limited hours she may asked to move to medical floor.  She is very worried about that.  she had first shot of vaccine and hoping to get a second very soon.  She has no tremors, shakes or any EPS.  She lives with her husband and daughter.  She denies any suicidal thoughts or homicidal thought.  She is compliant with temazepam, Lamictal, Latuda and Cymbalta.  Her sleep is good.    Past Psychiatric History:Reviewed. Hospitalized in 07/03/95 after her mother died. She was treated at [facility] from 1990-2000. She saw Dr. Dina Rich did IOP in 07-03-2015.No history of suicidal attempt. History of paranoia and diagnosed with bipolar daughter and major depression. Had tried Zoloft, Wellbutrin, Rexulti, Klonopin, Abilify, Lexapro, Risperdal and Seroquel.   Psychiatric Specialty Exam: Physical Exam  Review of Systems  There were no vitals taken for this visit.There is no height or weight on file to  calculate BMI.  General Appearance: NA  Eye Contact:  NA  Speech:  Clear and Coherent and Slow  Volume:  Decreased  Mood:  Anxious  Affect:  NA  Thought Process:  Goal Directed  Orientation:  Full (Time, Place, and Person)  Thought Content:  Rumination  Suicidal Thoughts:  No  Homicidal Thoughts:  No  Memory:  Immediate;   Good Recent;   Fair Remote;   Fair  Judgement:  Intact  Insight:  Present  Psychomotor Activity:  NA  Concentration:  Concentration: Fair and Attention Span: Fair  Recall:  Good  Fund of Knowledge:  Good  Language:  Good  Akathisia:  No  Handed:  Right  AIMS (if indicated):     Assets:  Communication Skills Desire for Improvement Housing Resilience  ADL's:  Intact  Cognition:  WNL  Sleep:   fair      Assessment and Plan: Bipolar disorder type I.  Generalized anxiety disorder.  Patient doing better since Lamictal dose increase.  I encouraged she should see a therapist to help her coping skills and she agreed to give a trial.  She has no side effects and I will continue Latuda 80 mg at bedtime, Cymbalta 30 mg daily, Lamictal 300 mg daily and temazepam at bedtime.  Discussed medication side effects and benefits.  Recommended to call us back if she has any question of any concern.  Follow-up if 3 months.  Follow Up Instructions:    I discussed the assessment and treatment plan with the patient. The patient  was provided an opportunity to ask questions and all were answered. The patient agreed with the plan and demonstrated an understanding of the instructions.   The patient was advised to call back or seek an in-person evaluation if the symptoms worsen or if the condition fails to improve as anticipated.  I provided 20 minutes of non-face-to-face time during this encounter.   [name], MD

## 2019-04-26 ENCOUNTER — Other Ambulatory Visit (HOSPITAL_COMMUNITY): Payer: Self-pay | Admitting: *Deleted

## 2019-04-26 DIAGNOSIS — F411 Generalized anxiety disorder: Secondary | ICD-10-CM

## 2019-04-26 MED ORDER — DULOXETINE HCL 30 MG PO CPEP: 30.0000 mg | ORAL_CAPSULE | Freq: Every day | ORAL | 2 refills | Status: DC

## 2019-06-25 ENCOUNTER — Telehealth: Payer: Self-pay | Admitting: Family Medicine

## 2019-06-25 NOTE — Telephone Encounter (Signed)
Will route to PCP for review. 

## 2019-06-25 NOTE — Telephone Encounter (Signed)
Patient called and requested to inform pcp that she has a cough that started yesterday. Patient stated that she has had covid vaccine 1-2 months ago. Patient stated that she thinks she has the flu. She requested for something to be called in for her  To help with the coughing. Please send to [facility] out patient pharmacy. Patient stated she requested for a note for her being out of work today and more then likely for tomorrow too. Please follow up at your earliest convenience.

## 2019-06-26 MED ORDER — BENZONATATE 100 MG PO CAPS: 100.0000 mg | ORAL_CAPSULE | Freq: Two times a day (BID) | ORAL | 0 refills | Status: DC | PRN

## 2019-06-26 NOTE — Telephone Encounter (Signed)
Tessalon Perles sent to pharmacy and letter is ready for pickup.

## 2019-06-26 NOTE — Telephone Encounter (Signed)
Patient called and was informed of note for work and medication at pharmacy. Patient requested to be out of work until Monday 07/01/2019 due to her still having the cough and stated that they would send her home. Please follow up at your earliest convenience.

## 2019-06-26 NOTE — Telephone Encounter (Signed)
Patient was set up with a telephone visit with PCP

## 2019-06-27 ENCOUNTER — Encounter: Payer: Self-pay | Admitting: Family Medicine

## 2019-06-27 ENCOUNTER — Ambulatory Visit: Payer: Self-pay | Attending: Family Medicine | Admitting: Family Medicine

## 2019-06-27 ENCOUNTER — Other Ambulatory Visit: Payer: Self-pay

## 2019-06-27 DIAGNOSIS — J018 Other acute sinusitis: Secondary | ICD-10-CM

## 2019-06-27 MED ORDER — CETIRIZINE HCL 10 MG PO TABS: 10.0000 mg | ORAL_TABLET | Freq: Every day | ORAL | 1 refills | Status: DC

## 2019-06-27 MED ORDER — FLUTICASONE PROPIONATE 50 MCG/ACT NA SUSP: 1.0000 | Freq: Every day | NASAL | 1 refills | Status: DC

## 2019-06-27 NOTE — Progress Notes (Signed)
Virtual Visit via Telephone Note  I connected with Brandy [name], on 06/27/2019 at 9:58 AM by telephone due to the COVID-19 pandemic and verified that I am speaking with the correct person using two identifiers.   Consent: I discussed the limitations, risks, security and privacy concerns of performing an evaluation and management service by telephone and the availability of in person appointments. I also discussed with the patient that there may be a patient responsible charge related to this service. The patient expressed understanding and agreed to proceed.   Location of Patient: Home  Location of Provider: Clinic   Persons participating in Telemedicine visit: Amilyah M Aleen Sells [place]-CMA Dr. Margarita Rana     History of Present Illness: Brandy [name] a 54 year old female with a history of hypertension, bipolar disorder, vertigo, GERD, history of pulmonary edema who comes into the clinic for a follow-up visit.  She has had a cough occurring when she lies down or moves around It occurs when she lays down or is moving around and is productive.  Her daughter had a cough first and hers started 3 days ago; daughter was seen at urgent care and tested negative for COVID-19. Patient had her second COVID-19 shot over a month ago. She has rhinorrhea but no sinus pressure. She had myalgia but this is improving, denies presence of fever, GI symptoms, lethargy or pedal edema or fatigue. Taking Mucinex with no much relief. Picked up [employer] which I had prescribed for her yesterday after she had called requesting a note to be out of work.   If she returns tomorrow to work coughing, she will be sent home and that would be a second occurrence for her so she would like to be out of work until Monday, 07/01/2019 by which time her cough should have resolved. Past Medical History:  Diagnosis Date  . Anxiety   . Depression   . Diverticulitis   . Edema   . Gallstones   .  Hypertension   . Osteoarthritis    Allergies  Allergen Reactions  . Azithromycin Other (See Comments)    "Stomach cramps"  . Contrast Media [Iodinated Diagnostic Agents] Hives  . 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"    Current Outpatient Medications on File Prior to Visit  Medication Sig Dispense Refill  . aspirin EC 81 MG EC tablet Take 1 tablet (81 mg total) by mouth daily.    . benzonatate (TESSALON) 100 MG capsule Take 1 capsule (100 mg total) by mouth 2 (two) times daily as needed for cough. 20 capsule 0  . carvedilol (COREG) 25 MG tablet Take 1 tablet (25 mg total) by mouth 2 (two) times daily with a meal. 180 tablet 1  . DULoxetine (CYMBALTA) 30 MG capsule Take 1 capsule (30 mg total) by mouth daily. 30 capsule 2  . furosemide (LASIX) 20 MG tablet Take 1 tablet (20 mg total) by mouth daily. 90 tablet 1  . hydrochlorothiazide (HYDRODIURIL) 25 MG tablet Take 1 tablet (25 mg total) by mouth daily. 90 tablet 1  . lamoTRIgine (LAMICTAL) 150 MG tablet Take 1 tablet (150 mg total) by mouth 2 (two) times daily. 180 tablet 0  . losartan (COZAAR) 100 MG tablet Take 1 tablet (100 mg total) by mouth daily. 90 tablet 1  . lurasidone (LATUDA) 80 MG TABS tablet Take 1 tablet (80 mg total) by mouth daily with breakfast. 90 tablet 0  . oxybutynin (DITROPAN) 5 MG tablet Take  1 tablet (5 mg total) by mouth 2 (two) times daily. 180 tablet 1  . pantoprazole (PROTONIX) 40 MG tablet Take 1 tablet (40 mg total) by mouth daily. 90 tablet 1  . Potassium Chloride 40 MEQ/15ML (20%) SOLN Take 20 mEq by mouth daily. 225 mL 3  . potassium chloride SA (KLOR-CON) 20 MEQ tablet Take 1 tablet (20 mEq total) by mouth daily. 90 tablet 1  . temazepam (RESTORIL) 15 MG capsule Take 1 capsule (15 mg total) by mouth at bedtime. 30 capsule 2  . amoxicillin-clavulanate (AUGMENTIN) 875-125 MG tablet Take 1 tablet by mouth 2 (two) times daily. (Patient not taking: Reported on 06/27/2019) 20  tablet 0   No current facility-administered medications on file prior to visit.    Observations/Objective: Awake, alert, oriented x3 Not in acute distress Heard coughing with associated sniffles during encounter  Assessment and Plan: 1. Acute non-recurrent sinusitis of other sinus We will place on Flonase and Zyrtec. Continue Tessalon Perles Provided note to be out of work till Monday, 07/01/2019 - fluticasone (FLONASE) 50 MCG/ACT nasal spray; Place 1 spray into both nostrils daily.  Dispense: 16 g; Refill: 1 - cetirizine (ZYRTEC) 10 MG tablet; Take 1 tablet (10 mg total) by mouth daily.  Dispense: 30 tablet; Refill: 1   Follow Up Instructions: Keep previously scheduled appointment for follow-up of medical conditions   I discussed the assessment and treatment plan with the patient. The patient was provided an opportunity to ask questions and all were answered. The patient agreed with the plan and demonstrated an understanding of the instructions.   The patient was advised to call back or seek an in-person evaluation if the symptoms worsen or if the condition fails to improve as anticipated.     I provided 15 minutes total of non-face-to-face time during this encounter including median intraservice time, reviewing previous notes, investigations, ordering medications, medical decision making, coordinating care and patient verbalized understanding at the end of the visit.     Charlott Rakes, MD, FAAFP. [facility] [place], [place]   06/27/2019, 9:58 AM

## 2019-06-27 NOTE — Progress Notes (Signed)
Patient has been called and DOB has been verified. Patient has been screened and transferred to PCP to start phone visit.   Patient has a cough and needs extra time of work.

## 2019-07-22 ENCOUNTER — Encounter (HOSPITAL_COMMUNITY): Payer: Self-pay | Admitting: Psychiatry

## 2019-07-22 ENCOUNTER — Other Ambulatory Visit: Payer: Self-pay

## 2019-07-22 ENCOUNTER — Ambulatory Visit (INDEPENDENT_AMBULATORY_CARE_PROVIDER_SITE_OTHER): Payer: Self-pay | Admitting: Psychiatry

## 2019-07-22 DIAGNOSIS — F5101 Primary insomnia: Secondary | ICD-10-CM

## 2019-07-22 DIAGNOSIS — F319 Bipolar disorder, unspecified: Secondary | ICD-10-CM

## 2019-07-22 DIAGNOSIS — F411 Generalized anxiety disorder: Secondary | ICD-10-CM

## 2019-07-22 MED ORDER — TEMAZEPAM 15 MG PO CAPS
15.0000 mg | ORAL_CAPSULE | Freq: Every day | ORAL | 2 refills | Status: DC
Start: 2019-07-22 — End: 2019-10-25

## 2019-07-22 MED ORDER — LURASIDONE HCL 80 MG PO TABS: 80.0000 mg | ORAL_TABLET | Freq: Every day | ORAL | 0 refills | Status: DC

## 2019-07-22 MED ORDER — DULOXETINE HCL 30 MG PO CPEP: 30.0000 mg | ORAL_CAPSULE | Freq: Every day | ORAL | 0 refills | Status: DC

## 2019-07-22 MED ORDER — LAMOTRIGINE 150 MG PO TABS: 150.0000 mg | ORAL_TABLET | Freq: Two times a day (BID) | ORAL | 0 refills | Status: DC

## 2019-07-22 NOTE — Progress Notes (Signed)
Virtual Visit via Telephone Note  I connected with Brandy [name] on 07/22/19 at 10:40 AM EDT by telephone and verified that I am speaking with the correct person using two identifiers.   I discussed the limitations, risks, security and privacy concerns of performing an evaluation and management service by telephone and the availability of in person appointments. I also discussed with the patient that there may be a patient responsible charge related to this service. The patient expressed understanding and agreed to proceed.   History of Present Illness: Patient was evaluated by phone session.  She is doing better on her current medication.  Her sleep is also better with temazepam.  She denies any recent irritability, anger or any severe mood swings.  She admitted some time anxiety and we have recommended to see a therapist but she is not able to make appointment with therapist.  She works at short stay at [employer].  She wants to keep her current medication but is helping her mood anxiety and sleep.  She has no tremors shakes or any EPS.  Her appetite is okay.  She admitted some weight gain from the past but did not have exact numbers.    Past Psychiatric History:Reviewed. Hospitalized in 06/30/95 after her mother died. She was treated at [facility] from 1990-2000. She saw Dr. Dina Rich did IOP in 2015/06/30.No history of suicidal attempt. History of paranoia and diagnosed with bipolar daughter and major depression. Had tried Zoloft, Wellbutrin, Rexulti, Klonopin, Abilify, Lexapro, Risperdal and Seroquel   Psychiatric Specialty Exam: Physical Exam  Review of Systems  There were no vitals taken for this visit.There is no height or weight on file to calculate BMI.  General Appearance: NA  Eye Contact:  NA  Speech:  Clear and Coherent  Volume:  Decreased  Mood:  Euthymic  Affect:  NA  Thought Process:  Goal Directed  Orientation:  Full (Time, Place, and Person)  Thought Content:   Rumination  Suicidal Thoughts:  No  Homicidal Thoughts:  No  Memory:  Immediate;   Good Recent;   Fair Remote;   Fair  Judgement:  Fair  Insight:  Present  Psychomotor Activity:  NA  Concentration:  Concentration: Fair and Attention Span: Fair  Recall:  [employer] of Knowledge:  Fair  Language:  Good  Akathisia:  No  Handed:  Right  AIMS (if indicated):     Assets:  Communication Skills Desire for Improvement Housing Social Support Transportation  ADL's:  Intact  Cognition:  WNL  Sleep:   Good      Assessment and Plan: Bipolar disorder type I.  Generalized anxiety disorder.  Primary insomnia.  Patient doing better on her medication.  Reinforced therapy and patient promised that she will call to get appointment.  She is to see therapist [name] in the past.  She has no rash or any itching.  Continue Cymbalta 60 mg daily, Lamictal 300 mg daily, Latuda 80 mg at bedtime and temazepam 15 mg to help sleep.  Discussed medication side effects and benefits.  Recommended to call us back if she is any question or any concern.  Follow-up in 3 months.  Follow Up Instructions:    I discussed the assessment and treatment plan with the patient. The patient was provided an opportunity to ask questions and all were answered. The patient agreed with the plan and demonstrated an understanding of the instructions.   The patient was advised to call back or seek an in-person evaluation if  the symptoms worsen or if the condition fails to improve as anticipated.  I provided 15 minutes of non-face-to-face time during this encounter.   [name], MD

## 2019-08-08 DIAGNOSIS — D473 Essential (hemorrhagic) thrombocythemia: Secondary | ICD-10-CM | POA: Diagnosis not present

## 2019-09-16 ENCOUNTER — Ambulatory Visit
Admission: EM | Admit: 2019-09-16 | Discharge: 2019-09-16 | Disposition: A | Discharge status: Home / Self Care | Payer: PPO | Attending: Emergency Medicine | Admitting: Emergency Medicine

## 2019-09-16 ENCOUNTER — Other Ambulatory Visit: Payer: Self-pay

## 2019-09-16 DIAGNOSIS — J069 Acute upper respiratory infection, unspecified: Secondary | ICD-10-CM

## 2019-09-16 DIAGNOSIS — R5381 Other malaise: Secondary | ICD-10-CM

## 2019-09-16 DIAGNOSIS — J018 Other acute sinusitis: Secondary | ICD-10-CM

## 2019-09-16 DIAGNOSIS — M791 Myalgia, unspecified site: Secondary | ICD-10-CM

## 2019-09-16 MED ORDER — FLUTICASONE PROPIONATE 50 MCG/ACT NA SUSP: 1.0000 | Freq: Every day | NASAL | 1 refills | Status: DC

## 2019-09-16 MED ORDER — BENZONATATE 100 MG PO CAPS: 100.0000 mg | ORAL_CAPSULE | Freq: Three times a day (TID) | ORAL | 0 refills | Status: DC

## 2019-09-16 MED ORDER — AEROCHAMBER PLUS FLO-VU MEDIUM MISC: 1.0000 | Freq: Once | 0 refills | Status: AC

## 2019-09-16 MED ORDER — ALBUTEROL SULFATE HFA 108 (90 BASE) MCG/ACT IN AERS: 2.0000 | INHALATION_SPRAY | RESPIRATORY_TRACT | 0 refills | Status: AC | PRN

## 2019-09-16 MED ORDER — CETIRIZINE HCL 10 MG PO TABS: 10.0000 mg | ORAL_TABLET | Freq: Every day | ORAL | 1 refills | Status: DC

## 2019-09-16 NOTE — ED Triage Notes (Addendum)
Cough, runny, headache, chills x 2 days. Multiple family members with same symptoms. Pt fully COVID vaccinated in February [facility]), patient refused COVID testing at triage.   Hypertensive at triage, has not taken medication today.

## 2019-09-16 NOTE — ED Provider Notes (Signed)
[facility]    CSN: [id number] Arrival date & time: 09/16/19  1216      History   Chief Complaint Chief Complaint  Patient presents with  . Cough    HPI Brandy [name] is a 54 y.o. female with history of OA, hypertension, obesity presenting for URI symptoms x2 days.  Patient endorsing dry cough, rhinorrhea, generalized headache and chills with myalgias.  Patient states that her been multiple people at home have been sick as well: No known Covid contacts.  Patient does work in the hospital setting, though received both Pfizer vaccines in February 2021.  Patient's blood pressure is elevated: Has not taken her blood pressure medications yet today.  Denies decongestant use at home.  Denies chest pain, abdominal pain, shortness of breath, lower extremity edema.  Past Medical History:  Diagnosis Date  . Anxiety   . Depression   . Diverticulitis   . Edema   . Gallstones   . Hypertension   . Osteoarthritis     Patient Active Problem List   Diagnosis Date Noted  . Obesity (BMI 35.0-39.9 without comorbidity) 10/24/2016  . Bipolar disorder ([place]) 04/29/2015  . Insomnia 03/13/2015  . Major depression, recurrent ([place]) 03/10/2015  . Generalized anxiety disorder 03/10/2015  . Dizziness and giddiness 02/20/2015  . GERD (gastroesophageal reflux disease) 02/20/2015  . Abnormal CXR   . Dyspnea 02/02/2015  . Hypoxia 02/02/2015  . Acute pulmonary edema ([place]) 02/02/2015  . Hypertensive urgency 02/02/2015  . Hypertensive crisis   . Nausea with vomiting 06/09/2011  . HTN (hypertension) 12/02/2010  . Diverticulitis of sigmoid colon 12/02/2010  . Tobacco dependence 12/02/2010    Past Surgical History:  Procedure Laterality Date  . GALLBLADDER SURGERY      OB History   No obstetric history on file.      Home Medications    Prior to Admission medications   Medication Sig Start Date End Date Taking? Authorizing Provider  albuterol (VENTOLIN HFA) 108 (90 Base)  MCG/ACT inhaler Inhale 2 puffs into the lungs every 4 (four) hours as needed for wheezing or shortness of breath. 09/16/19   Hall-Potvin, [place], PA-C  aspirin EC 81 MG EC tablet Take 1 tablet (81 mg total) by mouth daily. 02/05/15   Domenic Polite, MD  benzonatate (TESSALON) 100 MG capsule Take 1 capsule (100 mg total) by mouth every 8 (eight) hours. 09/16/19   Hall-Potvin, [place], PA-C  carvedilol (COREG) 25 MG tablet Take 1 tablet (25 mg total) by mouth 2 (two) times daily with a meal. 02/11/19   Charlott Rakes, MD  cetirizine (ZYRTEC) 10 MG tablet Take 1 tablet (10 mg total) by mouth daily. 09/16/19   Hall-Potvin, [place], PA-C  DULoxetine (CYMBALTA) 30 MG capsule Take 1 capsule (30 mg total) by mouth daily. 07/22/19 07/21/20  Arfeen, Arlyce Harman, MD  fluticasone (FLONASE) 50 MCG/ACT nasal spray Place 1 spray into both nostrils daily. 09/16/19   Hall-Potvin, [place], PA-C  furosemide (LASIX) 20 MG tablet Take 1 tablet (20 mg total) by mouth daily. 02/11/19   Charlott Rakes, MD  hydrochlorothiazide (HYDRODIURIL) 25 MG tablet Take 1 tablet (25 mg total) by mouth daily. 02/11/19   Charlott Rakes, MD  lamoTRIgine (LAMICTAL) 150 MG tablet Take 1 tablet (150 mg total) by mouth 2 (two) times daily. 07/22/19 07/21/20  Arfeen, Arlyce Harman, MD  losartan (COZAAR) 100 MG tablet Take 1 tablet (100 mg total) by mouth daily. 02/11/19   Charlott Rakes, MD  oxybutynin (DITROPAN) 5 MG tablet Take  1 tablet (5 mg total) by mouth 2 (two) times daily. 02/11/19   Charlott Rakes, MD  pantoprazole (PROTONIX) 40 MG tablet Take 1 tablet (40 mg total) by mouth daily. 02/11/19   Charlott Rakes, MD  Potassium Chloride 40 MEQ/15ML (20%) SOLN Take 20 mEq by mouth daily. 10/03/17   Charlott Rakes, MD  potassium chloride SA (KLOR-CON) 20 MEQ tablet Take 1 tablet (20 mEq total) by mouth daily. 02/11/19   Charlott Rakes, MD  Spacer/Aero-Holding Chambers (AEROCHAMBER PLUS FLO-VU MEDIUM) MISC 1 each by Other route once for 1 dose. 09/16/19 09/16/19   Hall-Potvin, [place], PA-C  temazepam (RESTORIL) 15 MG capsule Take 1 capsule (15 mg total) by mouth at bedtime. 07/22/19   Arfeen, Arlyce Harman, MD    Family History Family History  Problem Relation Age of Onset  . Alcohol abuse Mother   . Schizophrenia Maternal Grandmother   . Coronary artery disease Paternal Grandmother        young age  . Diabetes Paternal Grandmother   . Hypertension Father   . Diabetes Sister   . Schizophrenia Maternal Aunt     Social History Social History   Tobacco Use  . Smoking status: Current Every Day Smoker    Packs/day: 0.50    Years: 1.50    Pack years: 0.75    Types: Cigarettes  . Smokeless tobacco: Never Used  Vaping Use  . Vaping Use: Never used  Substance Use Topics  . Alcohol use: Yes    Alcohol/week: 2.0 standard drinks    Types: 1 Glasses of wine, 1 Cans of beer per week    Comment: occasionaly  . Drug use: Yes    Types: Marijuana    Comment: stopped a couple of weeks ago     Allergies   Azithromycin, Contrast media [iodinated diagnostic agents], and Shellfish allergy   Review of Systems As per HPI   Physical Exam Triage Vital Signs ED Triage Vitals  Enc Vitals Group     BP      Pulse      Resp      Temp      Temp src      SpO2      Weight      Height      Head Circumference      Peak Flow      Pain Score      Pain Loc      Pain Edu?      Excl. in [place]?    No data found.  Updated Vital Signs BP (!) 178/119   Pulse 78   Temp 98.2 F (36.8 C)   Resp 18   SpO2 98%   Visual Acuity Right Eye Distance:   Left Eye Distance:   Bilateral Distance:    Right Eye Near:   Left Eye Near:    Bilateral Near:     Physical Exam Constitutional:      General: She is not in acute distress.    Appearance: She is obese. She is ill-appearing. She is not toxic-appearing or diaphoretic.  HENT:     Head: Normocephalic and atraumatic.     Right Ear: Tympanic membrane, ear canal and external ear normal.     Left Ear:  Tympanic membrane and ear canal normal.     Nose: Rhinorrhea present.     Mouth/Throat:     Mouth: Mucous membranes are moist.     Pharynx: Oropharynx is clear. No oropharyngeal exudate or posterior oropharyngeal  erythema.  Eyes:     General: No scleral icterus.    Conjunctiva/sclera: Conjunctivae normal.     Pupils: Pupils are equal, round, and reactive to light.  Neck:     Comments: Trachea midline, negative JVD Cardiovascular:     Rate and Rhythm: Normal rate and regular rhythm.     Heart sounds: No murmur heard.  No gallop.   Pulmonary:     Effort: Pulmonary effort is normal. No respiratory distress.     Breath sounds: No wheezing, rhonchi or rales.  Musculoskeletal:     Cervical back: Neck supple. No tenderness.     Right lower leg: No edema.     Left lower leg: No edema.  Lymphadenopathy:     Cervical: No cervical adenopathy.  Skin:    Capillary Refill: Capillary refill takes less than 2 seconds.     Coloration: Skin is not jaundiced or pale.     Findings: No rash.  Neurological:     General: No focal deficit present.     Mental Status: She is alert and oriented to person, place, and time.      UC Treatments / Results  Labs (all labs ordered are listed, but only abnormal results are displayed) Labs Reviewed  NOVEL CORONAVIRUS, NAA    EKG   Radiology No results found.  Procedures Procedures (including critical care time)  Medications Ordered in UC Medications - No data to display  Initial Impression / Assessment and Plan / UC Course  I have reviewed the triage vital signs and the nursing notes.  Pertinent labs & imaging results that were available during my care of the patient were reviewed by me and considered in my medical decision making (see chart for details).     Patient afebrile, nontoxic, with SpO2 98%.  Covid PCR pending.  Patient to quarantine until results are back.  We will treat supportively as outlined below.  Return precautions  discussed, patient verbalized understanding and is agreeable to plan. Final Clinical Impressions(s) / UC Diagnoses   Final diagnoses:  URI with cough and congestion  Myalgia  Malaise     Discharge Instructions     Tessalon for cough. Start flonase, atrovent nasal spray for nasal congestion/drainage. You can use over the counter nasal saline rinse such as neti pot for nasal congestion. Keep hydrated, your urine should be clear to pale yellow in color. Tylenol/motrin for fever and pain. Monitor for any worsening of symptoms, chest pain, shortness of breath, wheezing, swelling of the throat, go to the emergency department for further evaluation needed.   [place] [facility] phone number: [phone]    ED Prescriptions    Medication Sig Dispense Auth. Provider   cetirizine (ZYRTEC) 10 MG tablet Take 1 tablet (10 mg total) by mouth daily. 30 tablet Hall-Potvin, [place], PA-C   fluticasone (FLONASE) 50 MCG/ACT nasal spray Place 1 spray into both nostrils daily. 16 g Hall-Potvin, [place], PA-C   benzonatate (TESSALON) 100 MG capsule Take 1 capsule (100 mg total) by mouth every 8 (eight) hours. 21 capsule Hall-Potvin, [place], PA-C   albuterol (VENTOLIN HFA) 108 (90 Base) MCG/ACT inhaler Inhale 2 puffs into the lungs every 4 (four) hours as needed for wheezing or shortness of breath. 18 g Hall-Potvin, [place], PA-C   Spacer/Aero-Holding Chambers (AEROCHAMBER PLUS FLO-VU MEDIUM) MISC 1 each by Other route once for 1 dose. 1 each Hall-Potvin, [place], PA-C     PDMP not reviewed this encounter.   Hall-Potvin, [place], [place] 09/16/19 1331

## 2019-09-16 NOTE — Discharge Instructions (Addendum)
Tessalon for cough. Start flonase, atrovent nasal spray for nasal congestion/drainage. You can use over the counter nasal saline rinse such as neti pot for nasal congestion. Keep hydrated, your urine should be clear to pale yellow in color. Tylenol/motrin for fever and pain. Monitor for any worsening of symptoms, chest pain, shortness of breath, wheezing, swelling of the throat, go to the emergency department for further evaluation needed.   [facility] [employer] phone number: [phone]

## 2019-09-17 LAB — NOVEL CORONAVIRUS, NAA: SARS-CoV-2, NAA: NOT DETECTED

## 2019-09-17 LAB — SARS-COV-2, NAA 2 DAY TAT

## 2019-10-25 ENCOUNTER — Other Ambulatory Visit: Payer: Self-pay

## 2019-10-25 ENCOUNTER — Encounter (HOSPITAL_COMMUNITY): Payer: Self-pay | Admitting: Psychiatry

## 2019-10-25 ENCOUNTER — Telehealth (INDEPENDENT_AMBULATORY_CARE_PROVIDER_SITE_OTHER): Payer: PPO | Admitting: Psychiatry

## 2019-10-25 VITALS — Wt 180.0 lb

## 2019-10-25 DIAGNOSIS — F5101 Primary insomnia: Secondary | ICD-10-CM | POA: Diagnosis not present

## 2019-10-25 DIAGNOSIS — F411 Generalized anxiety disorder: Secondary | ICD-10-CM | POA: Diagnosis not present

## 2019-10-25 DIAGNOSIS — F319 Bipolar disorder, unspecified: Secondary | ICD-10-CM | POA: Diagnosis not present

## 2019-10-25 MED ORDER — TEMAZEPAM 15 MG PO CAPS: 15.0000 mg | ORAL_CAPSULE | Freq: Every day | ORAL | 2 refills | Status: DC

## 2019-10-25 MED ORDER — LURASIDONE HCL 80 MG PO TABS: 80.0000 mg | ORAL_TABLET | Freq: Every day | ORAL | 0 refills | Status: DC

## 2019-10-25 MED ORDER — LAMOTRIGINE 150 MG PO TABS: 150.0000 mg | ORAL_TABLET | Freq: Two times a day (BID) | ORAL | 0 refills | Status: DC

## 2019-10-25 MED ORDER — DULOXETINE HCL 30 MG PO CPEP: 30.0000 mg | ORAL_CAPSULE | Freq: Every day | ORAL | 0 refills | Status: DC

## 2019-10-25 NOTE — Progress Notes (Signed)
Virtual Visit via Telephone Note  I connected with Brandy [name] on 10/25/19 at 10:40 AM EDT by telephone and verified that I am speaking with the correct person using two identifiers.  Location: Patient: home Provider: home office   I discussed the limitations, risks, security and privacy concerns of performing an evaluation and management service by telephone and the availability of in person appointments. I also discussed with the patient that there may be a patient responsible charge related to this service. The patient expressed understanding and agreed to proceed.   History of Present Illness: Patient is evaluated by phone session.  She is compliant with the medication.  She is sleeping better with temazepam.  She has not able to find her previous therapist Ceasar Lund and noticed sometimes irritable and angry.  She denies any mania, psychosis, hallucination or any suicidal thoughts.  She worried about recent spike in [place] cases and she started wearing mask at work.  She works at [employer] stay at [facility].  She works 1 week 16 hours in another week 20 hours.  Recently she seen in urgent care for cough and given antibiotics and cough medicine which she finished.  Her appetite is okay.  She denies any panic attack or any nervousness.  She is getting at least 6 to 8 hours sleep.  She is compliant with Cymbalta, Latuda, Lamictal and temazepam.  She has no rash, itching, tremors or shakes.  She denies drinking or using any illegal substances.     Past Psychiatric History:Reviewed. Hospitalized in 06-29-1995 after her mother died. She was treated at [facility] from 1990-2000. She saw Dr. Dina Rich did IOP in 06/29/2015.No history of suicidal attempt. History of paranoia and diagnosed with bipolar daughter and major depression. Had tried Zoloft, Wellbutrin, Rexulti, Klonopin, Abilify, Lexapro, Risperdal and Seroquel   Psychiatric Specialty Exam: Physical Exam  Review of Systems  There were  no vitals taken for this visit.There is no height or weight on file to calculate BMI.  General Appearance: NA  Eye Contact:  NA  Speech:  Clear and Coherent  Volume:  Normal  Mood:  Anxious  Affect:  NA  Thought Process:  Goal Directed  Orientation:  Full (Time, Place, and Person)  Thought Content:  Logical  Suicidal Thoughts:  No  Homicidal Thoughts:  No  Memory:  Immediate;   Good Recent;   Good Remote;   Good  Judgement:  Good  Insight:  Present  Psychomotor Activity:  NA  Concentration:  Concentration: Good and Attention Span: Fair  Recall:  Good  Fund of Knowledge:  Good  Language:  Good  Akathisia:  No  Handed:  Right  AIMS (if indicated):     Assets:  Communication Skills Desire for Improvement Housing Resilience Social Support Transportation  ADL's:  Intact  Cognition:  WNL  Sleep:   ok      Assessment and Plan: Bipolar disorder type I.  Generalized anxiety disorder.  Primary insomnia.  Reassurance given.  Recommend to see a therapist and we will provide names as patient like to resume therapy.  She is somewhat concerned and worried about increased COVID cases.  Discussed medication side effects and benefits.  She has no rash, itching tremors or shakes.  Continue Cymbalta 30 mg daily, Lamictal 30 mg daily, Latuda 80 mg at bedtime and temazepam 15 mg at bedtime.  Recommended to call us back if she has any question or any concern.  Follow-up in 3 months.  Follow Up Instructions:  I discussed the assessment and treatment plan with the patient. The patient was provided an opportunity to ask questions and all were answered. The patient agreed with the plan and demonstrated an understanding of the instructions.   The patient was advised to call back or seek an in-person evaluation if the symptoms worsen or if the condition fails to improve as anticipated.  I provided 15 minutes of non-face-to-face time during this encounter.   [name], MD

## 2019-11-05 ENCOUNTER — Ambulatory Visit (HOSPITAL_COMMUNITY): Payer: PPO | Admitting: Licensed Clinical Social Worker

## 2019-11-05 ENCOUNTER — Other Ambulatory Visit: Payer: Self-pay

## 2019-11-05 ENCOUNTER — Other Ambulatory Visit: Payer: Self-pay | Admitting: Family Medicine

## 2019-11-05 ENCOUNTER — Telehealth (HOSPITAL_COMMUNITY): Payer: Self-pay | Admitting: Licensed Clinical Social Worker

## 2019-11-05 DIAGNOSIS — I10 Essential (primary) hypertension: Secondary | ICD-10-CM

## 2019-11-05 DIAGNOSIS — K219 Gastro-esophageal reflux disease without esophagitis: Secondary | ICD-10-CM

## 2019-11-05 DIAGNOSIS — R600 Localized edema: Secondary | ICD-10-CM

## 2019-11-05 MED ORDER — PANTOPRAZOLE SODIUM 40 MG PO TBEC: 40.0000 mg | DELAYED_RELEASE_TABLET | Freq: Every day | ORAL | 0 refills | Status: DC

## 2019-11-05 MED ORDER — CARVEDILOL 25 MG PO TABS: 25.0000 mg | ORAL_TABLET | Freq: Two times a day (BID) | ORAL | 0 refills | Status: DC

## 2019-11-05 NOTE — Telephone Encounter (Signed)
Brandy [name] had a clinical assessment scheduled on this date at 3pm with new therapist.  Clinician previously contacted [place] by phone on 11/01/19 at 8:27am and left voicemail reminding of appointment and asked for her to call back regarding preference for in-person or virtual setting.  Brandy [name] did not call back, so clinician sent virtual invite through [place] application via email and text at 3pm today.  When Brandy [name] did not present virtually or in person by 3:10pm, clinician attempted contact by phone again, but received busy signal and could not leave message.  Clinician ended virtual meeting at 3:15pm when Brandy [name] had still not appeared, and informed front desk of no-show event.    Shade Flood, LCSW, LCAS 11/05/19

## 2019-11-05 NOTE — Telephone Encounter (Signed)
carvedilol (COREG) 25 MG tablet furosemide (LASIX) 20 MG tablet  hydrochlorothiazide (HYDRODIURIL) 25 MG tablet  losartan (COZAAR) 100 MG tablet pantoprazole (PROTONIX) 40 MG tablet potassium chloride SA (KLOR-CON) 20 MEQ tablet     Patient is requesting refill.    Pharmacy:  [place], [place] - [address] Phone:  [phone]  Fax:  [phone]

## 2019-11-05 NOTE — Telephone Encounter (Signed)
Requested medication (s) are due for refill today: yes  Requested medication (s) are on the active medication list: yes  Last refill:  02/11/19 #90 with 1 refill  Future visit scheduled: no  Notes to clinic:  Please review for refill. Unable to refill per protocol. Last Cr and K level noted to be on 12/16/ 2019.     Requested Prescriptions  Pending Prescriptions Disp Refills   furosemide (LASIX) 20 MG tablet 90 tablet 1    Sig: Take 1 tablet (20 mg total) by mouth daily.      Cardiovascular:  Diuretics - Loop Failed - 11/05/2019  4:53 PM      Failed - K in normal range and within 360 days    Potassium  Date Value Ref Range Status  03/19/2018 3.9 3.5 - 5.2 mmol/L Final          Failed - Ca in normal range and within 360 days    Calcium  Date Value Ref Range Status  03/19/2018 10.1 8.7 - 10.2 mg/dL Final          Failed - Na in normal range and within 360 days    Sodium  Date Value Ref Range Status  03/19/2018 140 134 - 144 mmol/L Final          Failed - Cr in normal range and within 360 days    Creat  Date Value Ref Range Status  04/29/2015 0.93 0.50 - 1.10 mg/dL Final   Creatinine, Ser  Date Value Ref Range Status  03/19/2018 1.01 (H) 0.57 - 1.00 mg/dL Final          Failed - Last BP in normal range    BP Readings from Last 1 Encounters:  09/16/19 (!) 178/119          Passed - Valid encounter within last 6 months    Recent Outpatient Visits           4 months ago Acute non-recurrent sinusitis of other sinus   [place], [place], MD   8 months ago Acute non-recurrent sinusitis of other sinus   [facility] [place], Charlane Ferretti, MD   1 year ago Health care maintenance   [place], Charlane Ferretti, MD   2 years ago Bipolar disorder, current episode mixed, moderate ([place])   [place], [place], MD   3 years ago Bipolar  disorder, current episode mixed, moderate ([place])   [place], [place], MD                hydrochlorothiazide (HYDRODIURIL) 25 MG tablet 90 tablet 1    Sig: Take 1 tablet (25 mg total) by mouth daily.      Cardiovascular: Diuretics - Thiazide Failed - 11/05/2019  4:53 PM      Failed - Ca in normal range and within 360 days    Calcium  Date Value Ref Range Status  03/19/2018 10.1 8.7 - 10.2 mg/dL Final          Failed - Cr in normal range and within 360 days    Creat  Date Value Ref Range Status  04/29/2015 0.93 0.50 - 1.10 mg/dL Final   Creatinine, Ser  Date Value Ref Range Status  03/19/2018 1.01 (H) 0.57 - 1.00 mg/dL Final          Failed - K in normal range and within 360  days    Potassium  Date Value Ref Range Status  03/19/2018 3.9 3.5 - 5.2 mmol/L Final          Failed - Na in normal range and within 360 days    Sodium  Date Value Ref Range Status  03/19/2018 140 134 - 144 mmol/L Final          Failed - Last BP in normal range    BP Readings from Last 1 Encounters:  09/16/19 (!) 178/119          Passed - Valid encounter within last 6 months    Recent Outpatient Visits           4 months ago Acute non-recurrent sinusitis of other sinus   [place], [place], MD   8 months ago Acute non-recurrent sinusitis of other sinus   [facility] [place], Charlane Ferretti, MD   1 year ago Health care maintenance   [place], Charlane Ferretti, MD   2 years ago Bipolar disorder, current episode mixed, moderate ([place])   [place], Charlane Ferretti, MD   3 years ago Bipolar disorder, current episode mixed, moderate ([place])   [place], Charlane Ferretti, MD                losartan (COZAAR) 100 MG tablet 90 tablet 1    Sig: Take 1 tablet (100 mg total) by mouth daily.       Cardiovascular:  Angiotensin Receptor Blockers Failed - 11/05/2019  4:53 PM      Failed - Cr in normal range and within 180 days    Creat  Date Value Ref Range Status  04/29/2015 0.93 0.50 - 1.10 mg/dL Final   Creatinine, Ser  Date Value Ref Range Status  03/19/2018 1.01 (H) 0.57 - 1.00 mg/dL Final          Failed - K in normal range and within 180 days    Potassium  Date Value Ref Range Status  03/19/2018 3.9 3.5 - 5.2 mmol/L Final          Failed - Last BP in normal range    BP Readings from Last 1 Encounters:  09/16/19 (!) 178/119          Passed - Patient is not pregnant      Passed - Valid encounter within last 6 months    Recent Outpatient Visits           4 months ago Acute non-recurrent sinusitis of other sinus   [place], Brenda, MD   8 months ago Acute non-recurrent sinusitis of other sinus   [facility] [place], Charlane Ferretti, MD   1 year ago Health care maintenance   [place], Charlane Ferretti, MD   2 years ago Bipolar disorder, current episode mixed, moderate ([place])   [place], [place], MD   3 years ago Bipolar disorder, current episode mixed, moderate ([place])   [place], [place], MD                potassium chloride SA (KLOR-CON) 20 MEQ tablet 90 tablet 1    Sig: Take 1 tablet (20 mEq total) by mouth daily.      Endocrinology:  Minerals - Potassium Supplementation Failed - 11/05/2019  4:53 PM      Failed - K in normal range and within 360 days    Potassium  Date Value Ref Range Status  03/19/2018 3.9 3.5 - 5.2 mmol/L Final          Failed - Cr in normal range and within 360 days    Creat  Date Value Ref Range Status  04/29/2015 0.93 0.50 - 1.10 mg/dL Final   Creatinine, Ser  Date Value Ref Range Status  03/19/2018 1.01 (H) 0.57 - 1.00 mg/dL Final          Passed -  Valid encounter within last 12 months    Recent Outpatient Visits           4 months ago Acute non-recurrent sinusitis of other sinus   [place], [place], MD   8 months ago Acute non-recurrent sinusitis of other sinus   [facility] [place], [place], MD   1 year ago Health care maintenance   [place], Charlane Ferretti, MD   2 years ago Bipolar disorder, current episode mixed, moderate ([place])   [place], Charlane Ferretti, MD   3 years ago Bipolar disorder, current episode mixed, moderate ([place])   [place], Charlane Ferretti, MD               Signed Prescriptions Disp Refills   carvedilol (COREG) 25 MG tablet 180 tablet 0    Sig: Take 1 tablet (25 mg total) by mouth 2 (two) times daily with a meal.      Cardiovascular:  Beta Blockers Failed - 11/05/2019  4:53 PM      Failed - Last BP in normal range    BP Readings from Last 1 Encounters:  09/16/19 (!) 178/119          Passed - Last Heart Rate in normal range    Pulse Readings from Last 1 Encounters:  09/16/19 78          Passed - Valid encounter within last 6 months    Recent Outpatient Visits           4 months ago Acute non-recurrent sinusitis of other sinus   [place], [place], MD   8 months ago Acute non-recurrent sinusitis of other sinus   [facility] [place], Charlane Ferretti, MD   1 year ago Health care maintenance   [place], Charlane Ferretti, MD   2 years ago Bipolar disorder, current episode mixed, moderate ([place])   [place], [place], MD   3 years ago Bipolar disorder, current episode mixed, moderate ([place])   [place], [place], MD                pantoprazole (PROTONIX) 40 MG  tablet 90 tablet 0    Sig: Take 1 tablet (40 mg total) by mouth daily.      Gastroenterology: Proton Pump Inhibitors Passed - 11/05/2019  4:53 PM      Passed - Valid encounter within last 12 months    Recent Outpatient Visits           4 months ago Acute non-recurrent sinusitis of other sinus   [place], Charlane Ferretti, MD   8 months ago Acute non-recurrent sinusitis of other sinus  [place] Charlott Rakes, MD   1 year ago Health care [place], Charlane Ferretti, MD   2 years ago Bipolar disorder, current episode mixed, moderate [facility])   [place], Charlane Ferretti, MD   3 years ago Bipolar disorder, current episode mixed, moderate ([place])   [facility] Charlott Rakes, MD

## 2019-11-06 NOTE — Telephone Encounter (Signed)
Patient has not been in for labs since 03/2018. Will route request to PCP.

## 2019-11-12 ENCOUNTER — Other Ambulatory Visit: Payer: Self-pay | Admitting: Family Medicine

## 2019-11-12 DIAGNOSIS — I10 Essential (primary) hypertension: Secondary | ICD-10-CM

## 2020-01-02 ENCOUNTER — Encounter (HOSPITAL_COMMUNITY): Payer: Self-pay

## 2020-01-24 ENCOUNTER — Encounter (HOSPITAL_COMMUNITY): Payer: PPO | Admitting: Psychiatry

## 2020-01-24 ENCOUNTER — Other Ambulatory Visit: Payer: Self-pay

## 2020-01-24 NOTE — Progress Notes (Signed)
No show

## 2020-01-27 ENCOUNTER — Encounter (HOSPITAL_COMMUNITY): Payer: Self-pay | Admitting: Psychiatry

## 2020-01-27 ENCOUNTER — Other Ambulatory Visit: Payer: Self-pay

## 2020-01-27 ENCOUNTER — Telehealth (INDEPENDENT_AMBULATORY_CARE_PROVIDER_SITE_OTHER): Payer: PPO | Admitting: Psychiatry

## 2020-01-27 ENCOUNTER — Other Ambulatory Visit (HOSPITAL_COMMUNITY): Payer: Self-pay | Admitting: Psychiatry

## 2020-01-27 VITALS — Wt 180.0 lb

## 2020-01-27 DIAGNOSIS — F319 Bipolar disorder, unspecified: Secondary | ICD-10-CM

## 2020-01-27 DIAGNOSIS — F411 Generalized anxiety disorder: Secondary | ICD-10-CM | POA: Diagnosis not present

## 2020-01-27 DIAGNOSIS — F5101 Primary insomnia: Secondary | ICD-10-CM | POA: Diagnosis not present

## 2020-01-27 MED ORDER — LURASIDONE HCL 80 MG PO TABS: 80.0000 mg | ORAL_TABLET | Freq: Every day | ORAL | 0 refills | Status: DC

## 2020-01-27 MED ORDER — ESZOPICLONE 2 MG PO TABS: 2.0000 mg | ORAL_TABLET | Freq: Every evening | ORAL | 2 refills | Status: DC | PRN

## 2020-01-27 MED ORDER — LAMOTRIGINE 150 MG PO TABS: 150.0000 mg | ORAL_TABLET | Freq: Two times a day (BID) | ORAL | 0 refills | Status: DC

## 2020-01-27 MED ORDER — DULOXETINE HCL 30 MG PO CPEP: 30.0000 mg | ORAL_CAPSULE | Freq: Every day | ORAL | 0 refills | Status: DC

## 2020-01-27 NOTE — Progress Notes (Signed)
Virtual Visit via Telephone Note  I connected with Brandy [name] on 01/27/20 at 11:40 AM EDT by telephone and verified that I am speaking with the correct person using two identifiers.  Location: Patient: home Provider: home office   I discussed the limitations, risks, security and privacy concerns of performing an evaluation and management service by telephone and the availability of in person appointments. I also discussed with the patient that there may be a patient responsible charge related to this service. The patient expressed understanding and agreed to proceed.   History of Present Illness: Patient is evaluated by phone session.  She had missed appointment with Brandy [name].  She is still struggling with sleep and some nights she has difficulty staying asleep.  She does not feel temazepam is working.  She is working part-time at [employer].  Her job is sometimes overwhelming.  She denies any panic attack or any nervousness.  She is compliant with Cymbalta, Lamictal, Latuda and denies any rash, itching tremors or shakes.  Her energy level is fair.  She has not been on the scale because she is afraid as she believes that she is gaining weight.  She has been very sick and given antibiotic for the cough.  She like to try a different medication for her sleep.  She feels Lamictal and other medicines are working well for her mood swings and she denies any mania, psychosis or any hallucination.  She denies any panic attack.  Past Psychiatric History:Reviewed. H/O inpatient in Jun 30, 1995 after mother died. Seen at [facility] from 1990-2000,  She saw Dr. Dina Rich did IOP in 06/30/2015.No history of suicidal attempt. History of paranoia and diagnosed with bipolar daughter and major depression. Had tried Zoloft, Wellbutrin, Rexulti, Klonopin, Abilify, Lexapro, Risperdal and Seroquel   Psychiatric Specialty Exam: Physical Exam  Review of Systems  There were no vitals taken for this visit.There  is no height or weight on file to calculate BMI.  General Appearance: NA  Eye Contact:  NA  Speech:  Slow  Volume:  Decreased  Mood:  Dysphoric  Affect:  NA  Thought Process:  Goal Directed  Orientation:  Full (Time, Place, and Person)  Thought Content:  Logical  Suicidal Thoughts:  No  Homicidal Thoughts:  No  Memory:  Immediate;   Good Recent;   Good Remote;   Good  Judgement:  Intact  Insight:  Present  Psychomotor Activity:  NA  Concentration:  Concentration: Fair and Attention Span: Fair  Recall:  Good  Fund of Knowledge:  Good  Language:  Good  Akathisia:  No  Handed:  Right  AIMS (if indicated):     Assets:  Communication Skills Desire for Improvement Housing Resilience Talents/Skills Transportation  ADL's:  Intact  Cognition:  WNL  Sleep:   3-4 hrs      Assessment and Plan: Bipolar disorder type I.  Generalized anxiety disorder.  Primary insomnia.  Patient had missed appointment with Brandy [name] but like to reschedule again.  She also like to try a different medicine for sleep.  In the past she had tried trazodone that did not help him.  We will discontinue temazepam and try Lunesta 2 mg to take as needed for insomnia.  Continue Cymbalta 30 mg daily, Lamictal 300 mg daily and Latuda 80 mg daily.  She has no rash, itching tremors or shakes.  Recommended to call us back if she is any question or any concern.  Follow-up in 3 months.  Follow Up Instructions:  I discussed the assessment and treatment plan with the patient. The patient was provided an opportunity to ask questions and all were answered. The patient agreed with the plan and demonstrated an understanding of the instructions.   The patient was advised to call back or seek an in-person evaluation if the symptoms worsen or if the condition fails to improve as anticipated.  I provided 20 minutes of non-face-to-face time during this encounter.   Brandy Nations, MD

## 2020-03-16 ENCOUNTER — Other Ambulatory Visit: Payer: Self-pay | Admitting: Family Medicine

## 2020-03-16 ENCOUNTER — Other Ambulatory Visit (HOSPITAL_COMMUNITY): Payer: Self-pay | Admitting: Psychiatry

## 2020-03-16 DIAGNOSIS — K219 Gastro-esophageal reflux disease without esophagitis: Secondary | ICD-10-CM

## 2020-03-16 DIAGNOSIS — I10 Essential (primary) hypertension: Secondary | ICD-10-CM

## 2020-03-16 DIAGNOSIS — F411 Generalized anxiety disorder: Secondary | ICD-10-CM

## 2020-03-16 DIAGNOSIS — F319 Bipolar disorder, unspecified: Secondary | ICD-10-CM

## 2020-03-16 MED ORDER — LOSARTAN POTASSIUM 100 MG PO TABS: 100.0000 mg | ORAL_TABLET | Freq: Every day | ORAL | 0 refills | Status: DC

## 2020-03-16 NOTE — Telephone Encounter (Signed)
Patient requesting losartan (COZAAR) 100 MG tablet , informed please allow 48 to 72 hour turn around time.   [place], [place] - [address] [facility] [address] [facility]  [address] [place] [address]  Phone:  [phone] Fax:  [phone]

## 2020-03-16 NOTE — Telephone Encounter (Signed)
Requested medication (s) are due for refill today: Yes  Requested medication (s) are on the active medication list: Yes  Last refill:  02/11/19  Future visit scheduled: Yes  Notes to clinic:  Unable to refill per protocol, expired Rx     Requested Prescriptions  Pending Prescriptions Disp Refills   losartan (COZAAR) 100 MG tablet [Pharmacy Med Name: LOSARTAN POTASSIUM 100 MG T 100 Tablet] 90 tablet 1    Sig: TAKE 1 TABLET (100 MG TOTAL) BY MOUTH DAILY.      Cardiovascular:  Angiotensin Receptor Blockers Failed - 03/16/2020  9:49 AM      Failed - Cr in normal range and within 180 days    Creat  Date Value Ref Range Status  04/29/2015 0.93 0.50 - 1.10 mg/dL Final   Creatinine, Ser  Date Value Ref Range Status  03/19/2018 1.01 (H) 0.57 - 1.00 mg/dL Final          Failed - K in normal range and within 180 days    Potassium  Date Value Ref Range Status  03/19/2018 3.9 3.5 - 5.2 mmol/L Final          Failed - Last BP in normal range    BP Readings from Last 1 Encounters:  09/16/19 (!) 178/119          Failed - Valid encounter within last 6 months    Recent Outpatient Visits           8 months ago Acute non-recurrent sinusitis of other sinus   [place], Charlane Ferretti, MD   1 year ago Acute non-recurrent sinusitis of other sinus   [facility] Charlott Rakes, MD   1 year ago Health care maintenance   [place], Charlane Ferretti, MD   2 years ago Bipolar disorder, current episode mixed, moderate ([place])   [place], Charlane Ferretti, MD   3 years ago Bipolar disorder, current episode mixed, moderate ([place])   [place], [place]ob[place], MD       Future Appointments             In 1 month Charlott Rakes, MD [place] - Patient is not pregnant       Signed  Prescriptions Disp Refills   carvedilol (COREG) 25 MG tablet 180 tablet 0    Sig: TAKE 1 TABLET (25 MG TOTAL) BY MOUTH 2 (TWO) TIMES DAILY WITH A MEAL.      Cardiovascular:  Beta Blockers Failed - 03/16/2020  9:49 AM      Failed - Last BP in normal range    BP Readings from Last 1 Encounters:  09/16/19 (!) 178/119          Failed - Valid encounter within last 6 months    Recent Outpatient Visits           8 months ago Acute non-recurrent sinusitis of other sinus   [place], Charlane Ferretti, MD   1 year ago Acute non-recurrent sinusitis of other sinus   [facility] Charlott Rakes, MD   1 year ago Health care maintenance   [place], Charlane Ferretti, MD   2 years ago Bipolar disorder, current episode mixed, moderate ([place])   [place], [place], MD   3 years ago Bipolar disorder, current episode mixed, moderate ([place])   [place], Enobong, MD       Future Appointments             In 1 month Charlott Rakes, MD [place]             Passed - Last Heart Rate in normal range    Pulse Readings from Last 1 Encounters:  09/16/19 78            pantoprazole (PROTONIX) 40 MG tablet 90 tablet 1    Sig: TAKE 1 TABLET (40 MG TOTAL) BY MOUTH DAILY.      Gastroenterology: Proton Pump Inhibitors Passed - 03/16/2020  9:49 AM      Passed - Valid encounter within last 12 months    Recent Outpatient Visits           8 months ago Acute non-recurrent sinusitis of other sinus   [place], [place], MD   1 year ago Acute non-recurrent sinusitis of other sinus   [facility] Charlott Rakes, MD   1 year ago Health care maintenance   [place], Enobong, MD   2 years ago Bipolar  disorder, current episode mixed, moderate ([place])   [place], Charlane Ferretti, MD   3 years ago Bipolar disorder, current episode mixed, moderate ([place])   [place], MD       Future Appointments             In 1 month Charlott Rakes, MD [place]

## 2020-04-20 ENCOUNTER — Other Ambulatory Visit (HOSPITAL_COMMUNITY): Payer: Self-pay | Admitting: Psychiatry

## 2020-04-20 ENCOUNTER — Encounter (HOSPITAL_COMMUNITY): Payer: Self-pay | Admitting: Psychiatry

## 2020-04-20 ENCOUNTER — Telehealth (INDEPENDENT_AMBULATORY_CARE_PROVIDER_SITE_OTHER): Payer: PPO | Admitting: Psychiatry

## 2020-04-20 DIAGNOSIS — F5101 Primary insomnia: Secondary | ICD-10-CM | POA: Diagnosis not present

## 2020-04-20 DIAGNOSIS — F419 Anxiety disorder, unspecified: Secondary | ICD-10-CM | POA: Diagnosis not present

## 2020-04-20 DIAGNOSIS — F319 Bipolar disorder, unspecified: Secondary | ICD-10-CM

## 2020-04-20 MED ORDER — LURASIDONE HCL 120 MG PO TABS: 120.0000 mg | ORAL_TABLET | Freq: Every day | ORAL | 1 refills | Status: DC

## 2020-04-20 MED ORDER — TRAZODONE HCL 100 MG PO TABS: 50.0000 mg | ORAL_TABLET | Freq: Every day | ORAL | 1 refills | Status: DC

## 2020-04-20 MED ORDER — LAMOTRIGINE 150 MG PO TABS: 150.0000 mg | ORAL_TABLET | Freq: Two times a day (BID) | ORAL | 0 refills | Status: DC

## 2020-04-20 MED ORDER — DULOXETINE HCL 30 MG PO CPEP: 30.0000 mg | ORAL_CAPSULE | Freq: Every day | ORAL | 0 refills | Status: DC

## 2020-04-20 NOTE — Progress Notes (Signed)
Virtual Visit via Telephone Note  I connected with Brandy [name] on 04/20/20 at 11:40 AM EST by telephone and verified that I am speaking with the correct person using two identifiers.  Location: Patient: home Provider: home office   I discussed the limitations, risks, security and privacy concerns of performing an evaluation and management service by telephone and the availability of in person appointments. I also discussed with the patient that there may be a patient responsible charge related to this service. The patient expressed understanding and agreed to proceed.   History of Present Illness: Patient is evaluated by phone session.  She is on the phone by herself.  Patient noticed lately more irritability, anger, mood swings and overwhelmed.  She is working part-time at [employer] but lately she is put on warning because she has been missing a lot of days.  She reported her Christmas was okay.  She is still struggle with sleep.  We have recommended Lunesta but she does not feel it did work as good.  She feels some time sad, and having crying spells and hopelessness but denies any hallucination.  She denies any suicidal thoughts.  She has no rash, itching tremors or shakes.  She also complaining of health issues including chronic pain headaches.  She is scheduled to see her PCP in few weeks.  She like to try a different medication.  She has not scheduled appointment with therapist.  She noticed more sadness since the holidays.  Her appetite is okay.  She did not want to check her weight.  Past Psychiatric History:Reviewed. H/O inpatient in 08/01/1995 after mother died. Seen at [facility] from 1990-2000,  She saw Dr. Dina Rich did IOP in 2015-08-01.No h/o suicidal attempt. H/O paranoia and diagnosed with bipolar daughter and major depression. Had tried Zoloft, Wellbutrin, Rexulti, Klonopin, Abilify, Lexapro, Risperdal and Seroquel   Psychiatric Specialty Exam: Physical Exam  Review  of Systems  There were no vitals taken for this visit.There is no height or weight on file to calculate BMI.  General Appearance: NA  Eye Contact:  NA  Speech:  Slow  Volume:  Decreased  Mood:  Irritable  Affect:  NA  Thought Process:  Descriptions of Associations: Intact  Orientation:  Full (Time, Place, and Person)  Thought Content:  Paranoid Ideation and Rumination  Suicidal Thoughts:  No  Homicidal Thoughts:  No  Memory:  Immediate;   Good Recent;   Fair Remote;   Fair  Judgement:  Fair  Insight:  Shallow  Psychomotor Activity:  NA  Concentration:  Concentration: Fair and Attention Span: Fair  Recall:  [employer] of Knowledge:  Fair  Language:  Fair  Akathisia:  No  Handed:  Right  AIMS (if indicated):     Assets:  Communication Skills Desire for Improvement Housing Social Support  ADL's:  Intact  Cognition:  WNL  Sleep:   fair     Assessment and Plan: Bipolar disorder type I.  Anxiety.  Primary insomnia.  I will discontinue Lunesta as patient does not feel it is working.  We will try trazodone 50-100 mg at bedtime to help sleep.  I also recommended Trileptal 120 mg daily.  Continue Lamictal 300 mg daily and Cymbalta 30 mg daily.  I encouraged that she need to see a therapist to help her chronic symptoms.  She agreed with the plan and she will give [name] a call to schedule appointment.  Recommended to call us back if she has any question,  concern acutely worsening of the symptoms.  We also discussed about FMLA however she is not sure because she is working part-time.  We will schedule in 6 weeks.  Follow Up Instructions:    I discussed the assessment and treatment plan with the patient. The patient was provided an opportunity to ask questions and all were answered. The patient agreed with the plan and demonstrated an understanding of the instructions.   The patient was advised to call back or seek an in-person evaluation if the symptoms worsen or if the condition fails  to improve as anticipated.  I provided 17 minutes of non-face-to-face time during this encounter.   [name], MD

## 2020-05-05 ENCOUNTER — Ambulatory Visit: Payer: PPO | Attending: Family Medicine | Admitting: Family Medicine

## 2020-05-05 ENCOUNTER — Other Ambulatory Visit: Payer: Self-pay

## 2020-05-05 ENCOUNTER — Encounter: Payer: Self-pay | Admitting: Family Medicine

## 2020-05-05 ENCOUNTER — Other Ambulatory Visit: Payer: Self-pay | Admitting: Family Medicine

## 2020-05-05 VITALS — BP 152/101 | HR 82 | Ht 60.0 in | Wt 200.2 lb

## 2020-05-05 DIAGNOSIS — F3162 Bipolar disorder, current episode mixed, moderate: Secondary | ICD-10-CM | POA: Diagnosis not present

## 2020-05-05 DIAGNOSIS — N3941 Urge incontinence: Secondary | ICD-10-CM

## 2020-05-05 DIAGNOSIS — M79672 Pain in left foot: Secondary | ICD-10-CM

## 2020-05-05 DIAGNOSIS — K219 Gastro-esophageal reflux disease without esophagitis: Secondary | ICD-10-CM

## 2020-05-05 DIAGNOSIS — I10 Essential (primary) hypertension: Secondary | ICD-10-CM

## 2020-05-05 DIAGNOSIS — Z1159 Encounter for screening for other viral diseases: Secondary | ICD-10-CM

## 2020-05-05 DIAGNOSIS — M7711 Lateral epicondylitis, right elbow: Secondary | ICD-10-CM

## 2020-05-05 DIAGNOSIS — Z1231 Encounter for screening mammogram for malignant neoplasm of breast: Secondary | ICD-10-CM | POA: Diagnosis not present

## 2020-05-05 DIAGNOSIS — Z1211 Encounter for screening for malignant neoplasm of colon: Secondary | ICD-10-CM

## 2020-05-05 DIAGNOSIS — M62838 Other muscle spasm: Secondary | ICD-10-CM | POA: Diagnosis not present

## 2020-05-05 DIAGNOSIS — R600 Localized edema: Secondary | ICD-10-CM | POA: Diagnosis not present

## 2020-05-05 MED ORDER — PREDNISONE 20 MG PO TABS: 20.0000 mg | ORAL_TABLET | Freq: Every day | ORAL | 0 refills | Status: DC

## 2020-05-05 MED ORDER — CARVEDILOL 25 MG PO TABS: 25.0000 mg | ORAL_TABLET | Freq: Two times a day (BID) | ORAL | 0 refills | Status: DC

## 2020-05-05 MED ORDER — PANTOPRAZOLE SODIUM 40 MG PO TBEC: 40.0000 mg | DELAYED_RELEASE_TABLET | Freq: Every day | ORAL | 1 refills | Status: DC

## 2020-05-05 MED ORDER — POTASSIUM CHLORIDE CRYS ER 20 MEQ PO TBCR: 20.0000 meq | EXTENDED_RELEASE_TABLET | Freq: Every day | ORAL | 1 refills | Status: DC

## 2020-05-05 MED ORDER — DICLOFENAC SODIUM 1 % EX GEL: 4.0000 g | Freq: Four times a day (QID) | CUTANEOUS | 1 refills | Status: DC

## 2020-05-05 MED ORDER — FUROSEMIDE 20 MG PO TABS: 20.0000 mg | ORAL_TABLET | Freq: Every day | ORAL | 1 refills | Status: DC

## 2020-05-05 MED ORDER — LOSARTAN POTASSIUM-HCTZ 100-25 MG PO TABS: 1.0000 | ORAL_TABLET | Freq: Every day | ORAL | 1 refills | Status: DC

## 2020-05-05 MED ORDER — AMLODIPINE BESYLATE 5 MG PO TABS: 5.0000 mg | ORAL_TABLET | Freq: Every day | ORAL | 1 refills | Status: DC

## 2020-05-05 MED ORDER — CYCLOBENZAPRINE HCL 10 MG PO TABS: 10.0000 mg | ORAL_TABLET | Freq: Every evening | ORAL | 0 refills | Status: DC | PRN

## 2020-05-05 MED ORDER — OXYBUTYNIN CHLORIDE 5 MG PO TABS: 5.0000 mg | ORAL_TABLET | Freq: Two times a day (BID) | ORAL | 1 refills | Status: DC

## 2020-05-05 NOTE — Patient Instructions (Signed)
Tennis Elbow  Tennis elbow is irritation and swelling (inflammation) in your outer forearm, near your elbow. Swelling affects the tissues that connect muscle to bone (tendons). Tennis elbow can happen playing any sport or doing any job where you use your elbow too much. It is caused by doing the same motion over and over. What are the causes? This condition is often caused by playing sports or doing work where you need to keep moving your forearm the same way. Sometimes, it may be caused by a sudden injury. What increases the risk? You are more likely to get tennis elbow if you play tennis or another racket sport. You also have a higher risk if you often use your hands for work. This includes:  People who use computers.  Architect workers.  People who work in a factory.  Musicians.  Cooks.  Cashiers. What are the signs or symptoms?  Pain and tenderness in your forearm and the outer part of your elbow. You may have pain all the time or only when you use your arm.  A burning feeling. This starts in your elbow and spreads down your arm.  A weak grip in your hand. How is this treated? Resting and icing your arm is often the first treatment. Your doctor may also recommend:  Medicines to reduce pain and swelling.  An elbow strap.  Physical therapy. This may include massage or exercises or both.  An elbow brace. If these do not help your symptoms get better, your doctor may recommend surgery. Follow these instructions at home: If you have a brace or strap:  Wear the brace or strap as told by your doctor. Take it off only as told by your doctor.  Check the skin around the brace or strap every day. Tell your doctor if you see problems.  Loosen it if your fingers: ? Tingle. ? Become numb. ? Turn cold and blue.  Keep the brace or strap clean.  If the brace or strap is not waterproof: ? Do not let it get wet. ? Cover it with a watertight covering when you take a bath or a  shower. Managing pain, stiffness, and swelling  If told, put ice on the injured area. To do this: ? If you have a removable brace or strap, take it off as told by your doctor. ? Put ice in a plastic bag. ? Place a towel between your skin and the bag. ? Leave the ice on for 20 minutes, 2-3 times a day. ? Take off the ice if your skin turns bright red. This is very important. If you cannot feel pain, heat, or cold, you have a greater risk of damage to the area.  Move your fingers often.   Activity  Rest your elbow and wrist. Avoid activities that can cause elbow problems as told by your doctor.  Do exercises as told by your doctor.  If you lift an object, lift it with your palm facing up. Lifestyle  If your tennis elbow is caused by sports, check your equipment and make sure that: ? You are using it the right way. ? It fits you well.  If your tennis elbow is caused by work or computer use, take breaks often to stretch your arm. Talk with your manager about how you can make your condition better at work. General instructions  Take over-the-counter and prescription medicines only as told by your doctor.  Do not smoke or use any products that contain nicotine or tobacco.  If you need help quitting, ask your doctor.  Keep all follow-up visits. How is this prevented?  Before and after being active: ? Warm up and stretch before being active. ? Cool down and stretch after being active. ? Give your body time to rest between activities.  While being active: ? Make sure to use equipment that fits you. ? If you play tennis, put power in your stroke with your lower body. Avoid using your arm only.  Maintain physical fitness. This includes: ? Strength. ? Flexibility. ? Endurance.  Do exercises to strengthen the forearm muscles. Contact a doctor if:  Your pain does not get better with treatment.  Your pain gets worse.  You have weakness in your forearm, hand, or fingers.  You  cannot feel your forearm, hand, or fingers. Get help right away if:  Your pain is very bad.  You cannot move your wrist. Summary  Tennis elbow is irritation and swelling (inflammation) in your outer forearm, near your elbow.  Tennis elbow is caused by doing the same motion over and over.  Rest your elbow and wrist. Avoid activities as told by your doctor.  If told, put ice on the injured area for 20 minutes, 2-3 times a day. This information is not intended to replace advice given to you by your health care provider. Make sure you discuss any questions you have with your health care provider. Document Revised: 10/01/2019 Document Reviewed: 10/01/2019 Elsevier Patient Education  2021 Elsevier Inc.  

## 2020-05-05 NOTE — Progress Notes (Signed)
Subjective:  Patient ID: Brandy [name], female    DOB: 06-30-1965  Age: 55 y.o. MRN: [id number]  CC: Foot Pain   HPI Brandy [name] is a 55 year old female with a history of hypertension, bipolar disorder, vertigo, GERD, history of pulmonary edema who comes into the clinic for a follow-up visit She has left foot pain for the last couple of months with no preceding trauma and this occurs when she applies pressure on it.  Pain is in the lateral aspect of left foot. Complains of right forearm pain on the lateral aspect and has not noticed any swelling.  She is right-handed.  Her BP is elevated and she endorses compliance with her antihypertensive. Her depression is not controlled and she is followed by psychiatry-Dr. Adele Schilder.  She had a visit with him 1 week ago.  She is concerned that due to her depression and missing work she might lose her job.  Her psychiatrist had recommended completing an FMLA paperwork but she is yet to turn this in.  She complains of pain in her left thoracolumbar region for the last couple of days and denies history of trauma.  Pain is described as moderate to severe. With regards to healthcare maintenance she is due for a Pap smear, colonoscopy and mammogram. Past Medical History:  Diagnosis Date  . Anxiety   . Depression   . Diverticulitis   . Edema   . Gallstones   . Hypertension   . Osteoarthritis     Past Surgical History:  Procedure Laterality Date  . GALLBLADDER SURGERY      Family History  Problem Relation Age of Onset  . Alcohol abuse Mother   . Schizophrenia Maternal Grandmother   . Coronary artery disease Paternal Grandmother        young age  . Diabetes Paternal Grandmother   . Hypertension Father   . Diabetes Sister   . Schizophrenia Maternal Aunt     Allergies  Allergen Reactions  . Azithromycin Other (See Comments)    "Stomach cramps"  . Contrast Media [Iodinated Diagnostic Agents] Hives  . Shellfish Allergy  Swelling and Other (See Comments)    "eyes went blind"    Outpatient Medications Prior to Visit  Medication Sig Dispense Refill  . albuterol (VENTOLIN HFA) 108 (90 Base) MCG/ACT inhaler Inhale 2 puffs into the lungs every 4 (four) hours as needed for wheezing or shortness of breath. 18 g 0  . aspirin EC 81 MG EC tablet Take 1 tablet (81 mg total) by mouth daily.    . cetirizine (ZYRTEC) 10 MG tablet Take 1 tablet (10 mg total) by mouth daily. 30 tablet 1  . DULoxetine (CYMBALTA) 30 MG capsule Take 1 capsule (30 mg total) by mouth daily. 90 capsule 0  . fluticasone (FLONASE) 50 MCG/ACT nasal spray Place 1 spray into both nostrils daily. 16 g 1  . lamoTRIgine (LAMICTAL) 150 MG tablet Take 1 tablet (150 mg total) by mouth 2 (two) times daily. 180 tablet 0  . Lurasidone HCl (LATUDA) 120 MG TABS Take 1 tablet (120 mg total) by mouth daily with breakfast. 30 tablet 1  . traZODone (DESYREL) 100 MG tablet Take 0.5-1 tablets (50-100 mg total) by mouth at bedtime. 30 tablet 1  . carvedilol (COREG) 25 MG tablet TAKE 1 TABLET (25 MG TOTAL) BY MOUTH 2 (TWO) TIMES DAILY WITH A MEAL. 180 tablet 0  . furosemide (LASIX) 20 MG tablet Take 1 tablet (20 mg total) by mouth daily. 90 tablet  1  . hydrochlorothiazide (HYDRODIURIL) 25 MG tablet Take 1 tablet (25 mg total) by mouth daily. 90 tablet 1  . losartan (COZAAR) 100 MG tablet Take 1 tablet (100 mg total) by mouth daily. 90 tablet 0  . oxybutynin (DITROPAN) 5 MG tablet Take 1 tablet (5 mg total) by mouth 2 (two) times daily. 180 tablet 1  . pantoprazole (PROTONIX) 40 MG tablet TAKE 1 TABLET (40 MG TOTAL) BY MOUTH DAILY. 90 tablet 1  . potassium chloride SA (KLOR-CON) 20 MEQ tablet Take 1 tablet (20 mEq total) by mouth daily. 90 tablet 1  . eszopiclone (LUNESTA) 2 MG TABS tablet Take 1 tablet (2 mg total) by mouth at bedtime as needed for sleep. Take immediately before bedtime (Patient not taking: No sig reported) 30 tablet 2  . temazepam (RESTORIL) 15 MG capsule  Take 1 capsule (15 mg total) by mouth at bedtime. (Patient not taking: Reported on 05/05/2020) 30 capsule 2  . Potassium Chloride 40 MEQ/15ML (20%) SOLN Take 20 mEq by mouth daily. (Patient not taking: Reported on 05/05/2020) 225 mL 3   No facility-administered medications prior to visit.     ROS Review of Systems  Constitutional: Negative for activity change, appetite change and fatigue.  HENT: Negative for congestion, sinus pressure and sore throat.   Eyes: Negative for visual disturbance.  Respiratory: Negative for cough, chest tightness, shortness of breath and wheezing.   Cardiovascular: Negative for chest pain and palpitations.  Gastrointestinal: Negative for abdominal distention, abdominal pain and constipation.  Endocrine: Negative for polydipsia.  Genitourinary: Negative for dysuria and frequency.  Musculoskeletal:       See HPI  Skin: Negative for rash.  Neurological: Negative for tremors, light-headedness and numbness.  Hematological: Does not bruise/bleed easily.  Psychiatric/Behavioral: Negative for agitation and behavioral problems.       Dysphoric mood    Objective:  BP (!) 152/101   Pulse 82   Ht 5' (1.524 m)   Wt 200 lb 3.2 oz (90.8 kg)   SpO2 99%   BMI 39.10 kg/m   BP/Weight 05/05/2020 09/16/2019 85/46/2703  Systolic BP 500 938 182  Diastolic BP 993 716 92  Wt. (Lbs) 200.2 - 180.4  BMI 39.1 - 34.09  Some encounter information is confidential and restricted. Go to Review Flowsheets activity to see all data.      Physical Exam Constitutional:      Appearance: She is well-developed.  Neck:     Vascular: No JVD.  Cardiovascular:     Rate and Rhythm: Normal rate.     Heart sounds: Normal heart sounds. No murmur heard.   Pulmonary:     Effort: Pulmonary effort is normal.     Breath sounds: Normal breath sounds. No wheezing or rales.  Chest:     Chest wall: No tenderness.  Abdominal:     General: Bowel sounds are normal. There is no distension.      Palpations: Abdomen is soft. There is no mass.     Tenderness: There is no abdominal tenderness.  Musculoskeletal:        General: Swelling (lateral aspect of L foot) and tenderness (TTP of lateral aspect of L foot) present.     Right lower leg: No edema.     Left lower leg: No edema.     Comments: Tenderness to palpation of right lateral epicondyle; left is normal Tenderness to palpation of left cervical lumbar muscle with associated tightening of muscle  Neurological:     Mental  Status: She is alert and oriented to person, place, and time.  Psychiatric:     Comments: Dysphoric mood     CMP Latest Ref Rng & Units 03/19/2018 10/02/2017 10/24/2016  Glucose 65 - 99 mg/dL 101(H) 92 97  BUN 6 - 24 mg/dL 18 16 10   Creatinine 0.57 - 1.00 mg/dL 1.01(H) 1.14(H) 1.14(H)  Sodium 134 - 144 mmol/L 140 140 145(H)  Potassium 3.5 - 5.2 mmol/L 3.9 4.2 3.9  Chloride 96 - 106 mmol/L 99 101 103  CO2 20 - 29 mmol/L 24 28 26   Calcium 8.7 - 10.2 mg/dL 10.1 10.3(H) 9.0  Total Protein 6.0 - 8.5 g/dL - 7.7 7.2  Total Bilirubin 0.0 - 1.2 mg/dL - <0.2 0.2  Alkaline Phos 39 - 117 IU/L - 78 67  AST 0 - 40 IU/L - 16 13  ALT 0 - 32 IU/L - 17 13    Lipid Panel     Component Value Date/Time   CHOL 157 10/24/2016 1027   TRIG 117 10/24/2016 1027   HDL 47 10/24/2016 1027   CHOLHDL 3.3 10/24/2016 1027   CHOLHDL 3.1 02/03/2015 0420   VLDL 24 02/03/2015 0420   LDLCALC 87 10/24/2016 1027    CBC    Component Value Date/Time   WBC 5.7 10/08/2015 0815   RBC 4.27 10/08/2015 0815   HGB 12.2 10/08/2015 0815   HCT 37.6 10/08/2015 0815   PLT 220 10/08/2015 0815   MCV 88.1 10/08/2015 0815   MCH 28.6 10/08/2015 0815   MCHC 32.4 10/08/2015 0815   RDW 15.1 10/08/2015 0815   LYMPHSABS 1.8 10/08/2015 0815   MONOABS 0.2 10/08/2015 0815   EOSABS 0.2 10/08/2015 0815   BASOSABS 0.0 10/08/2015 0815    Lab Results  Component Value Date   HGBA1C 5.70 04/29/2015    Assessment & Plan:  1. Muscle spasm Advised to  apply heat, massage - cyclobenzaprine (FLEXERIL) 10 MG tablet; Take 1 tablet (10 mg total) by mouth at bedtime as needed for muscle spasms.  Dispense: 30 tablet; Refill: 0  2. Lateral epicondylitis of right elbow Advised that if symptoms persist she may need to be referred to orthopedics. - diclofenac Sodium (VOLTAREN) 1 % GEL; Apply 4 g topically 4 (four) times daily.  Dispense: 100 g; Refill: 1 - predniSONE (DELTASONE) 20 MG tablet; Take 1 tablet (20 mg total) by mouth daily with breakfast.  Dispense: 5 tablet; Refill: 0  3. Left foot pain Advised to use good support shoes - diclofenac Sodium (VOLTAREN) 1 % GEL; Apply 4 g topically 4 (four) times daily.  Dispense: 100 g; Refill: 1 - predniSONE (DELTASONE) 20 MG tablet; Take 1 tablet (20 mg total) by mouth daily with breakfast.  Dispense: 5 tablet; Refill: 0  4. Essential hypertension Uncontrolled Amlodipine added to regimen Counseled on blood pressure goal of less than 130/80, low-sodium, DASH diet, medication compliance, 150 minutes of moderate intensity exercise per week. Discussed medication compliance, adverse effects. - amLODipine (NORVASC) 5 MG tablet; Take 1 tablet (5 mg total) by mouth daily.  Dispense: 90 tablet; Refill: 1 - losartan-hydrochlorothiazide (HYZAAR) 100-25 MG tablet; Take 1 tablet by mouth daily.  Dispense: 90 tablet; Refill: 1 - carvedilol (COREG) 25 MG tablet; Take 1 tablet (25 mg total) by mouth 2 (two) times daily with a meal.  Dispense: 180 tablet; Refill: 0 - CMP14+EGFR - Lipid Panel  5. Pedal edema Stable - furosemide (LASIX) 20 MG tablet; Take 1 tablet (20 mg total) by mouth daily.  Dispense:  90 tablet; Refill: 1  6. Urge incontinence Controlled - oxybutynin (DITROPAN) 5 MG tablet; Take 1 tablet (5 mg total) by mouth 2 (two) times daily.  Dispense: 180 tablet; Refill: 1  7. Gastroesophageal reflux disease without esophagitis Controlled - pantoprazole (PROTONIX) 40 MG tablet; Take 1 tablet (40 mg  total) by mouth daily.  Dispense: 90 tablet; Refill: 1  8. Need for hepatitis C screening test - HCV RNA quant rflx ultra or genotyp([facility])  9. Encounter for screening mammogram for malignant neoplasm of breast - MM 3D SCREEN BREAST BILATERAL; Future  10. Screening for colon cancer - Ambulatory referral to Gastroenterology  11.  Bipolar disorder Uncontrolled She will benefit from therapy but needs to schedule an appointment This is significantly affecting her job Advised that she may need to turn in an FMLA paperwork for her psychiatrist to complete.  47 minutes of total face to face time spent reviewing chart, discussing diagnosis, investigations, treatment plan, counseling, coordination of care.  Meds ordered this encounter  Medications  . amLODipine (NORVASC) 5 MG tablet    Sig: Take 1 tablet (5 mg total) by mouth daily.    Dispense:  90 tablet    Refill:  1  . losartan-hydrochlorothiazide (HYZAAR) 100-25 MG tablet    Sig: Take 1 tablet by mouth daily.    Dispense:  90 tablet    Refill:  1    Discontinue losartan, discontinue hydrochlorothiazide  . cyclobenzaprine (FLEXERIL) 10 MG tablet    Sig: Take 1 tablet (10 mg total) by mouth at bedtime as needed for muscle spasms.    Dispense:  30 tablet    Refill:  0  . diclofenac Sodium (VOLTAREN) 1 % GEL    Sig: Apply 4 g topically 4 (four) times daily.    Dispense:  100 g    Refill:  1  . predniSONE (DELTASONE) 20 MG tablet    Sig: Take 1 tablet (20 mg total) by mouth daily with breakfast.    Dispense:  5 tablet    Refill:  0  . carvedilol (COREG) 25 MG tablet    Sig: Take 1 tablet (25 mg total) by mouth 2 (two) times daily with a meal.    Dispense:  180 tablet    Refill:  0  . furosemide (LASIX) 20 MG tablet    Sig: Take 1 tablet (20 mg total) by mouth daily.    Dispense:  90 tablet    Refill:  1  . oxybutynin (DITROPAN) 5 MG tablet    Sig: Take 1 tablet (5 mg total) by mouth 2 (two) times daily.     Dispense:  180 tablet    Refill:  1  . pantoprazole (PROTONIX) 40 MG tablet    Sig: Take 1 tablet (40 mg total) by mouth daily.    Dispense:  90 tablet    Refill:  1  . potassium chloride SA (KLOR-CON) 20 MEQ tablet    Sig: Take 1 tablet (20 mEq total) by mouth daily.    Dispense:  90 tablet    Refill:  1    Follow-up: Return in about 1 month (around 06/02/2020) for PAP smear.       Charlott Rakes, MD, FAAFP. [facility], [place]   05/05/2020, 4:28 PM

## 2020-05-05 NOTE — Progress Notes (Signed)
Having pain in side of left foot. Having pain in right forearm. Needs refills on medications.

## 2020-05-06 LAB — LIPID PANEL
Chol/HDL Ratio: 3.9 ratio (ref 0.0–4.4)
Cholesterol, Total: 193 mg/dL (ref 100–199)
HDL: 49 mg/dL (ref >39)
LDL Chol Calc (NIH): 107 mg/dL — ABNORMAL HIGH (ref 0–99)
Triglycerides: 215 mg/dL — ABNORMAL HIGH (ref 0–149)
VLDL Cholesterol Cal: 37 mg/dL (ref 5–40)

## 2020-05-07 LAB — CMP14+EGFR
ALT: 15 IU/L (ref 0–32)
AST: 12 IU/L (ref 0–40)
Albumin/Globulin Ratio: 1.6 (ref 1.2–2.2)
Albumin: 4.8 g/dL (ref 3.8–4.9)
Alkaline Phosphatase: 94 IU/L (ref 44–121)
BUN/Creatinine Ratio: 12 (ref 9–23)
BUN: 14 mg/dL (ref 6–24)
Bilirubin Total: 0.2 mg/dL (ref 0.0–1.2)
CO2: 25 mmol/L (ref 20–29)
Calcium: 10.1 mg/dL (ref 8.7–10.2)
Chloride: 102 mmol/L (ref 96–106)
Creatinine, Ser: 1.13 mg/dL — ABNORMAL HIGH (ref 0.57–1.00)
GFR calc Af Amer: 64 mL/min/{1.73_m2} (ref >59)
GFR calc non Af Amer: 55 mL/min/{1.73_m2} — ABNORMAL LOW (ref >59)
Globulin, Total: 3 g/dL (ref 1.5–4.5)
Glucose: 122 mg/dL — ABNORMAL HIGH (ref 65–99)
Potassium: 4.2 mmol/L (ref 3.5–5.2)
Sodium: 143 mmol/L (ref 134–144)
Total Protein: 7.8 g/dL (ref 6.0–8.5)

## 2020-05-07 LAB — HCV RNA QUANT RFLX ULTRA OR GENOTYP: HCV Quant Baseline: NOT DETECTED IU/mL

## 2020-05-26 ENCOUNTER — Other Ambulatory Visit (HOSPITAL_COMMUNITY): Payer: Self-pay | Admitting: Family Medicine

## 2020-05-26 ENCOUNTER — Telehealth: Payer: Self-pay | Admitting: Family Medicine

## 2020-05-26 DIAGNOSIS — M7711 Lateral epicondylitis, right elbow: Secondary | ICD-10-CM

## 2020-05-26 MED ORDER — MELOXICAM 7.5 MG PO TABS: 7.5000 mg | ORAL_TABLET | Freq: Every day | ORAL | 1 refills | Status: DC

## 2020-05-26 MED FILL — MELOXICAM 7.5 MG TABLET: 7.5 | 30 days supply | Qty: 30 | Fill #0

## 2020-05-26 NOTE — Telephone Encounter (Signed)
Prescription for Mobic has been sent to her pharmacy and I have referred her to orthopedics for further evaluation.

## 2020-05-26 NOTE — Telephone Encounter (Signed)
Will route to PCP for review. 

## 2020-05-26 NOTE — Telephone Encounter (Signed)
Pt was seen for right foreman pain on 05-05-2020. Pt is calling the cream is not working and she would like pain medication send [facility] phone number [phone]

## 2020-05-27 NOTE — Telephone Encounter (Signed)
Pt was called and informed of medication being sent to pharmacy and referral being placed.

## 2020-06-03 ENCOUNTER — Ambulatory Visit: Payer: PPO | Admitting: Family Medicine

## 2020-07-13 ENCOUNTER — Other Ambulatory Visit: Payer: Self-pay | Admitting: Family Medicine

## 2020-07-13 ENCOUNTER — Other Ambulatory Visit (HOSPITAL_COMMUNITY): Payer: Self-pay

## 2020-07-13 DIAGNOSIS — R600 Localized edema: Secondary | ICD-10-CM

## 2020-07-13 DIAGNOSIS — I10 Essential (primary) hypertension: Secondary | ICD-10-CM

## 2020-07-13 MED FILL — Carvedilol Tab 25 MG: ORAL | 90 days supply | Qty: 180 | Fill #0 | Status: AC

## 2020-07-13 MED FILL — Furosemide Tab 20 MG: ORAL | 90 days supply | Qty: 90 | Fill #0 | Status: AC

## 2020-07-13 MED FILL — Losartan Potassium & Hydrochlorothiazide Tab 100-25 MG: ORAL | 30 days supply | Qty: 30 | Fill #0 | Status: AC

## 2020-07-13 NOTE — Telephone Encounter (Signed)
Patient has refills at the pharmacy that are ready for pick up

## 2020-07-13 NOTE — Telephone Encounter (Signed)
Copied from [place] [id number]. Topic: Quick Communication - Rx Refill/Question >> Jul 13, 2020  8:30 AM Robina Ade, Helene Kelp D wrote: Medication: losartan-hydrochlorothiazide (HYZAAR) 100-25 MG tablet, furosemide (LASIX) 20 MG tablet  carvedilol (COREG) 25 MG tablet  Has the patient contacted their pharmacy? Yes.   (Agent: If no, request that the patient contact the pharmacy for the refill.) (Agent: If yes, when and what did the pharmacy advise?)  Preferred Pharmacy (with phone number or street name): [facility]  Agent: Please be advised that RX refills may take up to 3 business days. We ask that you follow-up with your pharmacy.

## 2020-07-13 NOTE — Addendum Note (Signed)
Addended by: Jefferson Fuel on: 07/13/2020 09:59 AM   Modules accepted: Orders

## 2020-08-10 ENCOUNTER — Other Ambulatory Visit: Payer: Self-pay | Admitting: Family Medicine

## 2020-08-10 DIAGNOSIS — Z1231 Encounter for screening mammogram for malignant neoplasm of breast: Secondary | ICD-10-CM

## 2020-08-26 ENCOUNTER — Other Ambulatory Visit (HOSPITAL_COMMUNITY): Payer: Self-pay

## 2020-08-26 MED FILL — Losartan Potassium & Hydrochlorothiazide Tab 100-25 MG: ORAL | 30 days supply | Qty: 30 | Fill #1 | Status: AC

## 2020-08-26 MED FILL — Amlodipine Besylate Tab 5 MG (Base Equivalent): ORAL | 90 days supply | Qty: 90 | Fill #0 | Status: AC

## 2020-08-27 ENCOUNTER — Other Ambulatory Visit (HOSPITAL_COMMUNITY): Payer: Self-pay

## 2020-10-02 ENCOUNTER — Other Ambulatory Visit (HOSPITAL_COMMUNITY): Payer: Self-pay

## 2020-10-02 MED FILL — Losartan Potassium & Hydrochlorothiazide Tab 100-25 MG: ORAL | 90 days supply | Qty: 90 | Fill #2 | Status: AC

## 2020-10-08 ENCOUNTER — Other Ambulatory Visit (HOSPITAL_COMMUNITY): Payer: Self-pay

## 2020-10-08 ENCOUNTER — Telehealth (INDEPENDENT_AMBULATORY_CARE_PROVIDER_SITE_OTHER): Payer: PPO | Admitting: Psychiatry

## 2020-10-08 ENCOUNTER — Encounter (HOSPITAL_COMMUNITY): Payer: Self-pay | Admitting: Psychiatry

## 2020-10-08 ENCOUNTER — Other Ambulatory Visit: Payer: Self-pay

## 2020-10-08 DIAGNOSIS — F419 Anxiety disorder, unspecified: Secondary | ICD-10-CM | POA: Diagnosis not present

## 2020-10-08 DIAGNOSIS — F319 Bipolar disorder, unspecified: Secondary | ICD-10-CM

## 2020-10-08 DIAGNOSIS — F5101 Primary insomnia: Secondary | ICD-10-CM | POA: Diagnosis not present

## 2020-10-08 MED ORDER — DULOXETINE HCL 30 MG PO CPEP
30.0000 mg | ORAL_CAPSULE | Freq: Every day | ORAL | 0 refills | Status: DC
  Filled 2020-10-08: qty 90, 90d supply, fill #0

## 2020-10-08 MED ORDER — LAMOTRIGINE 150 MG PO TABS
150.0000 mg | ORAL_TABLET | Freq: Two times a day (BID) | ORAL | 0 refills | Status: DC
  Filled 2020-10-08: qty 180, 90d supply, fill #0

## 2020-10-08 MED ORDER — LURASIDONE HCL 120 MG PO TABS
120.0000 mg | ORAL_TABLET | Freq: Every day | ORAL | 1 refills | Status: DC
  Filled 2020-10-08: qty 30, 30d supply, fill #0

## 2020-10-08 MED ORDER — TRAZODONE HCL 100 MG PO TABS
50.0000 mg | ORAL_TABLET | Freq: Every day | ORAL | 1 refills | Status: DC
  Filled 2020-10-08: qty 30, 30d supply, fill #0

## 2020-10-08 NOTE — Progress Notes (Signed)
Virtual Visit via Telephone Note  I connected with Brandy [name] on 10/08/20 at 10:40 AM EDT by telephone and verified that I am speaking with the correct person using two identifiers.  Location: Patient: home Provider: home office   I discussed the limitations, risks, security and privacy concerns of performing an evaluation and management service by telephone and the availability of in person appointments. I also discussed with the patient that there may be a patient responsible charge related to this service. The patient expressed understanding and agreed to proceed.   History of Present Illness: Patient is evaluated by phone session.  She admitted not consistent with the medication and apologized missing the last appointment.  She was last evaluated in January and she missed appointment.  She is still working part-time at [employer].  She admitted since not taking the medication and ran out she is experiencing irritability, anger, mood swing and crying spells.  She is not sleeping as good.  We have discontinued Lunesta and tried trazodone which she believes worked but then she ran out and did not keep the appointment.  She also feels that she needs to see a therapist in person if available.  Sometimes she forgets phone appointment.  She did not recall any side effects from the medication.  She denies any suicidal thoughts or homicidal thoughts.  However she admitted getting easily emotional irritable.  She has no rash or any itching.  She reported her appetite is okay and her weight is stable.  Past Psychiatric History: Reviewed. H/O inpatient in 1995-06-29 after mother died.  Seen at [facility] from [place],   She saw Dr. Gildardo Griffes and did IOP in 06-29-2015.  No h/o suicidal attempt.  H/O paranoia and diagnosed with bipolar daughter and major depression.  Had tried Zoloft, Wellbutrin, Rexulti, Klonopin, Abilify, Lexapro, Risperdal and Seroquel    Psychiatric Specialty Exam: Physical  Exam  Review of Systems  Weight 200 lb (90.7 kg).There is no height or weight on file to calculate BMI.  General Appearance: NA  Eye Contact:  NA  Speech:  Slow  Volume:  Decreased  Mood:  Depressed and Irritable  Affect:  NA  Thought Process:  Descriptions of Associations: Intact  Orientation:  Full (Time, Place, and Person)  Thought Content:  Rumination  Suicidal Thoughts:  No  Homicidal Thoughts:  No  Memory:  Immediate;   Fair Recent;   Fair Remote;   Fair  Judgement:  Fair  Insight:  Shallow  Psychomotor Activity:  NA  Concentration:  Concentration: Fair and Attention Span: Fair  Recall:  [employer] of Knowledge:  Fair  Language:  Fair  Akathisia:  No  Handed:  Right  AIMS (if indicated):     Assets:  Communication Skills Desire for Improvement Housing  ADL's:  Intact  Cognition:  WNL  Sleep:   fair      Assessment and Plan: Bipolar disorder type I.  Anxiety.  Primary insomnia.  Discussed noncompliance with medication and follow-up.  Patient experiencing symptoms as she is ran out from the medication.  We talked about seeing a therapist in person and she agreed.  I also offered next appointment in person the patient feel that medicines are working and she does need a therapy appointment.  She has no concern from the medication.  We will continue Lamictal 300 mg daily, Cymbalta 30 mg daily, Latuda 120 mg daily and trazodone 100 mg to take half to 1 tablet as needed for  insomnia.  Discussed medication side effects and benefits.  Recommended to call us back if she has any question or any concern.  Follow-up in 2 months.  Follow Up Instructions:    I discussed the assessment and treatment plan with the patient. The patient was provided an opportunity to ask questions and all were answered. The patient agreed with the plan and demonstrated an understanding of the instructions.   The patient was advised to call back or seek an in-person evaluation if the symptoms worsen  or if the condition fails to improve as anticipated.  I provided 15 minutes of non-face-to-face time during this encounter.   [name], MD

## 2020-10-12 ENCOUNTER — Telehealth (HOSPITAL_COMMUNITY): Payer: PPO | Admitting: Psychiatry

## 2020-10-16 ENCOUNTER — Other Ambulatory Visit (HOSPITAL_COMMUNITY): Payer: Self-pay

## 2020-10-23 ENCOUNTER — Telehealth: Payer: Self-pay

## 2020-10-23 NOTE — Telephone Encounter (Signed)
Done

## 2020-11-11 ENCOUNTER — Telehealth (HOSPITAL_COMMUNITY): Payer: Self-pay

## 2020-11-11 NOTE — Telephone Encounter (Signed)
She needs to see a PCP as I do not know why she is requesting out of work letter.  She may need to apply FMLA.

## 2020-11-11 NOTE — Telephone Encounter (Signed)
Patient called requesting a letter to excuse her from work yesterday 11/10/20, today 11/11/20, and tomorrow 11/12/20 to return to work Friday 11/13/20. Is it ok to do the letter? Please review and advise. Thank you

## 2020-11-12 ENCOUNTER — Encounter (HOSPITAL_COMMUNITY): Payer: Self-pay

## 2020-11-18 ENCOUNTER — Other Ambulatory Visit (HOSPITAL_COMMUNITY): Payer: Self-pay

## 2020-11-18 ENCOUNTER — Other Ambulatory Visit: Payer: Self-pay | Admitting: Family Medicine

## 2020-11-18 DIAGNOSIS — I10 Essential (primary) hypertension: Secondary | ICD-10-CM

## 2020-11-18 DIAGNOSIS — R600 Localized edema: Secondary | ICD-10-CM

## 2020-11-18 NOTE — Telephone Encounter (Signed)
Requested medication (s) are due for refill today:no  Requested medication (s) are on the active medication list: yes   Last refill:  07/13/2020  Future visit scheduled: no  Notes to clinic:  overdue for labs and follow up Attempted to contact patient no answer and vm is full    Requested Prescriptions  Pending Prescriptions Disp Refills   furosemide (LASIX) 20 MG tablet 90 tablet 1    Sig: TAKE 1 TABLET (20 MG TOTAL) BY MOUTH DAILY.     Cardiovascular:  Diuretics - Loop Failed - 11/18/2020 10:18 AM      Failed - Cr in normal range and within 360 days    Creat  Date Value Ref Range Status  04/29/2015 0.93 0.50 - 1.10 mg/dL Final   Creatinine, Ser  Date Value Ref Range Status  05/05/2020 1.13 (H) 0.57 - 1.00 mg/dL Final          Failed - Last BP in normal range    BP Readings from Last 1 Encounters:  05/05/20 (!) 152/101          Failed - Valid encounter within last 6 months    Recent Outpatient Visits           6 months ago Muscle spasm   [place], Charlane Ferretti, MD   1 year ago Acute non-recurrent sinusitis of other sinus   [place], Charlane Ferretti, MD   1 year ago Acute non-recurrent sinusitis of other sinus   [place], Charlane Ferretti, MD   2 years ago Health care [place], Charlane Ferretti, MD   3 years ago Bipolar disorder, current episode mixed, moderate ([place])   [place], [place], MD              Passed - K in normal range and within 360 days    Potassium  Date Value Ref Range Status  05/05/2020 4.2 3.5 - 5.2 mmol/L Final          Passed - Ca in normal range and within 360 days    Calcium  Date Value Ref Range Status  05/05/2020 10.1 8.7 - 10.2 mg/dL Final          Passed - Na in normal range and within 360 days    Sodium  Date Value Ref Range Status   05/05/2020 143 134 - 144 mmol/L Final           carvedilol (COREG) 25 MG tablet 180 tablet 0    Sig: Take 1 tablet (25 mg total) by mouth 2 (two) times daily with a meal.     Cardiovascular:  Beta Blockers Failed - 11/18/2020 10:18 AM      Failed - Last BP in normal range    BP Readings from Last 1 Encounters:  05/05/20 (!) 152/101          Failed - Valid encounter within last 6 months    Recent Outpatient Visits           6 months ago Muscle spasm   [place], [place], MD   1 year ago Acute non-recurrent sinusitis of other sinus   [place], [place], MD   1 year ago Acute non-recurrent sinusitis of other sinus   [facility] Charlott Rakes, MD  2 years ago Health care [place], [place], MD   3 years ago Bipolar disorder, current episode mixed, moderate ([place])   [place], [place], MD              Passed - Last Heart Rate in normal range    Pulse Readings from Last 1 Encounters:  05/05/20 82           amLODipine (NORVASC) 5 MG tablet 90 tablet 1    Sig: TAKE 1 TABLET (5 MG TOTAL) BY MOUTH DAILY.     Cardiovascular:  Calcium Channel Blockers Failed - 11/18/2020 10:18 AM      Failed - Last BP in normal range    BP Readings from Last 1 Encounters:  05/05/20 (!) 152/101          Failed - Valid encounter within last 6 months    Recent Outpatient Visits           6 months ago Muscle spasm   [place], Charlane Ferretti, MD   1 year ago Acute non-recurrent sinusitis of other sinus   [place], Enobong, MD   1 year ago Acute non-recurrent sinusitis of other sinus   [facility] Charlott Rakes, MD   2 years ago Health care [place], Charlane Ferretti, MD   3 years ago Bipolar disorder, current episode mixed, moderate [facility])   [place] [facility] Charlott Rakes, MD

## 2020-11-19 ENCOUNTER — Other Ambulatory Visit: Payer: Self-pay | Admitting: Family Medicine

## 2020-11-19 ENCOUNTER — Other Ambulatory Visit (HOSPITAL_COMMUNITY): Payer: Self-pay

## 2020-11-19 DIAGNOSIS — R600 Localized edema: Secondary | ICD-10-CM

## 2020-11-23 ENCOUNTER — Other Ambulatory Visit (HOSPITAL_COMMUNITY): Payer: Self-pay

## 2020-11-23 ENCOUNTER — Telehealth: Payer: Self-pay | Admitting: *Deleted

## 2020-11-23 DIAGNOSIS — I10 Essential (primary) hypertension: Secondary | ICD-10-CM

## 2020-11-23 DIAGNOSIS — R600 Localized edema: Secondary | ICD-10-CM

## 2020-11-23 DIAGNOSIS — N3941 Urge incontinence: Secondary | ICD-10-CM

## 2020-11-23 DIAGNOSIS — K219 Gastro-esophageal reflux disease without esophagitis: Secondary | ICD-10-CM

## 2020-11-23 MED ORDER — FUROSEMIDE 20 MG PO TABS
20.0000 mg | ORAL_TABLET | Freq: Every day | ORAL | 0 refills | Status: DC
  Filled 2020-11-23 – 2020-12-10 (×2): qty 90, 90d supply, fill #0

## 2020-11-23 MED ORDER — POTASSIUM CHLORIDE CRYS ER 20 MEQ PO TBCR
20.0000 meq | EXTENDED_RELEASE_TABLET | Freq: Every day | ORAL | 0 refills | Status: DC
  Filled 2020-11-23 – 2020-12-10 (×2): qty 90, 90d supply, fill #0

## 2020-11-23 MED ORDER — OXYBUTYNIN CHLORIDE 5 MG PO TABS
5.0000 mg | ORAL_TABLET | Freq: Two times a day (BID) | ORAL | 0 refills | Status: DC
  Filled 2020-11-23: qty 180, 90d supply, fill #0

## 2020-11-23 MED ORDER — CARVEDILOL 25 MG PO TABS
25.0000 mg | ORAL_TABLET | Freq: Two times a day (BID) | ORAL | 0 refills | Status: DC
  Filled 2020-11-23 – 2020-12-10 (×2): qty 180, 90d supply, fill #0

## 2020-11-23 MED ORDER — AMLODIPINE BESYLATE 5 MG PO TABS
5.0000 mg | ORAL_TABLET | Freq: Every day | ORAL | 0 refills | Status: DC
  Filled 2020-11-23 – 2020-12-10 (×2): qty 90, 90d supply, fill #0

## 2020-11-23 MED ORDER — LOSARTAN POTASSIUM-HCTZ 100-25 MG PO TABS
1.0000 | ORAL_TABLET | Freq: Every day | ORAL | 0 refills | Status: DC
  Filled 2020-11-23 – 2020-12-10 (×2): qty 90, 90d supply, fill #0

## 2020-11-23 MED ORDER — PANTOPRAZOLE SODIUM 40 MG PO TBEC
40.0000 mg | DELAYED_RELEASE_TABLET | Freq: Every day | ORAL | 0 refills | Status: DC
  Filled 2020-11-23: qty 90, 90d supply, fill #0

## 2020-11-23 NOTE — Telephone Encounter (Signed)
Copied from [place] [phone]. Topic: General - Other >> Nov 18, 2020  4:24 PM Alanda Slim E wrote: Reason for CRM: Pts husband has dialysis and she needs an appt for a med follow up on a Monday, Wednesday or Friday to work around husbands appts/ pt is out of medications and asked for an appt sooner than November / please advise

## 2020-11-23 NOTE — Telephone Encounter (Signed)
I have refilled her medications.  She can be placed on the wait list for any cancellations or no-shows.

## 2020-11-23 NOTE — Telephone Encounter (Signed)
Copied from [place] [id number]. Topic: General - Other >> Nov 18, 2020  4:24 PM Alanda Slim E wrote: Reason for CRM: Pts husband has dialysis and she needs an appt for a med follow up on a Monday, Wednesday or Friday to work around husbands appts/ pt is out of medications and asked for an appt sooner than November / please advise

## 2020-11-23 NOTE — Telephone Encounter (Signed)
Please place patient on wait list.

## 2020-12-01 ENCOUNTER — Other Ambulatory Visit (HOSPITAL_COMMUNITY): Payer: Self-pay

## 2020-12-10 ENCOUNTER — Other Ambulatory Visit (HOSPITAL_COMMUNITY): Payer: Self-pay

## 2020-12-16 ENCOUNTER — Other Ambulatory Visit (HOSPITAL_COMMUNITY): Payer: Self-pay

## 2020-12-16 ENCOUNTER — Other Ambulatory Visit: Payer: Self-pay

## 2020-12-16 ENCOUNTER — Telehealth (INDEPENDENT_AMBULATORY_CARE_PROVIDER_SITE_OTHER): Payer: PPO | Admitting: Psychiatry

## 2020-12-16 ENCOUNTER — Encounter (HOSPITAL_COMMUNITY): Payer: Self-pay | Admitting: Psychiatry

## 2020-12-16 DIAGNOSIS — F319 Bipolar disorder, unspecified: Secondary | ICD-10-CM | POA: Diagnosis not present

## 2020-12-16 DIAGNOSIS — F419 Anxiety disorder, unspecified: Secondary | ICD-10-CM | POA: Diagnosis not present

## 2020-12-16 DIAGNOSIS — F5101 Primary insomnia: Secondary | ICD-10-CM | POA: Diagnosis not present

## 2020-12-16 MED ORDER — LURASIDONE HCL 120 MG PO TABS
120.0000 mg | ORAL_TABLET | Freq: Every day | ORAL | 2 refills | Status: DC
  Filled 2020-12-16 – 2021-01-06 (×2): qty 30, 30d supply, fill #0

## 2020-12-16 MED ORDER — LAMOTRIGINE 150 MG PO TABS
150.0000 mg | ORAL_TABLET | Freq: Two times a day (BID) | ORAL | 0 refills | Status: DC
  Filled 2020-12-16 – 2021-01-06 (×2): qty 180, 90d supply, fill #0

## 2020-12-16 MED ORDER — DULOXETINE HCL 60 MG PO CPEP
60.0000 mg | ORAL_CAPSULE | Freq: Every day | ORAL | 0 refills | Status: DC
  Filled 2020-12-16 – 2021-01-06 (×2): qty 90, 90d supply, fill #0

## 2020-12-16 MED ORDER — TRAZODONE HCL 100 MG PO TABS
100.0000 mg | ORAL_TABLET | Freq: Every day | ORAL | 0 refills | Status: DC
  Filled 2020-12-16: qty 90, 90d supply, fill #0

## 2020-12-16 NOTE — Progress Notes (Signed)
Virtual Visit via Telephone Note  I connected with Brandy [name] on 12/16/20 at  4:20 PM EDT by telephone and verified that I am speaking with the correct person using two identifiers.  Location: Patient: Home Provider: Home Office   I discussed the limitations, risks, security and privacy concerns of performing an evaluation and management service by telephone and the availability of in person appointments. I also discussed with the patient that there may be a patient responsible charge related to this service. The patient expressed understanding and agreed to proceed.   History of Present Illness: Patient is evaluated by phone session.  She admitted very dysphoric and emotional because she could not work and since August 22 she had to quit her job.  Patient told she could not understand the computer at work and decided to quit.  She lives with her husband and daughter.  She is stressed and admitted sometimes not taking the medication as prescribed.  She also forgot to schedule appointment with a therapist.  She is sleeping on and off.  She keeps apologizing about not consistent with medication and follow-ups.  However she do mention when take the medicine it does help.  She started taking trazodone full tablet that helps her sleep.  Patient denies any mania, psychosis but admitted irritability, frustration, anger and mood swings.  She endorsed anxiety but denies any panic attack.  She reported her appetite is okay but she does not have a scale and not able to provide information about her weight.  She denies any tremors shakes or any EPS.  Past Psychiatric History: Reviewed. H/O inpatient in 13-Jul-1995 after mother died.  Seen at [facility] from [place],   She saw Dr. Gildardo Griffes and did IOP in 07/13/15.  No h/o suicidal attempt.  H/O paranoia, depression and paranoia. Had tried Zoloft, Wellbutrin, Rexulti, Klonopin, Abilify, Lexapro, Risperdal and Seroquel  Psychiatric Specialty Exam: Physical  Exam  Review of Systems  There were no vitals taken for this visit.There is no height or weight on file to calculate BMI.  General Appearance: NA  Eye Contact:  NA  Speech:  Slow  Volume:  Decreased  Mood:  Depressed, Dysphoric, and emotional  Affect:  NA  Thought Process:  Descriptions of Associations: Circumstantial  Orientation:  Full (Time, Place, and Person)  Thought Content:  Paranoid Ideation and Rumination  Suicidal Thoughts:  No  Homicidal Thoughts:  No  Memory:  Immediate;   Fair Recent;   Fair Remote;   Fair  Judgement:  Fair  Insight:  Shallow  Psychomotor Activity:  NA  Concentration:  Concentration: Fair and Attention Span: Fair  Recall:  [employer] of Knowledge:  Fair  Language:  Fair  Akathisia:  No  Handed:  Right  AIMS (if indicated):     Assets:  Communication Skills Desire for Improvement Housing Social Support  ADL's:  Intact  Cognition:  WNL  Sleep:   poor      Assessment and Plan: Bipolar disorder type I.  Anxiety.  Primary insomnia.  Again discussed noncompliance with medication and she did not call to schedule appointment for therapy.  Patient did not like to increase trazodone as she able to sleep better.  She does not want to try a different medication and like to give more time and agree that she will be consistent.  She also agreed that she will call us to discuss appointment with a therapist in our office.  We talk about trying increasing Cymbalta 60  mg to help her mood and anxiety and she agreed.  We will continue Lamictal 300 mg daily, Latuda 120 mg daily, trazodone 100 mg at bedtime and increase Cymbalta 60 mg daily.  Discussed medication side effects and benefits.  Recommended to call us back if she has any question or any concern.  Follow-up in 3 months.  Follow Up Instructions:    I discussed the assessment and treatment plan with the patient. The patient was provided an opportunity to ask questions and all were answered. The patient  agreed with the plan and demonstrated an understanding of the instructions.   The patient was advised to call back or seek an in-person evaluation if the symptoms worsen or if the condition fails to improve as anticipated.  I provided 19 minutes of non-face-to-face time during this encounter.   [name], MD

## 2020-12-21 ENCOUNTER — Telehealth (HOSPITAL_COMMUNITY): Payer: Self-pay | Admitting: Licensed Clinical Social Worker

## 2020-12-21 ENCOUNTER — Ambulatory Visit (HOSPITAL_COMMUNITY): Payer: PPO | Admitting: Licensed Clinical Social Worker

## 2020-12-21 ENCOUNTER — Other Ambulatory Visit: Payer: Self-pay

## 2020-12-23 ENCOUNTER — Other Ambulatory Visit: Payer: Self-pay

## 2020-12-23 ENCOUNTER — Ambulatory Visit (HOSPITAL_COMMUNITY): Payer: PPO | Admitting: Licensed Clinical Social Worker

## 2020-12-23 DIAGNOSIS — F332 Major depressive disorder, recurrent severe without psychotic features: Secondary | ICD-10-CM

## 2020-12-23 DIAGNOSIS — F411 Generalized anxiety disorder: Secondary | ICD-10-CM

## 2020-12-24 ENCOUNTER — Other Ambulatory Visit (HOSPITAL_COMMUNITY): Payer: Self-pay

## 2021-01-06 ENCOUNTER — Ambulatory Visit (INDEPENDENT_AMBULATORY_CARE_PROVIDER_SITE_OTHER): Payer: PPO | Admitting: Licensed Clinical Social Worker

## 2021-01-06 ENCOUNTER — Other Ambulatory Visit: Payer: Self-pay

## 2021-01-06 ENCOUNTER — Other Ambulatory Visit (HOSPITAL_COMMUNITY): Payer: Self-pay

## 2021-01-06 DIAGNOSIS — F332 Major depressive disorder, recurrent severe without psychotic features: Secondary | ICD-10-CM

## 2021-01-06 DIAGNOSIS — F411 Generalized anxiety disorder: Secondary | ICD-10-CM

## 2021-01-06 NOTE — Progress Notes (Signed)
Virtual Visit via Telephone Note  I connected with Brandy [name] on 01/06/21 at 11:00 AM EDT by telephone and verified that I am speaking with the correct person using two identifiers.  Location: Patient: home  Provider: office   I discussed the limitations, risks, security and privacy concerns of performing an evaluation and management service by telephone and the availability of in person appointments. I also discussed with the patient that there may be a patient responsible charge related to this service. The patient expressed understanding and agreed to proceed. I discussed the assessment and treatment plan with the patient. The patient was provided an opportunity to ask questions and all were answered. The patient agreed with the plan and demonstrated an understanding of the instructions.   The patient was advised to call back or seek an in-person evaluation if the symptoms worsen or if the condition fails to improve as anticipated.  I provided 45 minutes of non-face-to-face time during this encounter.   Olegario Messier, LCSW    THERAPIST PROGRESS NOTE  Session Time: [phone]  Participation Level: Active  Behavioral Response: NAAlertAnxious and Depressed  Type of Therapy: Individual Therapy  Treatment Goals addressed: Coping  Interventions: CBT and Supportive  Summary: Brandy [name] is a 55 y.o. female who presents with major depressive disorder. Client made some progress toward goal AEB taking medications 7/7 days. Client needs continued support on goal of decreasing isolation and improved mood by half at least 3 days weekly.  Suicidal/Homicidal: No  Therapist Response: Clinician checked in with client, assessed for SI/HI/psychosis and overall level of functioning. Clinician processed with client financial boundaries with husband. Clinician and client processed client feelings on fairness and supportive behaviors toward others which she does not feel  are being reciprocated. Clinician and client set daily goal of streching and changing pj's several times weekly. Clinician and client set 2 week goal of visiting local store with daughter for activity previously enjoyed. Clinician encouraged client to consider it a nessessary activity rather than voluntary to make self go even if not wanted to improve depression by decreasing isolation.  Plan: Return again in 1-2 weeks.  Diagnosis: Axis I: Major Depression, Recurrent severe    Olegario Messier, LCSW 01/06/2021

## 2021-01-20 ENCOUNTER — Other Ambulatory Visit: Payer: Self-pay

## 2021-01-20 ENCOUNTER — Ambulatory Visit (HOSPITAL_COMMUNITY): Payer: PPO | Admitting: Licensed Clinical Social Worker

## 2021-03-02 ENCOUNTER — Encounter: Payer: Self-pay | Admitting: Internal Medicine

## 2021-03-10 ENCOUNTER — Telehealth: Payer: Self-pay | Admitting: Family Medicine

## 2021-03-10 DIAGNOSIS — Z01419 Encounter for gynecological examination (general) (routine) without abnormal findings: Secondary | ICD-10-CM

## 2021-03-10 DIAGNOSIS — R600 Localized edema: Secondary | ICD-10-CM

## 2021-03-10 DIAGNOSIS — K219 Gastro-esophageal reflux disease without esophagitis: Secondary | ICD-10-CM

## 2021-03-10 DIAGNOSIS — I10 Essential (primary) hypertension: Secondary | ICD-10-CM

## 2021-03-10 NOTE — Telephone Encounter (Signed)
Pt called in to follow up on 2 referrals. Pt says that she was suppose to have a referral to have her mammogram and pap completed. Pt says that she would like to have a GYN referral as well.   Also, assisted pt with scheduling her medication follow up apt. Pt says that she will be out of her medication before her apt, pt is hoping to no be without. pt would like to be advised/assisted further.

## 2021-03-11 ENCOUNTER — Other Ambulatory Visit (HOSPITAL_COMMUNITY): Payer: Self-pay

## 2021-03-11 MED ORDER — FUROSEMIDE 20 MG PO TABS
20.0000 mg | ORAL_TABLET | Freq: Every day | ORAL | 0 refills | Status: DC
  Filled 2021-03-11: qty 90, 90d supply, fill #0

## 2021-03-11 MED ORDER — CARVEDILOL 25 MG PO TABS
25.0000 mg | ORAL_TABLET | Freq: Two times a day (BID) | ORAL | 0 refills | Status: DC
  Filled 2021-03-11: qty 180, 90d supply, fill #0

## 2021-03-11 MED ORDER — POTASSIUM CHLORIDE CRYS ER 20 MEQ PO TBCR
20.0000 meq | EXTENDED_RELEASE_TABLET | Freq: Every day | ORAL | 0 refills | Status: DC
  Filled 2021-03-11: qty 90, 90d supply, fill #0

## 2021-03-11 MED ORDER — PANTOPRAZOLE SODIUM 40 MG PO TBEC
40.0000 mg | DELAYED_RELEASE_TABLET | Freq: Every day | ORAL | 0 refills | Status: DC
  Filled 2021-03-11: qty 90, 90d supply, fill #0

## 2021-03-11 MED ORDER — LOSARTAN POTASSIUM-HCTZ 100-25 MG PO TABS
1.0000 | ORAL_TABLET | Freq: Every day | ORAL | 0 refills | Status: DC
  Filled 2021-03-11: qty 90, 90d supply, fill #0

## 2021-03-11 MED ORDER — AMLODIPINE BESYLATE 5 MG PO TABS
5.0000 mg | ORAL_TABLET | Freq: Every day | ORAL | 0 refills | Status: DC
  Filled 2021-03-11: qty 90, 90d supply, fill #0

## 2021-03-11 NOTE — Telephone Encounter (Signed)
I placed an order for mammogram for her in 08/2020.  Please provide her with the number to the breast center to call and schedule.  I have placed GYN referral for Pap as requested.  Refill sent to pharmacy.

## 2021-03-11 NOTE — Telephone Encounter (Signed)
Called pt left VM , unable to reach pt. Call back nr provided.

## 2021-03-12 ENCOUNTER — Telehealth (HOSPITAL_BASED_OUTPATIENT_CLINIC_OR_DEPARTMENT_OTHER): Payer: PPO | Admitting: Psychiatry

## 2021-03-12 ENCOUNTER — Encounter (HOSPITAL_COMMUNITY): Payer: Self-pay | Admitting: Psychiatry

## 2021-03-12 ENCOUNTER — Other Ambulatory Visit (HOSPITAL_COMMUNITY): Payer: Self-pay

## 2021-03-12 ENCOUNTER — Other Ambulatory Visit: Payer: Self-pay

## 2021-03-12 DIAGNOSIS — F319 Bipolar disorder, unspecified: Secondary | ICD-10-CM | POA: Diagnosis not present

## 2021-03-12 DIAGNOSIS — F419 Anxiety disorder, unspecified: Secondary | ICD-10-CM | POA: Diagnosis not present

## 2021-03-12 DIAGNOSIS — F5101 Primary insomnia: Secondary | ICD-10-CM

## 2021-03-12 MED ORDER — GABAPENTIN 100 MG PO CAPS
100.0000 mg | ORAL_CAPSULE | Freq: Two times a day (BID) | ORAL | 2 refills | Status: DC
  Filled 2021-03-12: qty 60, 30d supply, fill #0

## 2021-03-12 MED ORDER — DULOXETINE HCL 60 MG PO CPEP
60.0000 mg | ORAL_CAPSULE | Freq: Every day | ORAL | 0 refills | Status: DC
  Filled 2021-03-12: qty 90, 90d supply, fill #0

## 2021-03-12 MED ORDER — LAMOTRIGINE 150 MG PO TABS
150.0000 mg | ORAL_TABLET | Freq: Two times a day (BID) | ORAL | 0 refills | Status: DC
  Filled 2021-03-12: qty 180, 90d supply, fill #0

## 2021-03-12 MED ORDER — TRAZODONE HCL 150 MG PO TABS
150.0000 mg | ORAL_TABLET | Freq: Every day | ORAL | 0 refills | Status: DC
  Filled 2021-03-12: qty 90, 90d supply, fill #0

## 2021-03-12 MED ORDER — LURASIDONE HCL 120 MG PO TABS
120.0000 mg | ORAL_TABLET | Freq: Every day | ORAL | 2 refills | Status: DC
  Filled 2021-03-12: qty 30, 30d supply, fill #0

## 2021-03-12 NOTE — Progress Notes (Signed)
Virtual Visit via Telephone Note  I connected with Brandy [name] on 03/12/21 at 10:40 AM EST by telephone and verified that I am speaking with the correct person using two identifiers.  Location: Patient: In Car Provider: Home Office   I discussed the limitations, risks, security and privacy concerns of performing an evaluation and management service by telephone and the availability of in person appointments. I also discussed with the patient that there may be a patient responsible charge related to this service. The patient expressed understanding and agreed to proceed.   History of Present Illness: Patient is evaluated by phone session.  She is still sad, dysphoric, emotional and continue to have outburst.  The patient told she has a lot of things going on in her life.  She has to pay back [employer] because she was over paid.  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  She is not happy about it.  She also have health issues.  She is not able to schedule her colonoscopy appointment despite trying to call the office.  Now she feels able to get the appointment with the help of staff at the office.  She denies any hallucination, suicidal thoughts but admitted irritability, frustration.  We had increased Cymbalta on the last visit and she noticed marginal improvement but again not consistent taking her medication.  She takes the trazodone which helped some sleep but is still she wakes up in the night.  She is not consistent with therapy.  She admitted missing the call from therapist.  She has no rash or any itching.  She had a good Thanksgiving.  She is hoping to have a good Christmas.  She lives with her husband.  She is going to see her son who recently moved from [place] to [place].  She has 2 sons and a daughter and 10 grandkids.  Patient like around  her family members.  She has no tremors, shakes or any EPS.  She has not checked her weight recently.  But endorsed her appetite is okay and do not believe any changes in her weight.  She has upcoming appointment with the PCP in February.  She is taking multiple medication but is still feel she needs something during the day to help her anxiety.  She admitted on Thanksgiving had a drink but she does not drink on a regular basis.  Past Psychiatric History: Reviewed. H/O inpatient in Jul 16, 1995 after mother died.  Seen at [facility] from [place],   She saw Dr. Gildardo Griffes and did IOP in 07/16/2015.  No h/o suicidal attempt.  H/O paranoia, depression and paranoia. Had tried Zoloft, Wellbutrin, Rexulti, Klonopin, Abilify, Lexapro, Risperdal and Seroquel  Psychiatric Specialty Exam: Physical Exam  Review of Systems  There were no vitals taken for this visit.There is no height or weight on file to calculate BMI.  General Appearance: NA  Eye Contact:  NA  Speech:  Normal Rate  Volume:  Decreased  Mood:  Anxious, Depressed, Dysphoric, and Irritable  Affect:  NA  Thought Process:  Descriptions of Associations: Intact  Orientation:  Full (Time, Place, and Person)  Thought Content:  Rumination  Suicidal Thoughts:  No  Homicidal Thoughts:  No  Memory:  Immediate;   Fair Recent;   Fair Remote;   Fair  Judgement:  Fair  Insight:  Shallow  Psychomotor Activity:  NA  Concentration:  Concentration: Fair and Attention Span: Fair  Recall:  [employer] of Knowledge:  Fair  Language:  Fair  Akathisia:  No  Handed:  Right  AIMS (if indicated):     Assets:  Communication Skills Desire for Improvement Housing Social Support  ADL's:  Intact  Cognition:  WNL  Sleep:   fair      Assessment and Plan: Bipolar disorder type I.  Anxiety.  Primary insomnia.  Patient has history of noncompliance with medication and follow-up with therapy appointments.  I encouraged she should consider seeing in person visit with a  therapist and she agree.  Despite taking multiple medication she still like to try something to help her anxiety during the day.  She used to take benzodiazepines in the past but it was discontinued by her PCP.  I recommend she can try gabapentin 100 mg twice a day to help her anxiety and also help her sleep.  We will also increase trazodone 150 mg at bedtime.  Like to keep the Latuda 120 mg daily, Lamictal 300 mg daily and Cymbalta 60 mg daily.  Discussed side effects of the polypharmacy and we will consider cutting down the medication in the future when her anxiety and irritability get better after the Christmas.  Encouraged to keep appointment with Ramond Craver and PCP.  Recommended to call us back if she is any question or any concern.  Follow-up in 3 months.  Follow Up Instructions:    I discussed the assessment and treatment plan with the patient. The patient was provided an opportunity to ask questions and all were answered. The patient agreed with the plan and demonstrated an understanding of the instructions.   The patient was advised to call back or seek an in-person evaluation if the symptoms worsen or if the condition fails to improve as anticipated.  I provided 27 minutes of non-face-to-face time during this encounter.   [name], MD

## 2021-03-17 ENCOUNTER — Telehealth (HOSPITAL_COMMUNITY): Payer: PPO | Admitting: Psychiatry

## 2021-03-17 ENCOUNTER — Other Ambulatory Visit (HOSPITAL_COMMUNITY): Payer: Self-pay

## 2021-04-13 ENCOUNTER — Ambulatory Visit: Payer: PPO | Attending: Family Medicine

## 2021-04-13 DIAGNOSIS — Z0000 Encounter for general adult medical examination without abnormal findings: Secondary | ICD-10-CM

## 2021-04-13 DIAGNOSIS — Z23 Encounter for immunization: Secondary | ICD-10-CM

## 2021-04-13 DIAGNOSIS — Z114 Encounter for screening for human immunodeficiency virus [HIV]: Secondary | ICD-10-CM

## 2021-04-13 DIAGNOSIS — Z1211 Encounter for screening for malignant neoplasm of colon: Secondary | ICD-10-CM

## 2021-04-13 DIAGNOSIS — Z1159 Encounter for screening for other viral diseases: Secondary | ICD-10-CM

## 2021-04-13 NOTE — Progress Notes (Addendum)
Subjective:   Brandy [name] is a 56 y.o. female who presents for an Initial Medicare Annual Wellness Visit.  Patient visit virtually in the context of Covid-19 pandemic.  I connected with Brandy [name] on 04/13/21 at 3:00pm by telephone and verified that I am speaking with the correct person using two identifiers. I discussed the limitations, risks, security and privacy concerns of performing an evaluation and management service by telephone and the availability of in person appointments. I also discussed with the patient that there may be a patient responsible charge related to this service. The patient expressed understanding and agreed to proceed. Patient location:  Home My Location:  In office  Persons on the telephone call:  Patient  Review of Systems          Objective:    There were no vitals filed for this visit. There is no height or weight on file to calculate BMI.  Advanced Directives 04/13/2021 10/24/2016 02/24/2016 11/16/2015 04/29/2015 03/13/2015 02/20/2015  Does Patient Have a Medical Advance Directive? No No No No No No No  Would patient like information on creating a medical advance directive? Yes (Inpatient - patient defers creating a medical advance directive at this time - Information given) - - No - patient declined information No - patient declined information - -    Current Medications (verified) Outpatient Encounter Medications as of 04/13/2021  Medication Sig   albuterol (VENTOLIN HFA) 108 (90 Base) MCG/ACT inhaler Inhale 2 puffs into the lungs every 4 (four) hours as needed for wheezing or shortness of breath.   amLODipine (NORVASC) 5 MG tablet Take 1 tablet (5 mg total) by mouth daily.   aspirin EC 81 MG EC tablet Take 1 tablet (81 mg total) by mouth daily.   carvedilol (COREG) 25 MG tablet Take 1 tablet (25 mg total) by mouth 2 (two) times daily with a meal.   cetirizine (ZYRTEC) 10 MG tablet Take 1 tablet (10 mg total) by mouth daily.    cyclobenzaprine (FLEXERIL) 10 MG tablet TAKE 1 TABLET (10 MG TOTAL) BY MOUTH AT BEDTIME AS NEEDED FOR MUSCLE SPASMS.   diclofenac Sodium (VOLTAREN) 1 % GEL APPLY 4 G TOPICALLY 4 (FOUR) TIMES DAILY.   DULoxetine (CYMBALTA) 60 MG capsule Take 1 capsule (60 mg total) by mouth daily.   eszopiclone (LUNESTA) 2 MG TABS tablet TAKE 1 TABLET BY MOUTH IMMEDIATELY BEFORE BEDTIME AS NEEDED FOR SLEEP. (Patient not taking: No sig reported)   fluticasone (FLONASE) 50 MCG/ACT nasal spray Place 1 spray into both nostrils daily.   furosemide (LASIX) 20 MG tablet Take 1 tablet (20 mg total) by mouth daily.   gabapentin (NEURONTIN) 100 MG capsule Take 1 capsule (100 mg total) by mouth 2 (two) times daily.   lamoTRIgine (LAMICTAL) 150 MG tablet Take 1 tablet (150 mg total) by mouth 2 (two) times daily.   losartan-hydrochlorothiazide (HYZAAR) 100-25 MG tablet Take 1 tablet by mouth daily.   Lurasidone HCl 120 MG TABS Take 1 tablet (120 mg total) by mouth daily with breakfast.   meloxicam (MOBIC) 7.5 MG tablet Take 1 tablet (7.5 mg total) by mouth daily.   meloxicam (MOBIC) 7.5 MG tablet TAKE 1 TABLET (7.5 MG TOTAL) BY MOUTH DAILY.   oxybutynin (DITROPAN) 5 MG tablet Take 1 tablet (5 mg total) by mouth 2 (two) times daily.   pantoprazole (PROTONIX) 40 MG tablet Take 1 tablet (40 mg total) by mouth daily.   potassium chloride SA (KLOR-CON M) 20 MEQ tablet Take 1  tablet (20 mEq total) by mouth daily.   temazepam (RESTORIL) 15 MG capsule Take 1 capsule (15 mg total) by mouth at bedtime. (Patient not taking: Reported on 05/05/2020)   traZODone (DESYREL) 150 MG tablet Take 1 tablet (150 mg total) by mouth at bedtime.   [DISCONTINUED] losartan (COZAAR) 100 MG tablet Take 1 tablet (100 mg total) by mouth daily.   No facility-administered encounter medications on file as of 04/13/2021.    Allergies (verified) Azithromycin, Contrast media [iodinated contrast media], and Shellfish allergy   History: Past Medical History:   Diagnosis Date   Anxiety    Depression    Diverticulitis    Edema    Gallstones    Hypertension    Osteoarthritis    Past Surgical History:  Procedure Laterality Date   GALLBLADDER SURGERY     Family History  Problem Relation Age of Onset   Alcohol abuse Mother    Schizophrenia Maternal Grandmother    Coronary artery disease Paternal Grandmother        young age   Diabetes Paternal Grandmother    Hypertension Father    Diabetes Sister    Schizophrenia Maternal Aunt    Social History   Socioeconomic History   Marital status: Married    Spouse name: Not on file   Number of children: 3   Years of education: Not on file   Highest education level: Not on file  Occupational History   Occupation: Chartered certified accountant    Employer: [employer]  Tobacco Use   Smoking status: Every Day    Packs/day: 0.50    Years: 1.50    Pack years: 0.75    Types: Cigarettes   Smokeless tobacco: Never  Vaping Use   Vaping Use: Never used  Substance and Sexual Activity   Alcohol use: Yes    Alcohol/week: 2.0 standard drinks    Types: 1 Glasses of wine, 1 Cans of beer per week    Comment: occasionaly   Drug use: Yes    Types: Marijuana    Comment: stopped a couple of weeks ago   Sexual activity: Yes    Partners: Male    Birth control/protection: None  Other Topics Concern   Not on file  Social History Narrative   Not on file   Social Determinants of Health   Financial Resource Strain: Not on file  Food Insecurity: Not on file  Transportation Needs: Not on file  Physical Activity: Not on file  Stress: Not on file  Social Connections: Not on file    Tobacco Counseling Ready to quit: Not Answered Counseling given: Not Answered   Clinical Intake:     Pain : 0-10 Pain Location: Foot Pain Orientation: Left     Diabetes: No     Diabetic? NO         Activities of Daily Living In your present state of health, do you have any difficulty performing the following  activities: 04/13/2021  Hearing? Y  Vision? Y  Difficulty concentrating or making decisions? Y  Walking or climbing stairs? Y  Dressing or bathing? Y  Doing errands, shopping? Y  Comment Pt tries not to go out the house.  Preparing Food and eating ? Y  Comment Pt husband cooks dinner  Using the Toilet? N  In the past six months, have you accidently leaked urine? N  Comment Pt wears a pad  Do you have problems with loss of bowel control? N  Managing your Medications? N  Managing your Finances? Y  Comment Pt husband takes care of finances  Housekeeping or managing your Housekeeping? Y  Comment Husband takes care of hosuing  Some recent data might be hidden    Patient Care Team: Charlott Rakes, MD as PCP - General (Family Medicine)  Indicate any recent Medical Services you may have received from other than [facility] providers in the past year (date may be approximate).     Assessment:   This is a routine wellness examination for Natalija.  Hearing/Vision screen No results found.  Dietary issues and exercise activities discussed:     Goals Addressed   None   Depression Screen PHQ 2/9 Scores 04/13/2021 03/19/2018 10/02/2017 10/24/2016 11/16/2015 04/29/2015 03/13/2015  PHQ - 2 Score 3 6 6 2 6 5 6   PHQ- 9 Score 18 24 24 10 24 23  -  Exception Documentation - - - - - - Patient refusal  Some encounter information is confidential and restricted. Go to Review Flowsheets activity to see all data.    Fall Risk Fall Risk  04/13/2021 05/05/2020 06/27/2019 02/11/2019 03/19/2018  Falls in the past year? 1 0 0 0 0  Number falls in past yr: 0 0 - - -  Injury with Fall? 0 0 - - -    FALL RISK PREVENTION PERTAINING TO THE HOME:  Any stairs in or around the home? Yes  If so, are there any without handrails? Yes  Home free of loose throw rugs in walkways, pet beds, electrical cords, etc? Yes  Adequate lighting in your home to reduce risk of falls? Yes   ASSISTIVE DEVICES UTILIZED TO PREVENT  FALLS:  Life alert? No  Use of a cane, walker or w/c? No  Grab bars in the bathroom? No  Shower chair or bench in shower? No  Elevated toilet seat or a handicapped toilet? No   TIMED UP AND GO:  Was the test performed? Yes .  Length of time to ambulate 10 feet: Less than 5  sec.   Gait steady and fast without use of assistive device  Cognitive Function: MMSE - Mini Mental State Exam 04/13/2021  Orientation to time 5  Orientation to Place 5  Registration 3  Attention/ Calculation 0  Recall 3  Language- name 2 objects 2  Language- repeat 1  Language- follow 3 step command 3  Language- read & follow direction 1  Write a sentence 1  Copy design 1  Total score 25        Immunizations  There is no immunization history on file for this patient.  TDAP status: Due, Education has been provided regarding the importance of this vaccine. Advised may receive this vaccine at local pharmacy or Health Dept. Aware to provide a copy of the vaccination record if obtained from local pharmacy or Health Dept. Verbalized acceptance and understanding.  Flu Vaccine status: Due, Education has been provided regarding the importance of this vaccine. Advised may receive this vaccine at local pharmacy or Health Dept. Aware to provide a copy of the vaccination record if obtained from local pharmacy or Health Dept. Verbalized acceptance and understanding.  Pneumococcal vaccine status: Due, Education has been provided regarding the importance of this vaccine. Advised may receive this vaccine at local pharmacy or Health Dept. Aware to provide a copy of the vaccination record if obtained from local pharmacy or Health Dept. Verbalized acceptance and understanding.  Covid-19 vaccine status: Completed vaccines Pt was encouraged to obtain booster  Qualifies for Shingles Vaccine? Yes  Zostavax completed No   Shingrix Completed?: No.    Education has been provided regarding the importance of this vaccine.  Patient has been advised to call insurance company to determine out of pocket expense if they have not yet received this vaccine. Advised may also receive vaccine at local pharmacy or Health Dept. Verbalized acceptance and understanding.  Screening Tests Health Maintenance  Topic Date Due   COVID-19 Vaccine (1) Never done   Pneumococcal Vaccine 42-26 Years old (1 - PCV) Never done   Hepatitis C Screening  Never done   PAP SMEAR-Modifier  Never done   COLONOSCOPY (Pts 45-24yrs Insurance coverage will need to be confirmed)  Never done   TETANUS/TDAP  06/08/2011   Zoster Vaccines- Shingrix (1 of 2) Never done   MAMMOGRAM  11/04/2019   INFLUENZA VACCINE  11/02/2020   HIV Screening  Completed   HPV VACCINES  Aged Out    Health Maintenance  Health Maintenance Due  Topic Date Due   COVID-19 Vaccine (1) Never done   Pneumococcal Vaccine 70-35 Years old (1 - PCV) Never done   Hepatitis C Screening  Never done   PAP SMEAR-Modifier  Never done   COLONOSCOPY (Pts 45-55yrs Insurance coverage will need to be confirmed)  Never done   TETANUS/TDAP  06/08/2011   Zoster Vaccines- Shingrix (1 of 2) Never done   MAMMOGRAM  11/04/2019   INFLUENZA VACCINE  11/02/2020    Colorectal cancer screening: Referral to GI placed 04/13/2021. Pt aware the office will call re: appt.  Mammogram status: Ordered 08/10/2020. Pt provided with contact info and advised to call to schedule appt.   BMD pt doesn't qualify   Lung Cancer Screening: (Low Dose CT Chest recommended if Age 41-80 years, 30 pack-year currently smoking OR have quit w/in 15years.) does qualify.   Lung Cancer Screening Referral: Recommend to discuss with pcp   Additional Screening:  Hepatitis C Screening: does qualify;   Vision Screening: Recommended annual ophthalmology exams for early detection of glaucoma and other disorders of the eye. Is the patient up to date with their annual eye exam?  No  Who is the provider or what is the name of  the office in which the patient attends annual eye exams? None If pt is not established with a provider, would they like to be referred to a provider to establish care? None  Dental Screening: Recommended annual dental exams for proper oral hygiene  Community Resource Referral / Chronic Care Management: CRR required this visit?  No   CCM required this visit?  No      Plan:     I have personally reviewed and noted the following in the patients chart:   Medical and social history Use of alcohol, tobacco or illicit drugs  Current medications and supplements including opioid prescriptions. Patient is not currently taking opioid prescriptions. Functional ability and status Nutritional status Physical activity Advanced directives List of other physicians Hospitalizations, surgeries, and ER visits in previous 12 months Vitals Screenings to include cognitive, depression, and falls Referrals and appointments  In addition, I have reviewed and discussed with patient certain preventive protocols, quality metrics, and best practice recommendations. A written personalized care plan for preventive services as well as general preventive health recommendations were provided to patient.     Jackelyn Knife, RMA   04/13/2021   Nurse Notes: I  Spent 60 minutes on this telephone encounter

## 2021-04-16 ENCOUNTER — Encounter: Payer: Self-pay | Admitting: Internal Medicine

## 2021-04-16 DIAGNOSIS — F411 Generalized anxiety disorder: Secondary | ICD-10-CM | POA: Diagnosis not present

## 2021-04-16 DIAGNOSIS — I110 Hypertensive heart disease with heart failure: Secondary | ICD-10-CM | POA: Diagnosis not present

## 2021-04-16 DIAGNOSIS — F209 Schizophrenia, unspecified: Secondary | ICD-10-CM | POA: Diagnosis not present

## 2021-04-16 DIAGNOSIS — F319 Bipolar disorder, unspecified: Secondary | ICD-10-CM | POA: Diagnosis not present

## 2021-04-16 DIAGNOSIS — F419 Anxiety disorder, unspecified: Secondary | ICD-10-CM | POA: Diagnosis not present

## 2021-04-16 DIAGNOSIS — G629 Polyneuropathy, unspecified: Secondary | ICD-10-CM | POA: Diagnosis not present

## 2021-04-16 DIAGNOSIS — F332 Major depressive disorder, recurrent severe without psychotic features: Secondary | ICD-10-CM | POA: Diagnosis not present

## 2021-04-16 DIAGNOSIS — E876 Hypokalemia: Secondary | ICD-10-CM | POA: Diagnosis not present

## 2021-04-16 DIAGNOSIS — F17210 Nicotine dependence, cigarettes, uncomplicated: Secondary | ICD-10-CM | POA: Diagnosis not present

## 2021-04-16 DIAGNOSIS — G4700 Insomnia, unspecified: Secondary | ICD-10-CM | POA: Diagnosis not present

## 2021-04-16 DIAGNOSIS — E261 Secondary hyperaldosteronism: Secondary | ICD-10-CM | POA: Diagnosis not present

## 2021-05-07 ENCOUNTER — Other Ambulatory Visit (HOSPITAL_COMMUNITY): Payer: Self-pay

## 2021-05-07 ENCOUNTER — Ambulatory Visit (AMBULATORY_SURGERY_CENTER): Payer: Self-pay

## 2021-05-07 ENCOUNTER — Other Ambulatory Visit: Payer: Self-pay

## 2021-05-07 VITALS — Ht 60.0 in | Wt 200.0 lb

## 2021-05-07 DIAGNOSIS — Z1211 Encounter for screening for malignant neoplasm of colon: Secondary | ICD-10-CM

## 2021-05-07 MED ORDER — PEG 3350-KCL-NA BICARB-NACL 420 G PO SOLR
4000.0000 mL | Freq: Once | ORAL | 0 refills | Status: AC
  Filled 2021-05-07: qty 4000, 1d supply, fill #0

## 2021-05-18 ENCOUNTER — Other Ambulatory Visit (HOSPITAL_COMMUNITY): Payer: Self-pay

## 2021-05-18 ENCOUNTER — Telehealth: Payer: Self-pay | Admitting: Family Medicine

## 2021-05-18 ENCOUNTER — Ambulatory Visit: Payer: PPO | Attending: Family Medicine | Admitting: Family Medicine

## 2021-05-18 ENCOUNTER — Encounter: Payer: Self-pay | Admitting: Family Medicine

## 2021-05-18 VITALS — BP 113/81 | HR 91 | Ht 60.0 in | Wt 198.0 lb

## 2021-05-18 DIAGNOSIS — N951 Menopausal and female climacteric states: Secondary | ICD-10-CM | POA: Diagnosis not present

## 2021-05-18 DIAGNOSIS — K219 Gastro-esophageal reflux disease without esophagitis: Secondary | ICD-10-CM | POA: Diagnosis not present

## 2021-05-18 DIAGNOSIS — F3162 Bipolar disorder, current episode mixed, moderate: Secondary | ICD-10-CM | POA: Diagnosis not present

## 2021-05-18 DIAGNOSIS — I10 Essential (primary) hypertension: Secondary | ICD-10-CM | POA: Diagnosis not present

## 2021-05-18 DIAGNOSIS — M79672 Pain in left foot: Secondary | ICD-10-CM | POA: Diagnosis not present

## 2021-05-18 DIAGNOSIS — R600 Localized edema: Secondary | ICD-10-CM | POA: Diagnosis not present

## 2021-05-18 DIAGNOSIS — Z1231 Encounter for screening mammogram for malignant neoplasm of breast: Secondary | ICD-10-CM

## 2021-05-18 DIAGNOSIS — Z23 Encounter for immunization: Secondary | ICD-10-CM

## 2021-05-18 MED ORDER — CARVEDILOL 25 MG PO TABS
25.0000 mg | ORAL_TABLET | Freq: Two times a day (BID) | ORAL | 1 refills | Status: DC
  Filled 2021-05-18: qty 180, 90d supply, fill #0
  Filled 2021-09-09: qty 180, 90d supply, fill #1

## 2021-05-18 MED ORDER — MELOXICAM 7.5 MG PO TABS
7.5000 mg | ORAL_TABLET | Freq: Every day | ORAL | 1 refills | Status: DC
  Filled 2021-05-18: qty 30, 30d supply, fill #0

## 2021-05-18 MED ORDER — FUROSEMIDE 20 MG PO TABS
20.0000 mg | ORAL_TABLET | Freq: Every day | ORAL | 1 refills | Status: DC
  Filled 2021-05-18: qty 90, 90d supply, fill #0
  Filled 2021-09-09: qty 90, 90d supply, fill #1

## 2021-05-18 MED ORDER — ZOSTER VAC RECOMB ADJUVANTED 50 MCG/0.5ML IM SUSR: 0.5000 mL | Freq: Once | INTRAMUSCULAR | 1 refills | Status: AC

## 2021-05-18 MED ORDER — POTASSIUM CHLORIDE CRYS ER 20 MEQ PO TBCR
20.0000 meq | EXTENDED_RELEASE_TABLET | Freq: Every day | ORAL | 1 refills | Status: DC
  Filled 2021-05-18: qty 90, 90d supply, fill #0
  Filled 2021-09-09: qty 90, 90d supply, fill #1

## 2021-05-18 MED ORDER — CLONIDINE HCL 0.1 MG PO TABS
0.1000 mg | ORAL_TABLET | Freq: Every evening | ORAL | 1 refills | Status: DC | PRN
  Filled 2021-05-18: qty 90, 90d supply, fill #0
  Filled 2021-11-22: qty 90, 90d supply, fill #1

## 2021-05-18 MED ORDER — LOSARTAN POTASSIUM-HCTZ 100-25 MG PO TABS
1.0000 | ORAL_TABLET | Freq: Every day | ORAL | 1 refills | Status: DC
  Filled 2021-05-18: qty 90, 90d supply, fill #0
  Filled 2021-09-09: qty 90, 90d supply, fill #1

## 2021-05-18 MED ORDER — AMLODIPINE BESYLATE 5 MG PO TABS
5.0000 mg | ORAL_TABLET | Freq: Every day | ORAL | 1 refills | Status: DC
  Filled 2021-05-18: qty 90, 90d supply, fill #0
  Filled 2021-09-20: qty 90, 90d supply, fill #1

## 2021-05-18 MED ORDER — PANTOPRAZOLE SODIUM 40 MG PO TBEC
40.0000 mg | DELAYED_RELEASE_TABLET | Freq: Every day | ORAL | 1 refills | Status: DC
  Filled 2021-05-18: qty 90, 90d supply, fill #0
  Filled 2021-11-22: qty 90, 90d supply, fill #1

## 2021-05-18 NOTE — Progress Notes (Signed)
Discuss menopause.

## 2021-05-18 NOTE — Progress Notes (Signed)
Subjective:  Patient ID: Brandy [name], female    DOB: 10-Feb-1966  Age: 56 y.o. MRN: [id number]  CC: Hypertension   HPI Brandy [name] is a 56 y.o. year old female with a history of hypertension, bipolar disorder, vertigo, GERD, history of pulmonary edema who comes into the clinic for a follow-up visit  Interval History: She has complains of hotflashes which occur every night and she feels like if 'she does not get enough air she will hurt somebody'.  Symptoms are described as bothersome.  She is doing well on her antihypertensive and her PPI. She continues to have left foot pain which she had complained of at her visit 1 year ago and this is intermittent.  She was placed on a short course of prednisone and Voltaren gel.  She would like to be referred to orthopedics-Dr. Erlinda Hong.  She complains of being stressed that she is the major caregiver for her husband who recently underwent knee surgery and also undergoes hemodialysis.  She has to wheel him on the wheelchair to get on the transportation Lucianne Lei and providing case taking a toll on her as she feels she cannot even take care of herself. Her anxiety and depression is managed by mental health but she currently does not have a therapist. Past Medical History:  Diagnosis Date   Allergy    Anxiety    Depression    Diverticulitis    Edema    Gallstones    GERD (gastroesophageal reflux disease)    Hyperlipidemia    Hypertension    Osteoarthritis     Past Surgical History:  Procedure Laterality Date   GALLBLADDER SURGERY      Family History  Problem Relation Age of Onset   Alcohol abuse Mother    Hypertension Father    Diabetes Sister    Schizophrenia Maternal Aunt    Schizophrenia Maternal Grandmother    Coronary artery disease Paternal Grandmother        young age   Diabetes Paternal Grandmother    Colon cancer Neg Hx    Esophageal cancer Neg Hx    Stomach cancer Neg Hx    Rectal cancer Neg Hx      Allergies  Allergen Reactions   Azithromycin Other (See Comments)    "Stomach cramps"   Contrast Media [Iodinated Contrast Media] Hives   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"    Outpatient Medications Prior to Visit  Medication Sig Dispense Refill   albuterol (VENTOLIN HFA) 108 (90 Base) MCG/ACT inhaler Inhale 2 puffs into the lungs every 4 (four) hours as needed for wheezing or shortness of breath. 18 g 0   amLODipine (NORVASC) 5 MG tablet Take 1 tablet (5 mg total) by mouth daily. 90 tablet 0   aspirin EC 81 MG EC tablet Take 1 tablet (81 mg total) by mouth daily.     carvedilol (COREG) 25 MG tablet Take 1 tablet (25 mg total) by mouth 2 (two) times daily with a meal. 180 tablet 0   cetirizine (ZYRTEC) 10 MG tablet Take 1 tablet (10 mg total) by mouth daily. 30 tablet 1   DULoxetine (CYMBALTA) 60 MG capsule Take 1 capsule (60 mg total) by mouth daily. 90 capsule 0   fluticasone (FLONASE) 50 MCG/ACT nasal spray Place 1 spray into both nostrils daily. 16 g 1   furosemide (LASIX) 20 MG tablet Take 1 tablet (20 mg total) by mouth daily. 90 tablet 0  gabapentin (NEURONTIN) 100 MG capsule Take 1 capsule (100 mg total) by mouth 2 (two) times daily. 60 capsule 2   lamoTRIgine (LAMICTAL) 150 MG tablet Take 1 tablet (150 mg total) by mouth 2 (two) times daily. 180 tablet 0   losartan-hydrochlorothiazide (HYZAAR) 100-25 MG tablet Take 1 tablet by mouth daily. 90 tablet 0   Lurasidone HCl 120 MG TABS Take 1 tablet (120 mg total) by mouth daily with breakfast. 30 tablet 2   meloxicam (MOBIC) 7.5 MG tablet Take 1 tablet (7.5 mg total) by mouth daily. 30 tablet 1   meloxicam (MOBIC) 7.5 MG tablet TAKE 1 TABLET (7.5 MG TOTAL) BY MOUTH DAILY. 30 tablet 1   oxybutynin (DITROPAN) 5 MG tablet Take 1 tablet (5 mg total) by mouth 2 (two) times daily. 180 tablet 0   pantoprazole (PROTONIX) 40 MG tablet Take 1 tablet (40 mg total) by mouth daily. 90 tablet 0    potassium chloride SA (KLOR-CON M) 20 MEQ tablet Take 1 tablet (20 mEq total) by mouth daily. 90 tablet 0   temazepam (RESTORIL) 15 MG capsule Take 1 capsule (15 mg total) by mouth at bedtime. 30 capsule 2   traZODone (DESYREL) 150 MG tablet Take 1 tablet (150 mg total) by mouth at bedtime. 90 tablet 0   eszopiclone (LUNESTA) 2 MG TABS tablet TAKE 1 TABLET BY MOUTH IMMEDIATELY BEFORE BEDTIME AS NEEDED FOR SLEEP. (Patient not taking: No sig reported) 30 tablet 2   No facility-administered medications prior to visit.     ROS Review of Systems  Constitutional:  Negative for activity change, appetite change and fatigue.  HENT:  Negative for congestion, sinus pressure and sore throat.   Eyes:  Negative for visual disturbance.  Respiratory:  Negative for cough, chest tightness, shortness of breath and wheezing.   Cardiovascular:  Negative for chest pain and palpitations.  Gastrointestinal:  Negative for abdominal distention, abdominal pain and constipation.  Endocrine: Negative for polydipsia.  Genitourinary:  Negative for dysuria and frequency.  Musculoskeletal:        See HPI  Skin:  Negative for rash.  Neurological:  Negative for tremors, light-headedness and numbness.  Hematological:  Does not bruise/bleed easily.  Psychiatric/Behavioral:  Positive for dysphoric mood. Negative for agitation and behavioral problems.    Objective:  BP 113/81    Pulse 91    Ht 5' (1.524 m)    Wt 198 lb (89.8 kg)    SpO2 99%    BMI 38.67 kg/m   BP/Weight 05/18/2021 0/10/8673 07/07/9199  Systolic BP 007 - 121  Diastolic BP 81 - 975  Wt. (Lbs) 198 200 200.2  BMI 38.67 39.06 39.1  Some encounter information is confidential and restricted. Go to Review Flowsheets activity to see all data.      Physical Exam Constitutional:      Appearance: She is well-developed.  Cardiovascular:     Rate and Rhythm: Normal rate.     Heart sounds: Normal heart sounds. No murmur heard. Pulmonary:     Effort: Pulmonary  effort is normal.     Breath sounds: Normal breath sounds. No wheezing or rales.  Chest:     Chest wall: No tenderness.  Abdominal:     General: Bowel sounds are normal. There is no distension.     Palpations: Abdomen is soft. There is no mass.     Tenderness: There is no abdominal tenderness.  Musculoskeletal:        General: Normal range of motion.  Right lower leg: No edema.     Left lower leg: No edema.  Neurological:     Mental Status: She is alert and oriented to person, place, and time.  Psychiatric:     Comments: Dysphoric mood    CMP Latest Ref Rng & Units 05/05/2020 03/19/2018 10/02/2017  Glucose 65 - 99 mg/dL 122(H) 101(H) 92  BUN 6 - 24 mg/dL _0 Creatinine 0.57 - 1.00 mg/dL 1.13(H) 1.01(H) 1.14(H)  Sodium 134 - 144 mmol/L 143 140 140  Potassium 3.5 - 5.2 mmol/L 4.2 3.9 4.2  Chloride 96 - 106 mmol/L 102 99 101  CO2 20 - 29 mmol/L _1 Calcium 8.7 - 10.2 mg/dL 10.1 10.1 10.3(H)  Total Protein 6.0 - 8.5 g/dL 7.8 - 7.7  Total Bilirubin 0.0 - 1.2 mg/dL 0.2 - <0.2  Alkaline Phos 44 - 121 IU/L 94 - 78  AST 0 - 40 IU/L 12 - 16  ALT 0 - 32 IU/L 15 - 17    Lipid Panel     Component Value Date/Time   CHOL 193 05/05/2020 1053   TRIG 215 (H) 05/05/2020 1053   HDL 49 05/05/2020 1053   CHOLHDL 3.9 05/05/2020 1053   CHOLHDL 3.1 02/03/2015 0420   VLDL 24 02/03/2015 0420   LDLCALC 107 (H) 05/05/2020 1053    CBC    Component Value Date/Time   WBC 5.7 10/08/2015 0815   RBC 4.27 10/08/2015 0815   HGB 12.2 10/08/2015 0815   HCT 37.6 10/08/2015 0815   PLT 220 10/08/2015 0815   MCV 88.1 10/08/2015 0815   MCH 28.6 10/08/2015 0815   MCHC 32.4 10/08/2015 0815   RDW 15.1 10/08/2015 0815   LYMPHSABS 1.8 10/08/2015 0815   MONOABS 0.2 10/08/2015 0815   EOSABS 0.2 10/08/2015 0815   BASOSABS 0.0 10/08/2015 0815    Lab Results  Component Value Date   HGBA1C 5.70 04/29/2015    Assessment & Plan:  1. Essential hypertension Controlled Continue current  regimen Counseled on blood pressure goal of less than 130/80, low-sodium, DASH diet, medication compliance, 150 minutes of moderate intensity exercise per week. Discussed medication compliance, adverse effects. - CMP14+EGFR - amLODipine (NORVASC) 5 MG tablet; Take 1 tablet (5 mg total) by mouth daily.  Dispense: 90 tablet; Refill: 1 - carvedilol (COREG) 25 MG tablet; Take 1 tablet (25 mg total) by mouth 2 (two) times daily with a meal.  Dispense: 180 tablet; Refill: 1 - losartan-hydrochlorothiazide (HYZAAR) 100-25 MG tablet; Take 1 tablet by mouth daily.  Dispense: 90 tablet; Refill: 1  2. Pedal edema Controlled - furosemide (LASIX) 20 MG tablet; Take 1 tablet (20 mg total) by mouth daily.  Dispense: 90 tablet; Refill: 1  3. Gastroesophageal reflux disease without esophagitis Controlled - pantoprazole (PROTONIX) 40 MG tablet; Take 1 tablet (40 mg total) by mouth daily.  Dispense: 90 tablet; Refill: 1  4. Left foot pain Uncontrolled Would like to refer for PT and x-ray but she would like to see an orthopedic hand to have placed referral - AMB referral to orthopedics - meloxicam (MOBIC) 7.5 MG tablet; Take 1 tablet (7.5 mg total) by mouth daily.  Dispense: 30 tablet; Refill: 1  5. Bipolar disorder, current episode mixed, moderate ([place]) Exacerbated by underlying caregiver stress We will send message to LCSW to assist her in securing a counselor Also advised that she could reach out to her husband's PCP to refer him for PCA services Management per psychiatrist  6. Encounter for  screening mammogram for malignant neoplasm of breast - MM 3D SCREEN BREAST BILATERAL; Future  7. Vasomotor symptoms due to menopause Uncontrolled Discussed available treatment options including hormonal and nonhormonal We will proceed with clonidine and if this is ineffective refer to GYN - cloNIDine (CATAPRES) 0.1 MG tablet; Take 1 tablet (0.1 mg total) by mouth at bedtime as needed for hot flashes.  Dispense:  90 tablet; Refill: 1   Health Care Maintenance: Will address at next visit along with lung cancer screening No orders of the defined types were placed in this encounter.  Return in about 2 months (around 07/16/2021) for Complete physical exam.       Charlott Rakes, MD, FAAFP. [facility] and [place], [place]   05/18/2021, 3:38 PM

## 2021-05-18 NOTE — Telephone Encounter (Signed)
Please assist in securing a counselor. Followed by Psych already. Thanks

## 2021-05-19 ENCOUNTER — Encounter: Payer: Self-pay | Admitting: Family Medicine

## 2021-05-19 LAB — CMP14+EGFR
ALT: 16 IU/L (ref 0–32)
AST: 13 IU/L (ref 0–40)
Albumin/Globulin Ratio: 1.4 (ref 1.2–2.2)
Albumin: 4.6 g/dL (ref 3.8–4.9)
Alkaline Phosphatase: 84 IU/L (ref 44–121)
BUN/Creatinine Ratio: 16 (ref 9–23)
BUN: 13 mg/dL (ref 6–24)
Bilirubin Total: 0.2 mg/dL (ref 0.0–1.2)
CO2: 30 mmol/L — ABNORMAL HIGH (ref 20–29)
Calcium: 10.7 mg/dL — ABNORMAL HIGH (ref 8.7–10.2)
Chloride: 101 mmol/L (ref 96–106)
Creatinine, Ser: 0.83 mg/dL (ref 0.57–1.00)
Globulin, Total: 3.2 g/dL (ref 1.5–4.5)
Glucose: 137 mg/dL — ABNORMAL HIGH (ref 70–99)
Potassium: 4.2 mmol/L (ref 3.5–5.2)
Sodium: 144 mmol/L (ref 134–144)
Total Protein: 7.8 g/dL (ref 6.0–8.5)
eGFR: 83 mL/min/{1.73_m2} (ref >59)

## 2021-05-20 ENCOUNTER — Telehealth: Payer: Self-pay

## 2021-05-20 NOTE — Telephone Encounter (Signed)
-----   Message from Charlott Rakes, MD sent at 05/19/2021  8:59 AM EST ----- Please inform the patient that labs are normal. Thank you.

## 2021-05-20 NOTE — Telephone Encounter (Signed)
Patient was called and a voicemail was left informing patient to return phone call for lab results.   CRM created.

## 2021-05-25 ENCOUNTER — Encounter: Payer: Self-pay | Admitting: Family

## 2021-05-25 ENCOUNTER — Ambulatory Visit (INDEPENDENT_AMBULATORY_CARE_PROVIDER_SITE_OTHER): Payer: PPO

## 2021-05-25 ENCOUNTER — Other Ambulatory Visit: Payer: Self-pay

## 2021-05-25 ENCOUNTER — Ambulatory Visit (INDEPENDENT_AMBULATORY_CARE_PROVIDER_SITE_OTHER): Payer: PPO | Admitting: Family

## 2021-05-25 ENCOUNTER — Encounter: Payer: Self-pay | Admitting: Internal Medicine

## 2021-05-25 DIAGNOSIS — M25572 Pain in left ankle and joints of left foot: Secondary | ICD-10-CM

## 2021-05-25 NOTE — Progress Notes (Signed)
Office Visit Note   Patient: Brandy [name]           Date of Birth: [birth year]           MRN: [id number] Visit Date: 05/25/2021              Requested by: Charlott Rakes, MD [place],  [place] [address] PCP: Charlott Rakes, MD  Chief Complaint  Patient presents with   Left Foot - Pain      HPI: The patient is a 56 year old woman who presents today for evaluation of left lateral foot pain this has been ongoing for over a year now she has no pain at rest however her pain is brought on by weightbearing it is primarily over the dorsum of her foot into the ankle.  She is not had any associated injury.  It has been waxing and waning for the last year she does not have any swelling she does have some pain with touching this  Denies a history of gout  Assessment & Plan: Visit Diagnoses: No diagnosis found.  Plan: Depo-Medrol injection of the left ankle today. impression is impingement left ankle.  Discussed orthotics supportive shoewear as well  Follow-Up Instructions: No follow-ups on file.   Left Ankle Exam   Tenderness  Left ankle tenderness location: Sinus Tarsi.  Swelling: none  Range of Motion  The patient has normal left ankle ROM.   Muscle Strength  The patient has normal left ankle strength.  Tests  Anterior drawer: negative Varus tilt: negative  Other  Erythema: absent Pulse: present  Comments:  No pain with resisted eversion or inversion     Patient is alert, oriented, no adenopathy, well-dressed, normal affect, normal respiratory effort.   Imaging: No results found. No images are attached to the encounter.  Labs: Lab Results  Component Value Date   HGBA1C 5.70 04/29/2015   HGBA1C 5.7 (H) 02/04/2015   REPTSTATUS 04/27/2013 FINAL 04/25/2013   GRAMSTAIN  11/05/2012    MODERATE WBC PRESENT, PREDOMINANTLY PMN NO SQUAMOUS EPITHELIAL CELLS SEEN MODERATE GRAM POSITIVE COCCI IN PAIRS Performed at [facility]  04/25/2013    No Beta Hemolytic Streptococci Isolated Performed at [facility]     Lab Results  Component Value Date   ALBUMIN 4.6 05/18/2021   ALBUMIN 4.8 05/05/2020   ALBUMIN 4.3 10/02/2017    No results found for: MG No results found for: VD25OH  No results found for: PREALBUMIN CBC EXTENDED Latest Ref Rng & Units 10/08/2015 02/05/2015 02/03/2015  WBC 4.0 - 10.5 K/uL 5.7 5.6 5.8  RBC 3.87 - 5.11 MIL/uL 4.27 4.70 4.86  HGB 12.0 - 15.0 g/dL 12.2 13.1 13.7  HCT 36.0 - 46.0 % 37.6 40.6 41.7  PLT 150 - 400 K/uL 220 217 223  NEUTROABS 1.7 - 7.7 K/uL 3.4 - -  LYMPHSABS 0.7 - 4.0 K/uL 1.8 - -     There is no height or weight on file to calculate BMI.  Orders:  No orders of the defined types were placed in this encounter.  No orders of the defined types were placed in this encounter.    Procedures: Medium Joint Inj: L ankle on 05/25/2021 11:06 AM Indications: pain Details: 25 G needle, anterolateral approach Medications: 2 mL lidocaine 1 %; 40 mg methylPREDNISolone acetate 40 MG/ML Outcome: tolerated well, no immediate complications Consent was given by the patient. Patient was prepped and draped in the usual sterile fashion.  Clinical Data: No additional findings.  ROS:  All other systems negative, except as noted in the HPI. Review of Systems  Constitutional:  Positive for chills. Negative for fever.  Musculoskeletal:  Positive for arthralgias. Negative for joint swelling.   Objective: Vital Signs: There were no vitals taken for this visit.  Specialty Comments:  No specialty comments available.  PMFS History: Patient Active Problem List   Diagnosis Date Noted   Obesity (BMI 35.0-39.9 without comorbidity) 10/24/2016   Bipolar disorder ([facility]) 04/29/2015   Insomnia 03/13/2015   Major depression, recurrent ([facility]) 03/10/2015   Generalized anxiety disorder 03/10/2015   Dizziness and giddiness 02/20/2015   GERD (gastroesophageal reflux  disease) 02/20/2015   Abnormal CXR    Dyspnea 02/02/2015   Hypoxia 02/02/2015   Acute pulmonary edema ([place]) 02/02/2015   Hypertensive urgency 02/02/2015   Hypertensive crisis    Nausea with vomiting 06/09/2011   HTN (hypertension) 12/02/2010   Diverticulitis of sigmoid colon 12/02/2010   Tobacco dependence 12/02/2010   Past Medical History:  Diagnosis Date   Allergy    Anxiety    Depression    Diverticulitis    Edema    Gallstones    GERD (gastroesophageal reflux disease)    Hyperlipidemia    Hypertension    Osteoarthritis     Family History  Problem Relation Age of Onset   Alcohol abuse Mother    Hypertension Father    Diabetes Sister    Schizophrenia Maternal Aunt    Schizophrenia Maternal Grandmother    Coronary artery disease Paternal Grandmother        young age   Diabetes Paternal Grandmother    Colon cancer Neg Hx    Esophageal cancer Neg Hx    Stomach cancer Neg Hx    Rectal cancer Neg Hx     Past Surgical History:  Procedure Laterality Date   GALLBLADDER SURGERY     Social History   Occupational History   Occupation: Research scientist (life sciences): [employer]  Tobacco Use   Smoking status: Every Day    Packs/day: 0.50    Years: 1.50    Pack years: 0.75    Types: Cigarettes   Smokeless tobacco: Never  Vaping Use   Vaping Use: Never used  Substance and Sexual Activity   Alcohol use: Yes    Alcohol/week: 2.0 standard drinks    Types: 1 Glasses of wine, 1 Cans of beer per week    Comment: occasionaly   Drug use: Yes    Types: Marijuana    Comment: stopped a couple of weeks ago   Sexual activity: Yes    Partners: Male    Birth control/protection: None

## 2021-05-26 MED ORDER — METHYLPREDNISOLONE ACETATE 40 MG/ML IJ SUSP
40.0000 mg | INTRAMUSCULAR | Status: AC | PRN
  Administered 2021-05-25: 40 mg via INTRA_ARTICULAR

## 2021-05-26 MED ORDER — LIDOCAINE HCL 1 % IJ SOLN
2.0000 mL | INTRAMUSCULAR | Status: AC | PRN
  Administered 2021-05-25: 2 mL

## 2021-05-28 ENCOUNTER — Encounter: Payer: Self-pay | Admitting: Internal Medicine

## 2021-05-28 ENCOUNTER — Other Ambulatory Visit: Payer: Self-pay

## 2021-05-28 ENCOUNTER — Ambulatory Visit (AMBULATORY_SURGERY_CENTER): Payer: PPO | Admitting: Internal Medicine

## 2021-05-28 VITALS — BP 120/81 | HR 85 | Temp 97.9°F | Resp 18 | Ht 60.0 in | Wt 200.0 lb

## 2021-05-28 DIAGNOSIS — D125 Benign neoplasm of sigmoid colon: Secondary | ICD-10-CM

## 2021-05-28 DIAGNOSIS — Z1211 Encounter for screening for malignant neoplasm of colon: Secondary | ICD-10-CM | POA: Diagnosis not present

## 2021-05-28 DIAGNOSIS — F319 Bipolar disorder, unspecified: Secondary | ICD-10-CM | POA: Diagnosis not present

## 2021-05-28 DIAGNOSIS — D124 Benign neoplasm of descending colon: Secondary | ICD-10-CM

## 2021-05-28 DIAGNOSIS — K635 Polyp of colon: Secondary | ICD-10-CM

## 2021-05-28 DIAGNOSIS — I10 Essential (primary) hypertension: Secondary | ICD-10-CM | POA: Diagnosis not present

## 2021-05-28 MED ORDER — SODIUM CHLORIDE 0.9 % IV SOLN: 500.0000 mL | Freq: Once | INTRAVENOUS | Status: DC

## 2021-05-28 NOTE — Op Note (Signed)
[facility] Patient Name: Brandy [name] Procedure Date: 05/28/2021 2:04 PM MRN: [id number] Endoscopist: Jerene Bears , MD Age: 56 Referring MD:  Date of Birth: Feb 07, 1966 Gender: Female Account #: [id number] Procedure:                Colonoscopy Indications:              Screening for colorectal malignant neoplasm Medicines:                Monitored Anesthesia Care Procedure:                Pre-Anesthesia Assessment:                           - Prior to the procedure, a History and Physical                            was performed, and patient medications and                            allergies were reviewed. The patient's tolerance of                            previous anesthesia was also reviewed. The risks                            and benefits of the procedure and the sedation                            options and risks were discussed with the patient.                            All questions were answered, and informed consent                            was obtained. Prior Anticoagulants: The patient has                            taken no previous anticoagulant or antiplatelet                            agents. ASA Grade Assessment: II - A patient with                            mild systemic disease. After reviewing the risks                            and benefits, the patient was deemed in                            satisfactory condition to undergo the procedure.                           After obtaining informed consent, the colonoscope  was passed under direct vision. Throughout the                            procedure, the patient's blood pressure, pulse, and                            oxygen saturations were monitored continuously. The                            Olympus PCF-H190DL (#[id number]) Colonoscope was                            introduced through the anus and advanced to the                            cecum, identified by  appendiceal orifice and                            ileocecal valve. The colonoscopy was performed                            without difficulty. The patient tolerated the                            procedure well. The quality of the bowel                            preparation was good. The ileocecal valve,                            appendiceal orifice, and rectum were photographed. Scope In: 2:15:47 PM Scope Out: 2:29:35 PM Scope Withdrawal Time: 0 hours 10 minutes 0 seconds  Total Procedure Duration: 0 hours 13 minutes 48 seconds  Findings:                 Skin tags were found on perianal exam.                           Two sessile polyps were found in the sigmoid colon                            and descending colon. The polyps were 3 to 4 mm in                            size. These polyps were removed with a cold snare.                            Resection and retrieval were complete.                           Multiple small-mouthed diverticula were found from                            cecum to sigmoid colon.  Internal hemorrhoids were found during                            retroflexion. The hemorrhoids were small. Complications:            No immediate complications. Estimated Blood Loss:     Estimated blood loss was minimal. Impression:               - Perianal skin tags found on perianal exam.                           - Two 3 to 4 mm polyps in the sigmoid colon and in                            the descending colon, removed with a cold snare.                            Resected and retrieved.                           - Diverticulosis from cecum to sigmoid colon.                           - Internal hemorrhoids. Recommendation:           - Patient has a contact number available for                            emergencies. The signs and symptoms of potential                            delayed complications were discussed with the                             patient. Return to normal activities tomorrow.                            Written discharge instructions were provided to the                            patient.                           - Resume previous diet.                           - Continue present medications.                           - Await pathology results.                           - Repeat colonoscopy is recommended. The                            colonoscopy date will be determined after pathology  results from today's exam become available for                            review. Jerene Bears, MD 05/28/2021 2:41:00 PM This report has been signed electronically.

## 2021-05-28 NOTE — Progress Notes (Signed)
GASTROENTEROLOGY PROCEDURE H&P NOTE   Primary Care Physician: Charlott Rakes, MD    Reason for Procedure:  Colon cancer screening  Plan:    Colonoscopy  Patient is appropriate for endoscopic procedure(s) in the ambulatory ([facility]) setting.  The nature of the procedure, as well as the risks, benefits, and alternatives were carefully and thoroughly reviewed with the patient. Ample time for discussion and questions allowed. The patient understood, was satisfied, and agreed to proceed.     HPI: Brandy [name] is a 56 y.o. female who presents for colonoscopy.  Medical history as below.  Tolerated the prep.  No recent chest pain or shortness of breath.  No abdominal pain today.  Past Medical History:  Diagnosis Date   Allergy    Anxiety    Depression    Diverticulitis    Edema    Gallstones    GERD (gastroesophageal reflux disease)    Hyperlipidemia    Hypertension    Osteoarthritis     Past Surgical History:  Procedure Laterality Date   GALLBLADDER SURGERY      Prior to Admission medications   Medication Sig Start Date End Date Taking? Authorizing Provider  amLODipine (NORVASC) 5 MG tablet Take 1 tablet (5 mg total) by mouth daily. 05/18/21  Yes Charlott Rakes, MD  aspirin EC 81 MG EC tablet Take 1 tablet (81 mg total) by mouth daily. 02/05/15  Yes Domenic Polite, MD  carvedilol (COREG) 25 MG tablet Take 1 tablet (25 mg total) by mouth 2 (two) times daily with a meal. 05/18/21  Yes Newlin, Enobong, MD  DULoxetine (CYMBALTA) 60 MG capsule Take 1 capsule (60 mg total) by mouth daily. 03/12/21  Yes Arfeen, Arlyce Harman, MD  furosemide (LASIX) 20 MG tablet Take 1 tablet (20 mg total) by mouth daily. 05/18/21  Yes Charlott Rakes, MD  lamoTRIgine (LAMICTAL) 150 MG tablet Take 1 tablet (150 mg total) by mouth 2 (two) times daily. 03/12/21  Yes Arfeen, Arlyce Harman, MD  losartan-hydrochlorothiazide (HYZAAR) 100-25 MG tablet Take 1 tablet by mouth daily. 05/18/21  Yes Charlott Rakes,  MD  Lurasidone HCl 120 MG TABS Take 1 tablet (120 mg total) by mouth daily with breakfast. 03/12/21  Yes Arfeen, Arlyce Harman, MD  pantoprazole (PROTONIX) 40 MG tablet Take 1 tablet (40 mg total) by mouth daily. 05/18/21  Yes Charlott Rakes, MD  potassium chloride SA (KLOR-CON M) 20 MEQ tablet Take 1 tablet (20 mEq total) by mouth daily. 05/18/21  Yes Newlin, Charlane Ferretti, MD  temazepam (RESTORIL) 15 MG capsule Take 1 capsule (15 mg total) by mouth at bedtime. 10/25/19  Yes Arfeen, Arlyce Harman, MD  albuterol (VENTOLIN HFA) 108 (90 Base) MCG/ACT inhaler Inhale 2 puffs into the lungs every 4 (four) hours as needed for wheezing or shortness of breath. 09/16/19   Hall-Potvin, [place], PA-C  cetirizine (ZYRTEC) 10 MG tablet Take 1 tablet (10 mg total) by mouth daily. 09/16/19   Hall-Potvin, [place], PA-C  cloNIDine (CATAPRES) 0.1 MG tablet Take 1 tablet (0.1 mg total) by mouth at bedtime as needed for hot flashes. Patient not taking: Reported on 05/28/2021 05/18/21   Charlott Rakes, MD  eszopiclone (LUNESTA) 2 MG TABS tablet TAKE 1 TABLET BY MOUTH IMMEDIATELY BEFORE BEDTIME AS NEEDED FOR SLEEP. Patient not taking: Reported on 04/20/2020 01/27/20 07/25/20  Arfeen, Arlyce Harman, MD  fluticasone (FLONASE) 50 MCG/ACT nasal spray Place 1 spray into both nostrils daily. 09/16/19   Hall-Potvin, [place], PA-C  gabapentin (NEURONTIN) 100 MG capsule Take 1 capsule (  100 mg total) by mouth 2 (two) times daily. 03/12/21   Arfeen, Arlyce Harman, MD  meloxicam (MOBIC) 7.5 MG tablet Take 1 tablet (7.5 mg total) by mouth daily. 05/18/21   Charlott Rakes, MD  traZODone (DESYREL) 150 MG tablet Take 1 tablet (150 mg total) by mouth at bedtime. 03/12/21   Arfeen, Arlyce Harman, MD  losartan (COZAAR) 100 MG tablet Take 1 tablet (100 mg total) by mouth daily. 03/16/20 05/05/20  Charlott Rakes, MD    Current Outpatient Medications  Medication Sig Dispense Refill   amLODipine (NORVASC) 5 MG tablet Take 1 tablet (5 mg total) by mouth daily. 90 tablet 1   aspirin EC 81  MG EC tablet Take 1 tablet (81 mg total) by mouth daily.     carvedilol (COREG) 25 MG tablet Take 1 tablet (25 mg total) by mouth 2 (two) times daily with a meal. 180 tablet 1   DULoxetine (CYMBALTA) 60 MG capsule Take 1 capsule (60 mg total) by mouth daily. 90 capsule 0   furosemide (LASIX) 20 MG tablet Take 1 tablet (20 mg total) by mouth daily. 90 tablet 1   lamoTRIgine (LAMICTAL) 150 MG tablet Take 1 tablet (150 mg total) by mouth 2 (two) times daily. 180 tablet 0   losartan-hydrochlorothiazide (HYZAAR) 100-25 MG tablet Take 1 tablet by mouth daily. 90 tablet 1   Lurasidone HCl 120 MG TABS Take 1 tablet (120 mg total) by mouth daily with breakfast. 30 tablet 2   pantoprazole (PROTONIX) 40 MG tablet Take 1 tablet (40 mg total) by mouth daily. 90 tablet 1   potassium chloride SA (KLOR-CON M) 20 MEQ tablet Take 1 tablet (20 mEq total) by mouth daily. 90 tablet 1   temazepam (RESTORIL) 15 MG capsule Take 1 capsule (15 mg total) by mouth at bedtime. 30 capsule 2   albuterol (VENTOLIN HFA) 108 (90 Base) MCG/ACT inhaler Inhale 2 puffs into the lungs every 4 (four) hours as needed for wheezing or shortness of breath. 18 g 0   cetirizine (ZYRTEC) 10 MG tablet Take 1 tablet (10 mg total) by mouth daily. 30 tablet 1   cloNIDine (CATAPRES) 0.1 MG tablet Take 1 tablet (0.1 mg total) by mouth at bedtime as needed for hot flashes. (Patient not taking: Reported on 05/28/2021) 90 tablet 1   eszopiclone (LUNESTA) 2 MG TABS tablet TAKE 1 TABLET BY MOUTH IMMEDIATELY BEFORE BEDTIME AS NEEDED FOR SLEEP. (Patient not taking: Reported on 04/20/2020) 30 tablet 2   fluticasone (FLONASE) 50 MCG/ACT nasal spray Place 1 spray into both nostrils daily. 16 g 1   gabapentin (NEURONTIN) 100 MG capsule Take 1 capsule (100 mg total) by mouth 2 (two) times daily. 60 capsule 2   meloxicam (MOBIC) 7.5 MG tablet Take 1 tablet (7.5 mg total) by mouth daily. 30 tablet 1   traZODone (DESYREL) 150 MG tablet Take 1 tablet (150 mg total) by  mouth at bedtime. 90 tablet 0   Current Facility-Administered Medications  Medication Dose Route Frequency Provider Last Rate Last Admin   0.9 %  sodium chloride infusion  500 mL Intravenous Once Cameryn Chrisley, Lajuan Lines, MD        Allergies as of 05/28/2021 - Review Complete 05/28/2021  Allergen Reaction Noted   Azithromycin Other (See Comments) 12/02/2010   Contrast media [iodinated contrast media] Hives 01/07/2011   Shellfish allergy Swelling and Other (See Comments) 12/02/2010    Family History  Problem Relation Age of Onset   Alcohol abuse Mother  Hypertension Father    Diabetes Sister    Schizophrenia Maternal Aunt    Schizophrenia Maternal Grandmother    Coronary artery disease Paternal Grandmother        young age   Diabetes Paternal Grandmother    Colon cancer Neg Hx    Esophageal cancer Neg Hx    Stomach cancer Neg Hx    Rectal cancer Neg Hx     Social History   Socioeconomic History   Marital status: Married    Spouse name: Not on file   Number of children: 3   Years of education: Not on file   Highest education level: Not on file  Occupational History   Occupation: Research scientist (life sciences): [employer]  Tobacco Use   Smoking status: Every Day    Packs/day: 0.50    Years: 1.50    Pack years: 0.75    Types: Cigarettes   Smokeless tobacco: Never  Vaping Use   Vaping Use: Never used  Substance and Sexual Activity   Alcohol use: Yes    Alcohol/week: 2.0 standard drinks    Types: 1 Glasses of wine, 1 Cans of beer per week    Comment: occasionaly   Drug use: Yes    Types: Marijuana    Comment: smoked wednesday.   Sexual activity: Yes    Partners: Male    Birth control/protection: None    Comment: last period was approximately 6 months per pt.  She repors she could not be pregnant.  Other Topics Concern   Not on file  Social History Narrative   Not on file   Social Determinants of Health   Financial Resource Strain: Not on file  Food Insecurity: Not on  file  Transportation Needs: Not on file  Physical Activity: Not on file  Stress: Not on file  Social Connections: Not on file  Intimate Partner Violence: Not on file    Physical Exam: Vital signs in last 24 hours: @BP  134/78    Pulse 85    Temp 97.9 F (36.6 C)    Ht 5' (1.524 m)    Wt 200 lb (90.7 kg)    SpO2 98%    BMI 39.06 kg/m  GEN: NAD EYE: Sclerae anicteric ENT: MMM CV: Non-tachycardic Pulm: CTA b/l GI: Soft, NT/ND NEURO:  Alert & Oriented x 3   Zenovia Jarred, MD [facility]  05/28/2021 2:06 PM

## 2021-05-28 NOTE — Progress Notes (Signed)
Report to PACU, RN, vss, BBS= Clear.  

## 2021-05-28 NOTE — Progress Notes (Signed)
CNW - VS  Pt's states no medical or surgical changes since previsit or office visit.

## 2021-05-28 NOTE — Progress Notes (Signed)
Called to room to assist during endoscopic procedure.  Patient ID and intended procedure confirmed with present staff. Received instructions for my participation in the procedure from the performing physician.  

## 2021-05-28 NOTE — Patient Instructions (Signed)
Information on polyps, diverticulosis and hemorrhoids given to you.  Await pathology results.  Resume previous diet and medications.   YOU HAD AN ENDOSCOPIC PROCEDURE TODAY AT [facility]:   Refer to the procedure report that was given to you for any specific questions about what was found during the examination.  If the procedure report does not answer your questions, please call your gastroenterologist to clarify.  If you requested that your care partner not be given the details of your procedure findings, then the procedure report has been included in a sealed envelope for you to review at your convenience later.  YOU SHOULD EXPECT: Some feelings of bloating in the abdomen. Passage of more gas than usual.  Walking can help get rid of the air that was put into your GI tract during the procedure and reduce the bloating. If you had a lower endoscopy (such as a colonoscopy or flexible sigmoidoscopy) you may notice spotting of blood in your stool or on the toilet paper. If you underwent a bowel prep for your procedure, you may not have a normal bowel movement for a few days.  Please Note:  You might notice some irritation and congestion in your nose or some drainage.  This is from the oxygen used during your procedure.  There is no need for concern and it should clear up in a day or so.  SYMPTOMS TO REPORT IMMEDIATELY:  Following lower endoscopy (colonoscopy or flexible sigmoidoscopy):  Excessive amounts of blood in the stool  Significant tenderness or worsening of abdominal pains  Swelling of the abdomen that is new, acute  Fever of 100F or higher   For urgent or emergent issues, a gastroenterologist can be reached at any hour by calling [phone]. Do not use MyChart messaging for urgent concerns.    DIET:  We do recommend a small meal at first, but then you may proceed to your regular diet.  Drink plenty of fluids but you should avoid alcoholic beverages for 24  hours.  ACTIVITY:  You should plan to take it easy for the rest of today and you should NOT DRIVE or use heavy machinery until tomorrow (because of the sedation medicines used during the test).    FOLLOW UP: Our staff will call the number listed on your records 48-72 hours following your procedure to check on you and address any questions or concerns that you may have regarding the information given to you following your procedure. If we do not reach you, we will leave a message.  We will attempt to reach you two times.  During this call, we will ask if you have developed any symptoms of COVID 19. If you develop any symptoms (ie: fever, flu-like symptoms, shortness of breath, cough etc.) before then, please call [phone].  If you test positive for Covid 19 in the 2 weeks post procedure, please call and report this information to [name].    If any biopsies were taken you will be contacted by phone or by letter within the next 1-3 weeks.  Please call us at [phone] if you have not heard about the biopsies in 3 weeks.    SIGNATURES/CONFIDENTIALITY: You and/or your care partner have signed paperwork which will be entered into your electronic medical record.  These signatures attest to the fact that that the information above on your After Visit Summary has been reviewed and is understood.  Full responsibility of the confidentiality of this discharge information lies with you and/or  your care-partner.

## 2021-06-01 ENCOUNTER — Telehealth: Payer: Self-pay | Admitting: *Deleted

## 2021-06-01 NOTE — Telephone Encounter (Signed)

## 2021-06-04 NOTE — Telephone Encounter (Signed)
Hi Alinda Sierras do you know anyone that accepts health team advantage? I am not sure of anyone unfortunately.

## 2021-06-07 ENCOUNTER — Encounter: Payer: Self-pay | Admitting: Internal Medicine

## 2021-06-08 NOTE — Telephone Encounter (Signed)
I spoke with pt and let her know that she could call [facility] and schedule an appt with a therapist or let her psychiatrist know at her next appt on 06/18/21 that she wants to see a therapist. She states that she will do this. ?

## 2021-06-15 ENCOUNTER — Ambulatory Visit: Payer: PPO | Admitting: Family

## 2021-06-16 ENCOUNTER — Ambulatory Visit (INDEPENDENT_AMBULATORY_CARE_PROVIDER_SITE_OTHER): Payer: PPO | Admitting: Family

## 2021-06-16 DIAGNOSIS — M76829 Posterior tibial tendinitis, unspecified leg: Secondary | ICD-10-CM | POA: Diagnosis not present

## 2021-06-16 NOTE — Progress Notes (Signed)
? ?Office Visit Note ?  ?Patient: Brandy [name]           ?Date of Birth: [birth year]           ?MRN: [id number] ?Visit Date: 06/16/2021 ?             ?Requested by: Charlott Rakes, MD ?[address] ?[address] ?[place],  [place] [address] ?PCP: Charlott Rakes, MD ? ?Chief Complaint  ?Patient presents with  ? Left Ankle - Follow-up  ? ? ? ? ?HPI: ?The patient is a 56 year old woman who presents in follow-up for left ankle pain.  We discussed supportive shoewear at her last visit.  Today she is complaining of pain which is much more intense which occurs at least once a week and lasts all day she is pointing to the medial ankle and medial column of her foot she complains of pain with step-off. ? ?Assessment & Plan: ?Visit Diagnoses: No diagnosis found. ? 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  She is tender across the course of the posterior tibial tendon.  She has pain with a single limb heel raise.  Given a posterior tibial tendon brace she voices this provides relief ? ?Follow-Up Instructions: No follow-ups on file.  ? ?Ortho Exam ? ?Patient is alert, oriented, no adenopathy, well-dressed, normal affect, normal respiratory effort. ?On examination of the left lower extremity she does not have any edema no erythema she does have tenderness to the course of the posterior tibial tendon.  Ankle ligaments nontender good range of motion of the ankle. pain with single limb heel raise ? ?Imaging: ?No results found. ?No images are attached to the encounter. ? ?Labs: ?Lab Results  ?Component Value Date  ? HGBA1C 5.70 04/29/2015  ? HGBA1C 5.7 (H) 02/04/2015  ? REPTSTATUS 04/27/2013 FINAL 04/25/2013  ? GRAMSTAIN  11/05/2012  ?  MODERATE WBC PRESENT, PREDOMINANTLY PMN ?NO SQUAMOUS EPITHELIAL CELLS SEEN ?MODERATE GRAM POSITIVE COCCI IN PAIRS ?Performed at [facility]  ? CULT  04/25/2013  ?  No Beta Hemolytic Streptococci Isolated ?Performed at [facility]  ? ? ? ?Lab Results  ?Component Value  Date  ? ALBUMIN 4.6 05/18/2021  ? ALBUMIN 4.8 05/05/2020  ? ALBUMIN 4.3 10/02/2017  ? ? ?No results found for: MG ?No results found for: VD25OH ? ?No results found for: PREALBUMIN ?CBC EXTENDED Latest Ref Rng & Units 10/08/2015 02/05/2015 02/03/2015  ?WBC 4.0 - 10.5 K/uL 5.7 5.6 5.8  ?RBC 3.87 - 5.11 MIL/uL 4.27 4.70 4.86  ?HGB 12.0 - 15.0 g/dL 12.2 13.1 13.7  ?HCT 36.0 - 46.0 % 37.6 40.6 41.7  ?PLT 150 - 400 K/uL 220 217 223  ?NEUTROABS 1.7 - 7.7 K/uL 3.4 - -  ?LYMPHSABS 0.7 - 4.0 K/uL 1.8 - -  ? ? ? ?There is no height or weight on file to calculate BMI. ? ?Orders:  ?No orders of the defined types were placed in this encounter. ? ?No orders of the defined types were placed in this encounter. ? ? ? Procedures: ?No procedures performed ? ?Clinical Data: ?No additional findings. ? ?ROS: ? ?All other systems negative, except as noted in the HPI. ?Review of Systems ? ?Objective: ?Vital Signs: There were no vitals taken for this visit. ? ?Specialty Comments:  ?No specialty comments available. ? ?PMFS History: ?Patient Active Problem List  ? Diagnosis Date Noted  ? Obesity (BMI 35.0-39.9 without comorbidity) 10/24/2016  ? Bipolar disorder ([place]) 04/29/2015  ? Insomnia 03/13/2015  ? Major depression, recurrent (  [place]) 03/10/2015  ? Generalized anxiety disorder 03/10/2015  ? Dizziness and giddiness 02/20/2015  ? GERD (gastroesophageal reflux disease) 02/20/2015  ? Abnormal CXR   ? Dyspnea 02/02/2015  ? Hypoxia 02/02/2015  ? Acute pulmonary edema ([place]) 02/02/2015  ? Hypertensive urgency 02/02/2015  ? Hypertensive crisis   ? Nausea with vomiting 06/09/2011  ? HTN (hypertension) 12/02/2010  ? Diverticulitis of sigmoid colon 12/02/2010  ? Tobacco dependence 12/02/2010  ? ?Past Medical History:  ?Diagnosis Date  ? Allergy   ? Anxiety   ? Depression   ? Diverticulitis   ? Edema   ? Gallstones   ? GERD (gastroesophageal reflux disease)   ? Hyperlipidemia   ? Hypertension   ? Osteoarthritis   ?  ?Family History  ?Problem Relation Age of  Onset  ? Alcohol abuse Mother   ? Hypertension Father   ? Diabetes Sister   ? Schizophrenia Maternal Aunt   ? Schizophrenia Maternal Grandmother   ? Coronary artery disease Paternal Grandmother   ?     young age  ? Diabetes Paternal Grandmother   ? Colon cancer Neg Hx   ? Esophageal cancer Neg Hx   ? Stomach cancer Neg Hx   ? Rectal cancer Neg Hx   ?  ?Past Surgical History:  ?Procedure Laterality Date  ? GALLBLADDER SURGERY    ? ?Social History  ? ?Occupational History  ? Occupation: Chartered certified accountant  ?  Employer: [employer]  ?Tobacco Use  ? Smoking status: Every Day  ?  Packs/day: 0.50  ?  Years: 1.50  ?  Pack years: 0.75  ?  Types: Cigarettes  ? Smokeless tobacco: Never  ?Vaping Use  ? Vaping Use: Never used  ?Substance and Sexual Activity  ? Alcohol use: Yes  ?  Alcohol/week: 2.0 standard drinks  ?  Types: 1 Glasses of wine, 1 Cans of beer per week  ?  Comment: occasionaly  ? Drug use: Yes  ?  Types: Marijuana  ?  Comment: smoked wednesday.  ? Sexual activity: Yes  ?  Partners: Male  ?  Birth control/protection: None  ?  Comment: last period was approximately 6 months per pt.  She repors she could not be pregnant.  ? ? ? ? ? ?

## 2021-06-18 ENCOUNTER — Other Ambulatory Visit (HOSPITAL_COMMUNITY): Payer: Self-pay

## 2021-06-18 ENCOUNTER — Telehealth (HOSPITAL_BASED_OUTPATIENT_CLINIC_OR_DEPARTMENT_OTHER): Payer: PPO | Admitting: Psychiatry

## 2021-06-18 ENCOUNTER — Other Ambulatory Visit: Payer: Self-pay

## 2021-06-18 ENCOUNTER — Encounter (HOSPITAL_COMMUNITY): Payer: Self-pay | Admitting: Psychiatry

## 2021-06-18 ENCOUNTER — Encounter: Payer: Self-pay | Admitting: Family

## 2021-06-18 DIAGNOSIS — F5101 Primary insomnia: Secondary | ICD-10-CM

## 2021-06-18 DIAGNOSIS — F319 Bipolar disorder, unspecified: Secondary | ICD-10-CM | POA: Diagnosis not present

## 2021-06-18 DIAGNOSIS — M76829 Posterior tibial tendinitis, unspecified leg: Secondary | ICD-10-CM | POA: Diagnosis not present

## 2021-06-18 DIAGNOSIS — F419 Anxiety disorder, unspecified: Secondary | ICD-10-CM | POA: Diagnosis not present

## 2021-06-18 MED ORDER — LURASIDONE HCL 120 MG PO TABS
120.0000 mg | ORAL_TABLET | Freq: Every day | ORAL | 2 refills | Status: DC
  Filled 2021-06-18: qty 30, 30d supply, fill #0

## 2021-06-18 MED ORDER — GABAPENTIN 100 MG PO CAPS
100.0000 mg | ORAL_CAPSULE | Freq: Three times a day (TID) | ORAL | 2 refills | Status: DC
  Filled 2021-06-18: qty 90, 30d supply, fill #0

## 2021-06-18 MED ORDER — DULOXETINE HCL 60 MG PO CPEP
60.0000 mg | ORAL_CAPSULE | Freq: Every day | ORAL | 0 refills | Status: DC
  Filled 2021-06-18: qty 90, 90d supply, fill #0

## 2021-06-18 MED ORDER — TRAZODONE HCL 150 MG PO TABS
150.0000 mg | ORAL_TABLET | Freq: Every day | ORAL | 0 refills | Status: DC
  Filled 2021-06-18: qty 90, 90d supply, fill #0

## 2021-06-18 MED ORDER — LAMOTRIGINE 150 MG PO TABS
150.0000 mg | ORAL_TABLET | Freq: Two times a day (BID) | ORAL | 0 refills | Status: DC
  Filled 2021-06-18: qty 180, 90d supply, fill #0

## 2021-06-18 NOTE — Progress Notes (Signed)
Virtual Visit via Telephone Note ? ?I connected with Brandy [name] on 06/18/21 at 10:20 AM EDT by telephone and verified that I am speaking with the correct person using two identifiers. ? ?Location: ?Patient: Home ?Provider: Home office ?  ?I discussed the limitations, risks, security and privacy concerns of performing an evaluation and management service by telephone and the availability of in person appointments. I also discussed with the patient that there may be a patient responsible charge related to this service. The patient expressed understanding and agreed to proceed. ? ? ?History of Present Illness: ?Patient is evaluated by phone session.  On the last visit we started gabapentin and she is taking 2 times a day.  She feels some improvement as not have that much crying spells but is still a lot of financial issues.  She has to pay back social security because it was over paid.  Currently she is on disability since last August.  Her husband who had knee replacement has complication and now need to see Dr. For hematoma.  Patient recently had colonoscopy and blood work.  She is taking clonidine for hot flashes.  Despite taking trazodone she still struggles some time for sleep.  She admitted irritability sometimes mood swings and get frustrated.  Now she promised to see the therapist in person.  She lives with her husband.  She has 2 son and a daughter and 10 grandkids.  Sometimes she feels overwhelmed with the family situation.  She is taking multiple medication for multiple health needs.  She does not want to change the medication but open to try higher dose of gabapentin.  She does not leave the house unless it is important.  We have recommended walking and exercise but she is not consistent with the recommendations. ?   ?Past Psychiatric History: Reviewed. ?H/O inpatient in 1995/05/26 after mother died.  Seen at [facility] from [place],   She saw Dr. Gildardo Griffes and did IOP in 2015/05/26.  No h/o suicidal  attempt.  H/O paranoia, depression and paranoia. Had tried Zoloft, Wellbutrin, Rexulti, Klonopin, Abilify, Lexapro, Risperdal, temazepam, Lunesta and Seroquel. ? ?Recent Results (from the past May 25, 2158 hour(s))  ?CMP14+EGFR     Status: Abnormal  ? Collection Time: 05/18/21  4:12 PM  ?Result Value Ref Range  ? Glucose 137 (H) 70 - 99 mg/dL  ? BUN 13 6 - 24 mg/dL  ? Creatinine, Ser 0.83 0.57 - 1.00 mg/dL  ? eGFR 83 >59 mL/min/1.73  ? BUN/Creatinine Ratio 16 9 - 23  ? Sodium 144 134 - 144 mmol/L  ? Potassium 4.2 3.5 - 5.2 mmol/L  ? Chloride 101 96 - 106 mmol/L  ? CO2 30 (H) 20 - 29 mmol/L  ? Calcium 10.7 (H) 8.7 - 10.2 mg/dL  ? Total Protein 7.8 6.0 - 8.5 g/dL  ? Albumin 4.6 3.8 - 4.9 g/dL  ? Globulin, Total 3.2 1.5 - 4.5 g/dL  ? Albumin/Globulin Ratio 1.4 1.2 - 2.2  ? Bilirubin Total 0.2 0.0 - 1.2 mg/dL  ? Alkaline Phosphatase 84 44 - 121 IU/L  ? AST 13 0 - 40 IU/L  ? ALT 16 0 - 32 IU/L  ?  ? ?Psychiatric Specialty Exam: ?Physical Exam  ?Review of Systems  ?Weight 200 lb (90.7 kg).There is no height or weight on file to calculate BMI.  ?General Appearance: NA  ?Eye Contact:  NA  ?Speech:  Slow  ?Volume:  Decreased  ?Mood:  Dysphoric  ?Affect:  NA  ?Thought Process:  Descriptions of Associations: Intact  ?Orientation:  Full (Time, Place, and Person)  ?Thought Content:  Rumination  ?Suicidal Thoughts:  No  ?Homicidal Thoughts:  No  ?Memory:  Immediate;   Fair ?Recent;   Fair ?Remote;   Fair  ?Judgement:  Fair  ?Insight:  Shallow  ?Psychomotor Activity:  NA  ?Concentration:  Concentration: Fair and Attention Span: Fair  ?Recall:  Fair  ?Fund of Knowledge:  Fair  ?Language:  Fair  ?Akathisia:  No  ?Handed:  Right  ?AIMS (if indicated):     ?Assets:  Communication Skills ?Desire for Improvement ?Housing  ?ADL's:  Intact  ?Cognition:  WNL  ?Sleep:   fair  ? ? ? ? ?Assessment and Plan: ?Bipolar disorder type I.  Anxiety.  Primary insomnia. ? ?I reviewed blood work results.  Patient now agree to see a therapist in person.  We  will provide referral.  Recommend to try gabapentin 100 mg 3 times a day as she feels it helps some of her mood and irritability.  She is reluctant to cut down other medication.  We will continue Latuda 120 mg daily, Lamictal 300 mg daily, trazodone 150 mg at bedtime and Cymbalta 60 mg daily.  Recommended to call us back if she has any question or any concern.  Follow-up in 3 months. ? ?Follow Up Instructions: ? ?  ?I discussed the assessment and treatment plan with the patient. The patient was provided an opportunity to ask questions and all were answered. The patient agreed with the plan and demonstrated an understanding of the instructions. ?  ?The patient was advised to call back or seek an in-person evaluation if the symptoms worsen or if the condition fails to improve as anticipated. ? ?I provided 16 minutes of non-face-to-face time during this encounter. ? ? ?[name], MD  ?

## 2021-06-23 ENCOUNTER — Other Ambulatory Visit (HOSPITAL_COMMUNITY): Payer: Self-pay

## 2021-07-07 ENCOUNTER — Ambulatory Visit: Payer: PPO | Admitting: Family

## 2021-07-09 ENCOUNTER — Ambulatory Visit: Payer: PPO

## 2021-07-14 ENCOUNTER — Encounter: Payer: Self-pay | Admitting: Family

## 2021-07-14 ENCOUNTER — Other Ambulatory Visit (HOSPITAL_COMMUNITY): Payer: Self-pay

## 2021-07-14 ENCOUNTER — Ambulatory Visit (INDEPENDENT_AMBULATORY_CARE_PROVIDER_SITE_OTHER): Payer: PPO | Admitting: Family

## 2021-07-14 DIAGNOSIS — M76829 Posterior tibial tendinitis, unspecified leg: Secondary | ICD-10-CM

## 2021-07-14 DIAGNOSIS — M76822 Posterior tibial tendinitis, left leg: Secondary | ICD-10-CM

## 2021-07-14 DIAGNOSIS — M25572 Pain in left ankle and joints of left foot: Secondary | ICD-10-CM | POA: Diagnosis not present

## 2021-07-14 MED ORDER — NITROGLYCERIN 0.2 MG/HR TD PT24
0.2000 mg | MEDICATED_PATCH | Freq: Every day | TRANSDERMAL | 1 refills | Status: AC
  Filled 2021-07-14: qty 30, 30d supply, fill #0

## 2021-07-14 MED ORDER — PREDNISONE 10 MG PO TABS
10.0000 mg | ORAL_TABLET | Freq: Every day | ORAL | 0 refills | Status: DC
  Filled 2021-07-14: qty 30, 30d supply, fill #0

## 2021-07-14 NOTE — Progress Notes (Signed)
? ?Office Visit Note ?  ?Patient: Brandy [name]           ?Date of Birth: [birth year]           ?MRN: [id number] ?Visit Date: 07/14/2021 ?             ?Requested by: Charlott Rakes, MD ?[address] ?[address] ?[place],  [place] [address] ?PCP: Charlott Rakes, MD ? ?Chief Complaint  ?Patient presents with  ? Left Ankle - Follow-up  ? ? ? ? ?HPI: ?The patient is a 56 year old woman who presents today in follow-up for posterior tibial insufficiency.  She was given a brace at her last office visit which was 1 month ago she states that when she does wear it and provides relief however she has not been wearing it as she spends most of her time at home.  She is in crocs today she continues to have shooting pain along the medial column up and through her ankle pain with step-off.  She has no pain at rest.  She states that the meloxicam has not helped ? ?Assessment & Plan: ?Visit Diagnoses: No diagnosis found. ? ?Plan: Given prednisone for short course as well as nitroglycerin patches to place along the course of the tendon.  Discussed using the tendon or the supportive pads underneath her arch ? ?Follow-Up Instructions: No follow-ups on file.  ? ?Ortho Exam ? ?Patient is alert, oriented, no adenopathy, well-dressed, normal affect, normal respiratory effort. ?On examination of the left lower extremity she does not have edema or erythema.  She does have tenderness along the course of the posterior tibial tendon.  Her ankle ligaments are nontender she does have good range of motion of the foot and ankle.  She has pain with a single limb heel raise ? ?Imaging: ?No results found. ?No images are attached to the encounter. ? ?Labs: ?Lab Results  ?Component Value Date  ? HGBA1C 5.70 04/29/2015  ? HGBA1C 5.7 (H) 02/04/2015  ? REPTSTATUS 04/27/2013 FINAL 04/25/2013  ? GRAMSTAIN  11/05/2012  ?  MODERATE WBC PRESENT, PREDOMINANTLY PMN ?NO SQUAMOUS EPITHELIAL CELLS SEEN ?MODERATE GRAM POSITIVE COCCI IN PAIRS ?Performed at  [facility]  ? CULT  04/25/2013  ?  No Beta Hemolytic Streptococci Isolated ?Performed at [facility]  ? ? ? ?Lab Results  ?Component Value Date  ? ALBUMIN 4.6 05/18/2021  ? ALBUMIN 4.8 05/05/2020  ? ALBUMIN 4.3 10/02/2017  ? ? ?No results found for: MG ?No results found for: VD25OH ? ?No results found for: PREALBUMIN ? ?  Latest Ref Rng & Units 10/08/2015  ?  8:15 AM 02/05/2015  ?  4:00 AM 02/03/2015  ?  4:20 AM  ?CBC EXTENDED  ?WBC 4.0 - 10.5 K/uL 5.7   5.6   5.8    ?RBC 3.87 - 5.11 MIL/uL 4.27   4.70   4.86    ?Hemoglobin 12.0 - 15.0 g/dL 12.2   13.1   13.7    ?HCT 36.0 - 46.0 % 37.6   40.6   41.7    ?Platelets 150 - 400 K/uL 220   217   223    ?NEUT# 1.7 - 7.7 K/uL 3.4      ?Lymph# 0.7 - 4.0 K/uL 1.8      ? ? ? ?There is no height or weight on file to calculate BMI. ? ?Orders:  ?No orders of the defined types were placed in this encounter. ? ?No orders of the defined types were  placed in this encounter. ? ? ? Procedures: ?No procedures performed ? ?Clinical Data: ?No additional findings. ? ?ROS: ? ?All other systems negative, except as noted in the HPI. ?Review of Systems ? ?Objective: ?Vital Signs: There were no vitals taken for this visit. ? ?Specialty Comments:  ?No specialty comments available. ? ?PMFS History: ?Patient Active Problem List  ? Diagnosis Date Noted  ? Obesity (BMI 35.0-39.9 without comorbidity) 10/24/2016  ? Bipolar disorder ([place]) 04/29/2015  ? Insomnia 03/13/2015  ? Major depression, recurrent ([place]) 03/10/2015  ? Generalized anxiety disorder 03/10/2015  ? Dizziness and giddiness 02/20/2015  ? GERD (gastroesophageal reflux disease) 02/20/2015  ? Abnormal CXR   ? Dyspnea 02/02/2015  ? Hypoxia 02/02/2015  ? Acute pulmonary edema ([place]) 02/02/2015  ? Hypertensive urgency 02/02/2015  ? Hypertensive crisis   ? Nausea with vomiting 06/09/2011  ? HTN (hypertension) 12/02/2010  ? Diverticulitis of sigmoid colon 12/02/2010  ? Tobacco dependence 12/02/2010  ? ?Past Medical History:   ?Diagnosis Date  ? Allergy   ? Anxiety   ? Depression   ? Diverticulitis   ? Edema   ? Gallstones   ? GERD (gastroesophageal reflux disease)   ? Hyperlipidemia   ? Hypertension   ? Osteoarthritis   ?  ?Family History  ?Problem Relation Age of Onset  ? Alcohol abuse Mother   ? Hypertension Father   ? Diabetes Sister   ? Schizophrenia Maternal Aunt   ? Schizophrenia Maternal Grandmother   ? Coronary artery disease Paternal Grandmother   ?     young age  ? Diabetes Paternal Grandmother   ? Colon cancer Neg Hx   ? Esophageal cancer Neg Hx   ? Stomach cancer Neg Hx   ? Rectal cancer Neg Hx   ?  ?Past Surgical History:  ?Procedure Laterality Date  ? GALLBLADDER SURGERY    ? ?Social History  ? ?Occupational History  ? Occupation: Chartered certified accountant  ?  Employer: [employer]  ?Tobacco Use  ? Smoking status: Every Day  ?  Packs/day: 0.50  ?  Years: 1.50  ?  Pack years: 0.75  ?  Types: Cigarettes  ? Smokeless tobacco: Never  ?Vaping Use  ? Vaping Use: Never used  ?Substance and Sexual Activity  ? Alcohol use: Yes  ?  Alcohol/week: 2.0 standard drinks  ?  Types: 1 Glasses of wine, 1 Cans of beer per week  ?  Comment: occasionaly  ? Drug use: Yes  ?  Types: Marijuana  ?  Comment: smoked wednesday.  ? Sexual activity: Yes  ?  Partners: Male  ?  Birth control/protection: None  ?  Comment: last period was approximately 6 months per pt.  She repors she could not be pregnant.  ? ? ? ? ? ?

## 2021-07-16 ENCOUNTER — Ambulatory Visit
Admission: RE | Admit: 2021-07-16 | Discharge: 2021-07-16 | Disposition: A | Discharge status: Home / Self Care | Payer: PPO | Source: Ambulatory Visit | Attending: Family Medicine | Admitting: Family Medicine

## 2021-07-16 DIAGNOSIS — Z1231 Encounter for screening mammogram for malignant neoplasm of breast: Secondary | ICD-10-CM

## 2021-08-04 ENCOUNTER — Ambulatory Visit (HOSPITAL_COMMUNITY): Payer: PPO | Admitting: Clinical

## 2021-09-07 ENCOUNTER — Ambulatory Visit: Payer: Self-pay | Admitting: Family Medicine

## 2021-09-09 ENCOUNTER — Other Ambulatory Visit (HOSPITAL_COMMUNITY): Payer: Self-pay

## 2021-09-20 ENCOUNTER — Other Ambulatory Visit (HOSPITAL_COMMUNITY): Payer: Self-pay

## 2021-09-20 ENCOUNTER — Telehealth (HOSPITAL_BASED_OUTPATIENT_CLINIC_OR_DEPARTMENT_OTHER): Payer: PPO | Admitting: Psychiatry

## 2021-09-20 ENCOUNTER — Encounter (HOSPITAL_COMMUNITY): Payer: Self-pay | Admitting: Psychiatry

## 2021-09-20 DIAGNOSIS — F419 Anxiety disorder, unspecified: Secondary | ICD-10-CM

## 2021-09-20 DIAGNOSIS — F5101 Primary insomnia: Secondary | ICD-10-CM

## 2021-09-20 DIAGNOSIS — F319 Bipolar disorder, unspecified: Secondary | ICD-10-CM

## 2021-09-20 MED ORDER — GABAPENTIN 100 MG PO CAPS
100.0000 mg | ORAL_CAPSULE | Freq: Three times a day (TID) | ORAL | 0 refills | Status: DC
  Filled 2021-09-20: qty 90, 30d supply, fill #0

## 2021-09-20 MED ORDER — LURASIDONE HCL 120 MG PO TABS
120.0000 mg | ORAL_TABLET | Freq: Every day | ORAL | 0 refills | Status: DC
  Filled 2021-09-20: qty 30, 30d supply, fill #0

## 2021-09-20 MED ORDER — DULOXETINE HCL 60 MG PO CPEP
60.0000 mg | ORAL_CAPSULE | Freq: Every day | ORAL | 0 refills | Status: DC
  Filled 2021-09-20: qty 90, 90d supply, fill #0

## 2021-09-20 MED ORDER — TRAZODONE HCL 150 MG PO TABS
150.0000 mg | ORAL_TABLET | Freq: Every day | ORAL | 0 refills | Status: DC
  Filled 2021-09-20: qty 90, 90d supply, fill #0

## 2021-09-20 NOTE — Progress Notes (Signed)
Virtual Visit via Telephone Note  I connected with Brandy [name] on 09/20/21 at 10:20 AM EDT by telephone and verified that I am speaking with the correct person using two identifiers.  Location: Patient: Home Provider: Home Office   I discussed the limitations, risks, security and privacy concerns of performing an evaluation and management service by telephone and the availability of in person appointments. I also discussed with the patient that there may be a patient responsible charge related to this service. The patient expressed understanding and agreed to proceed.   History of Present Illness: Patient is evaluated by phone session.  She reported increased anxiety, depression, irritability and mood swings.  She is having a lot of crying spells and sometimes passive suicidal thoughts but no plan or any intent.  She is very concerned about her general health has noticed having throat pain, nausea and her chronic pain is worsened.  She is not sure what causing these symptoms but like to come off from Lamictal.  She has been taking Lamictal for a while but she feels it is causing the side effects.  She also started Mobic for her pain recently but convince that Lamictal causing the side effects.  She is on disability and living her husband who has chronic health issues including CHF.  She worried about her finances as Social Security getting the money every month which she owed to  them.  She endorsed paranoia, feeling of hopelessness.  She gets easily frustrated.  She has not able to see a therapist and no one had called her back to schedule appointment.  We have started gabapentin but she admitted does not take the medicine as prescribed and tend to get forget.  She also endorsed that she is taking too many medication and it is causing a lot of financial strain.  Her sleep is fair.  She admitted highs and lows.  She denies drinking or using any illegal substances.  Her appetite is okay.  Energy  level is fair.  She denies any weight changes or gain or loss.    Past Psychiatric History: Reviewed. H/O inpatient in 06-19-95 after mother died.  Seen at [facility] from [place],   She saw Dr. Gildardo Griffes and did IOP in Jun 19, 2015.  No h/o suicidal attempt.  H/O paranoia, depression and paranoia. Had tried Zoloft, Wellbutrin, Rexulti, Klonopin, Abilify, Lexapro, Risperdal, temazepam, Lunesta and Seroquel.  Psychiatric Specialty Exam: Physical Exam  Review of Systems  Constitutional:        Throat pain  Gastrointestinal:  Positive for nausea.    Weight 200 lb (90.7 kg).There is no height or weight on file to calculate BMI.  General Appearance: NA  Eye Contact:  NA  Speech:  Slow  Volume:  Decreased  Mood:  Anxious, Depressed, and Irritable  Affect:  NA  Thought Process:  Descriptions of Associations: Intact  Orientation:  Full (Time, Place, and Person)  Thought Content:  Paranoid Ideation and Rumination  Suicidal Thoughts:  No  Homicidal Thoughts:  No  Memory:  Immediate;   Fair Recent;   Fair Remote;   Fair  Judgement:  Fair  Insight:  Shallow  Psychomotor Activity:  Decreased  Concentration:  Concentration: Fair and Attention Span: Fair  Recall:  [employer] of Knowledge:  Fair  Language:  Fair  Akathisia:  No  Handed:  Right  AIMS (if indicated):     Assets:  Communication Skills Desire for Improvement Housing  ADL's:  Intact  Cognition:  WNL  Sleep:   fair      Assessment and Plan: Bipolar disorder type I.  Anxiety.  Primary insomnia.  Patient having concern about side effects of medication and financial strain.  She is not taking the gabapentin as prescribed.  She like to come off from the Lamictal which she believes causing nausea, throat pain.  It is unclear because she has been taking Lamictal for a while.  Recently Mobic was added but she does not feel or believe that it is causing any side effects.  However we will discontinue Lamictal as patient also taking Latuda to  help her mood along with trazodone and Cymbalta.  I also recommend if her nausea and throat pain did not get better then she should call her PCP to address the symptoms.  I encouraged to take the gabapentin as prescribed to help her anxiety.  Discussed safety concerns at any time having active suicidal thoughts or homicidal thought then she need to call 911 or go to local emergency room.  We will follow up in 2 weeks.  We will provide names of the therapist and I encouraged that she should reach out to them.  For now continue Latuda 120 mg daily for 30 days, trazodone 150 mg at bedtime and Cymbalta 60 mg for 90-day supply and gabapentin 100 mg 3 times a day.  Follow-up in 2 to 3 weeks.  Recommended to call us back if she has any question or any concern.  Follow Up Instructions:    I discussed the assessment and treatment plan with the patient. The patient was provided an opportunity to ask questions and all were answered. The patient agreed with the plan and demonstrated an understanding of the instructions.   The patient was advised to call back or seek an in-person evaluation if the symptoms worsen or if the condition fails to improve as anticipated.  Collaboration of Care: Primary Care Provider AEB recommended to call her PCP to address her chronic symptoms.  Patient/Guardian was advised Release of Information must be obtained prior to any record release in order to collaborate their care with an outside provider. Patient/Guardian was advised if they have not already done so to contact the registration department to sign all necessary forms in order for [name] to release information regarding their care.   Consent: Patient/Guardian gives verbal consent for treatment and assignment of benefits for services provided during this visit. Patient/Guardian expressed understanding and agreed to proceed.    I provided 30 minutes of non-face-to-face time during this encounter.   [name], MD

## 2021-10-07 ENCOUNTER — Telehealth (HOSPITAL_BASED_OUTPATIENT_CLINIC_OR_DEPARTMENT_OTHER): Payer: PPO | Admitting: Psychiatry

## 2021-10-07 ENCOUNTER — Encounter (HOSPITAL_COMMUNITY): Payer: Self-pay | Admitting: Psychiatry

## 2021-10-07 ENCOUNTER — Other Ambulatory Visit (HOSPITAL_COMMUNITY): Payer: Self-pay

## 2021-10-07 VITALS — Wt 200.0 lb

## 2021-10-07 DIAGNOSIS — F5101 Primary insomnia: Secondary | ICD-10-CM | POA: Diagnosis not present

## 2021-10-07 DIAGNOSIS — F319 Bipolar disorder, unspecified: Secondary | ICD-10-CM

## 2021-10-07 DIAGNOSIS — F419 Anxiety disorder, unspecified: Secondary | ICD-10-CM

## 2021-10-07 MED ORDER — LURASIDONE HCL 120 MG PO TABS
120.0000 mg | ORAL_TABLET | Freq: Every day | ORAL | 0 refills | Status: DC
  Filled 2021-10-07: qty 90, 90d supply, fill #0

## 2021-10-07 MED ORDER — GABAPENTIN 100 MG PO CAPS
100.0000 mg | ORAL_CAPSULE | Freq: Four times a day (QID) | ORAL | 0 refills | Status: DC
  Filled 2021-10-07: qty 120, 30d supply, fill #0

## 2021-10-07 NOTE — Progress Notes (Signed)
Virtual Visit via Telephone Note  I connected with Brandy [name] on 10/07/21 at  3:00 PM EDT by telephone and verified that I am speaking with the correct person using two identifiers.  Location: Patient: Home Provider: Home office   I discussed the limitations, risks, security and privacy concerns of performing an evaluation and management service by telephone and the availability of in person appointments. I also discussed with the patient that there may be a patient responsible charge related to this service. The patient expressed understanding and agreed to proceed.   History of Present Illness: Patient is evaluated by phone session.  On the last visit we stopped the Lamictal because she was having throat pain and nausea.  We resume her gabapentin which she has been out.  She noticed improvement in her mood.  She is still feel sad depressed but not irritable, angry or having any suicidal thoughts.  She is on disability and lives with her husband who has multiple health issues.  We have recommended to see a therapist but so far patient has not established care and waiting to get appointment.  Since taking the gabapentin every day she noticed improvement in her anxiety and chronic pain.  She still have highs and lows but manageable.   Past Psychiatric History: Reviewed. H/O inpatient in 07-03-1995 after mother died.  Seen at [facility] from [place],   She saw Dr. Gildardo Griffes and did IOP in 03-Jul-2015.  No h/o suicidal attempt.  H/O paranoia, depression and paranoia. Had tried Zoloft, Wellbutrin, Rexulti, Klonopin, Abilify, Lexapro, Risperdal, temazepam, Lunesta and Seroquel.  Psychiatric Specialty Exam: Physical Exam  Review of Systems  There were no vitals taken for this visit.There is no height or weight on file to calculate BMI.  General Appearance: NA  Eye Contact:  NA  Speech:  Slow  Volume:  Decreased  Mood:  Depressed and Irritable  Affect:  NA  Thought Process:  Descriptions of  Associations: Circumstantial  Orientation:  Full (Time, Place, and Person)  Thought Content:  Paranoid Ideation and Rumination  Suicidal Thoughts:  No  Homicidal Thoughts:  No  Memory:  Immediate;   Fair Recent;   Fair Remote;   Fair  Judgement:  Fair  Insight:  Shallow  Psychomotor Activity:  NA  Concentration:  Concentration: Fair and Attention Span: Fair  Recall:  [employer] of Knowledge:  Fair  Language:  Fair  Akathisia:  No  Handed:  Right  AIMS (if indicated):     Assets:  Communication Skills Desire for Improvement Housing  ADL's:  Intact  Cognition:  WNL  Sleep:   better      Assessment and Plan: Bipolar disorder type I.  Anxiety.  Primary insomnia.  Discussed current stressors which is chronic and usually financial.  Patient doing better since we stopped the Lamictal as no more throat pain and nausea.  She also liked the gabapentin.  Encouraged to see a therapist and we will also refer her to see a therapist in our office.  Continue Latuda 120 mg daily, trazodone 150 mg at bedtime, Cymbalta 60 mg daily, recommend to increase gabapentin 100 mg 4 times a day.  We will follow up in 6 weeks.  Follow Up Instructions:    I discussed the assessment and treatment plan with the patient. The patient was provided an opportunity to ask questions and all were answered. The patient agreed with the plan and demonstrated an understanding of the instructions.   The patient was  advised to call back or seek an in-person evaluation if the symptoms worsen or if the condition fails to improve as anticipated.  I provided 18 minutes of non-face-to-face time during this encounter.   [name], MD

## 2021-10-08 ENCOUNTER — Other Ambulatory Visit (HOSPITAL_COMMUNITY): Payer: Self-pay

## 2021-10-20 ENCOUNTER — Other Ambulatory Visit (HOSPITAL_COMMUNITY): Payer: Self-pay

## 2021-11-18 ENCOUNTER — Telehealth (HOSPITAL_BASED_OUTPATIENT_CLINIC_OR_DEPARTMENT_OTHER): Payer: PPO | Admitting: Psychiatry

## 2021-11-18 ENCOUNTER — Encounter (HOSPITAL_COMMUNITY): Payer: Self-pay | Admitting: Psychiatry

## 2021-11-18 ENCOUNTER — Other Ambulatory Visit (HOSPITAL_COMMUNITY): Payer: Self-pay

## 2021-11-18 VITALS — Wt 200.0 lb

## 2021-11-18 DIAGNOSIS — F319 Bipolar disorder, unspecified: Secondary | ICD-10-CM

## 2021-11-18 DIAGNOSIS — F419 Anxiety disorder, unspecified: Secondary | ICD-10-CM

## 2021-11-18 DIAGNOSIS — F5101 Primary insomnia: Secondary | ICD-10-CM

## 2021-11-18 MED ORDER — DULOXETINE HCL 60 MG PO CPEP
60.0000 mg | ORAL_CAPSULE | Freq: Every day | ORAL | 0 refills | Status: DC
  Filled 2021-11-18: qty 90, 90d supply, fill #0

## 2021-11-18 MED ORDER — HALOPERIDOL 5 MG PO TABS
5.0000 mg | ORAL_TABLET | Freq: Every day | ORAL | 1 refills | Status: DC
  Filled 2021-11-18: qty 30, 30d supply, fill #0

## 2021-11-18 MED ORDER — GABAPENTIN 100 MG PO CAPS
100.0000 mg | ORAL_CAPSULE | Freq: Four times a day (QID) | ORAL | 0 refills | Status: DC
  Filled 2021-11-18: qty 120, 30d supply, fill #0

## 2021-11-18 MED ORDER — TRAZODONE HCL 150 MG PO TABS
150.0000 mg | ORAL_TABLET | Freq: Every day | ORAL | 0 refills | Status: DC
  Filled 2021-11-18: qty 90, 90d supply, fill #0

## 2021-11-18 NOTE — Progress Notes (Signed)
Virtual Visit via Telephone Note  I connected with Brandy [name] on 11/18/21 at  4:20 PM EDT by telephone and verified that I am speaking with the correct person using two identifiers.  Location: Patient: Home Provider: Office   I discussed the limitations, risks, security and privacy concerns of performing an evaluation and management service by telephone and the availability of in person appointments. I also discussed with the patient that there may be a patient responsible charge related to this service. The patient expressed understanding and agreed to proceed.   History of Present Illness: Patient is evaluated by phone session.  She reported financial strain and cannot afford her medication.  She is not sure which medicine is expensive but lately her co-pays are very high.  She had applied for Medicaid that did not go through.  She is also not eligible for food stamps.  She is on disability and lives with her husband who has multiple health issues.  She endorsed some time ruminative thoughts, getting tired, field fatigue.  Now she had appointment to see a therapist in our office next week.  I encouraged to keep that appointment.  She still had irritability, highs and lows, mood swings.  She has chronic paranoia and sometimes hallucinations.  Denies any suicidal thoughts.  Energy level is fair.  She tends to forget things and that frustrate her a lot.  She feels very anxious about her finances at some of the time she could not afford her medication.  She would like to try a different medication because she believed there to die is very expensive and she may not able to keep it.  She denies any tremors, shakes or any EPS.  She has appointment for her physical and blood work in September.  She denies drinking or using any illegal substances.   Past Psychiatric History: Reviewed. H/O inpatient in 25-Jun-1995 after mother died.  Seen at [facility] from [place],   She saw Dr. Gildardo Griffes and did IOP in  06/25/15.  No h/o suicidal attempt.  H/O paranoia, depression and paranoia. Had tried Zoloft, Wellbutrin, Rexulti, Klonopin, Abilify, Lexapro, Risperdal, temazepam, Lunesta and Seroquel.  Psychiatric Specialty Exam: Physical Exam  Review of Systems  Weight 200 lb (90.7 kg).There is no height or weight on file to calculate BMI.  General Appearance: NA  Eye Contact:  NA  Speech:  Slow  Volume:  Decreased  Mood:  Dysphoric and tired  Affect:  NA  Thought Process:  Descriptions of Associations: Intact  Orientation:  Full (Time, Place, and Person)  Thought Content:  Rumination  Suicidal Thoughts:  No  Homicidal Thoughts:  No  Memory:  Immediate;   Fair Recent;   Fair Remote;   Fair  Judgement:  Fair  Insight:  Shallow  Psychomotor Activity:  NA  Concentration:  Concentration: Fair and Attention Span: Fair  Recall:  [employer] of Knowledge:  Fair  Language:  Fair  Akathisia:  No  Handed:  Right  AIMS (if indicated):     Assets:  Communication Skills Desire for Improvement Housing  ADL's:  Intact  Cognition:  WNL  Sleep:   fair      Assessment and Plan: Bipolar disorder type I.  Anxiety.  Primary insomnia.  Discussed psychosocial stressors which is chronic and usually financial's.  He has Medicare but not able to afford her medication.  She plans to try a different medication.  I reviewed previous medication trial.  She had never tried Haldol.  She liked something that she can afford.  We will try Haldol 5 mg at bedtime, continue Cymbalta 60 mg daily, trazodone 150 mg at bedtime and gabapentin 100 mg 4 times a day.  She noted his gabapentin helps her a lot and does not want to change or discontinue.  I strongly encouraged to keep the appointment with her therapist next week.  Once again information given about her appointment next week with time, date and reminded appointment is in person so she need to come in the office.  I also encouraged to call us back if she has any issues of  concern from the Haldol.  We talked about regular walking and breathing technique to calm herself.  She will discontinue Latuda.  Recommended to call us back if she is any question or any concern.  Follow-up in 4 weeks.  Follow Up Instructions:    I discussed the assessment and treatment plan with the patient. The patient was provided an opportunity to ask questions and all were answered. The patient agreed with the plan and demonstrated an understanding of the instructions.   The patient was advised to call back or seek an in-person evaluation if the symptoms worsen or if the condition fails to improve as anticipated.  Collaboration of Care: Primary Care Provider AEB notes are available in epic to review.  Patient/Guardian was advised Release of Information must be obtained prior to any record release in order to collaborate their care with an outside provider. Patient/Guardian was advised if they have not already done so to contact the registration department to sign all necessary forms in order for [name] to release information regarding their care.   Consent: Patient/Guardian gives verbal consent for treatment and assignment of benefits for services provided during this visit. Patient/Guardian expressed understanding and agreed to proceed.    I provided 20 minutes of non-face-to-face time during this encounter.   [name], MD

## 2021-11-22 ENCOUNTER — Other Ambulatory Visit (HOSPITAL_COMMUNITY): Payer: Self-pay

## 2021-11-24 ENCOUNTER — Ambulatory Visit (HOSPITAL_COMMUNITY): Payer: PPO | Admitting: Licensed Clinical Social Worker

## 2021-12-17 ENCOUNTER — Ambulatory Visit
Admission: EM | Admit: 2021-12-17 | Discharge: 2021-12-17 | Disposition: A | Discharge status: Home / Self Care | Payer: PPO | Attending: Physician Assistant | Admitting: Physician Assistant

## 2021-12-17 DIAGNOSIS — J069 Acute upper respiratory infection, unspecified: Secondary | ICD-10-CM | POA: Diagnosis not present

## 2021-12-17 DIAGNOSIS — Z1152 Encounter for screening for COVID-19: Secondary | ICD-10-CM | POA: Insufficient documentation

## 2021-12-17 LAB — RESP PANEL BY RT-PCR (FLU A&B, COVID) ARPGX2
Influenza A by PCR: NEGATIVE
Influenza B by PCR: NEGATIVE
SARS Coronavirus 2 by RT PCR: NEGATIVE

## 2021-12-17 NOTE — ED Triage Notes (Signed)
Pt presents to [facility] with co of ha, fatigue, generalized body aches and pains, cough, congestion, sore throat for 3 days. Pt reports no otc medications for symptoms.

## 2021-12-17 NOTE — ED Provider Notes (Signed)
[facility]    CSN: [id number] Arrival date & time: 12/17/21  0802      History   Chief Complaint Chief Complaint  Patient presents with   Fatigue   Sore Throat   Cough   Headache    HPI Brandy [name] is a 56 y.o. female.   Patient here today for evaluation of cough, congestion, sore throat, fatigue and headache that is been ongoing for the last 3 days.  She has not had fever.  She has tried over-the-counter medication without significant relief.  She has not had any nausea, vomiting or diarrhea.  The history is provided by the patient.    Past Medical History:  Diagnosis Date   Allergy    Anxiety    Depression    Diverticulitis    Edema    Gallstones    GERD (gastroesophageal reflux disease)    Hyperlipidemia    Hypertension    Osteoarthritis     Patient Active Problem List   Diagnosis Date Noted   Obesity (BMI 35.0-39.9 without comorbidity) 10/24/2016   Bipolar disorder ([place]) 04/29/2015   Insomnia 03/13/2015   Major depression, recurrent ([place]) 03/10/2015   Generalized anxiety disorder 03/10/2015   Dizziness and giddiness 02/20/2015   GERD (gastroesophageal reflux disease) 02/20/2015   Abnormal CXR    Dyspnea 02/02/2015   Hypoxia 02/02/2015   Acute pulmonary edema ([place]) 02/02/2015   Hypertensive urgency 02/02/2015   Hypertensive crisis    Nausea with vomiting 06/09/2011   HTN (hypertension) 12/02/2010   Diverticulitis of sigmoid colon 12/02/2010   Tobacco dependence 12/02/2010    Past Surgical History:  Procedure Laterality Date   GALLBLADDER SURGERY      OB History   No obstetric history on file.      Home Medications    Prior to Admission medications   Medication Sig Start Date End Date Taking? Authorizing Provider  albuterol (VENTOLIN HFA) 108 (90 Base) MCG/ACT inhaler Inhale 2 puffs into the lungs every 4 (four) hours as needed for wheezing or shortness of breath. 09/16/19   Hall-Potvin, [place], PA-C  amLODipine  (NORVASC) 5 MG tablet Take 1 tablet (5 mg total) by mouth daily. 05/18/21   Charlott Rakes, MD  aspirin EC 81 MG EC tablet Take 1 tablet (81 mg total) by mouth daily. 02/05/15   Domenic Polite, MD  carvedilol (COREG) 25 MG tablet Take 1 tablet (25 mg total) by mouth 2 (two) times daily with a meal. 05/18/21   Charlott Rakes, MD  cetirizine (ZYRTEC) 10 MG tablet Take 1 tablet (10 mg total) by mouth daily. 09/16/19   Hall-Potvin, [place], PA-C  cloNIDine (CATAPRES) 0.1 MG tablet Take 1 tablet (0.1 mg total) by mouth at bedtime as needed for hot flashes. 05/18/21   Charlott Rakes, MD  DULoxetine (CYMBALTA) 60 MG capsule Take 1 capsule (60 mg total) by mouth daily. 11/18/21   Arfeen, Arlyce Harman, MD  fluticasone (FLONASE) 50 MCG/ACT nasal spray Place 1 spray into both nostrils daily. 09/16/19   Hall-Potvin, [place], PA-C  furosemide (LASIX) 20 MG tablet Take 1 tablet (20 mg total) by mouth daily. 05/18/21   Charlott Rakes, MD  gabapentin (NEURONTIN) 100 MG capsule Take 1 capsule (100 mg total) by mouth 4 (four) times daily. 11/18/21   Arfeen, Arlyce Harman, MD  haloperidol (HALDOL) 5 MG tablet Take 1 tablet (5 mg total) by mouth at bedtime. 11/18/21 11/18/22  Arfeen, Arlyce Harman, MD  lamoTRIgine (LAMICTAL) 150 MG tablet Take 1 tablet (150  mg total) by mouth 2 (two) times daily. Patient not taking: Reported on 09/20/2021 06/18/21   Arfeen, Arlyce Harman, MD  losartan-hydrochlorothiazide (HYZAAR) 100-25 MG tablet Take 1 tablet by mouth daily. 05/18/21   Charlott Rakes, MD  Lurasidone HCl 120 MG TABS Take 1 tablet (120 mg total) by mouth daily with breakfast. Patient not taking: Reported on 11/18/2021 10/07/21   Arfeen, Arlyce Harman, MD  meloxicam (MOBIC) 7.5 MG tablet Take 1 tablet (7.5 mg total) by mouth daily. 05/18/21   Charlott Rakes, MD  nitroGLYCERIN (NITRODUR - DOSED IN MG/24 HR) 0.2 mg/hr patch Place 1 patch (0.2 mg total) onto the skin daily. Apply patch near the affected area and change location daily. 07/14/21   Suzan Slick, NP   pantoprazole (PROTONIX) 40 MG tablet Take 1 tablet (40 mg total) by mouth daily. 05/18/21   Charlott Rakes, MD  potassium chloride SA (KLOR-CON M) 20 MEQ tablet Take 1 tablet (20 mEq total) by mouth daily. 05/18/21   Charlott Rakes, MD  predniSONE (DELTASONE) 10 MG tablet Take 1 tablet (10 mg total) by mouth daily. 07/14/21   Suzan Slick, NP  traZODone (DESYREL) 150 MG tablet Take 1 tablet (150 mg total) by mouth at bedtime. 11/18/21   Arfeen, Arlyce Harman, MD  losartan (COZAAR) 100 MG tablet Take 1 tablet (100 mg total) by mouth daily. 03/16/20 05/05/20  Charlott Rakes, MD    Family History Family History  Problem Relation Age of Onset   Alcohol abuse Mother    Hypertension Father    Diabetes Sister    Schizophrenia Maternal Aunt    Breast cancer Paternal Aunt        13s   Schizophrenia Maternal Grandmother    Coronary artery disease Paternal Grandmother        young age   Diabetes Paternal Grandmother    Colon cancer Neg Hx    Esophageal cancer Neg Hx    Stomach cancer Neg Hx    Rectal cancer Neg Hx     Social History Social History   Tobacco Use   Smoking status: Every Day    Packs/day: 0.50    Years: 10.00    Total pack years: 5.00    Types: Cigarettes   Smokeless tobacco: Never  Vaping Use   Vaping Use: Never used  Substance Use Topics   Alcohol use: Yes    Alcohol/week: 3.0 standard drinks of alcohol    Types: 1 Glasses of wine, 2 Cans of beer per week   Drug use: Yes    Types: Marijuana     Allergies   Azithromycin, Contrast media [iodinated contrast media], and Shellfish allergy   Review of Systems Review of Systems  Constitutional:  Positive for fatigue. Negative for chills and fever.  HENT:  Positive for congestion and sore throat. Negative for ear pain.   Eyes:  Negative for discharge and redness.  Respiratory:  Positive for cough. Negative for shortness of breath and wheezing.   Gastrointestinal:  Negative for diarrhea, nausea and vomiting.   Neurological:  Positive for headaches.     Physical Exam Triage Vital Signs ED Triage Vitals [12/17/21 0813]  Enc Vitals Group     BP (!) 143/89     Pulse Rate 86     Resp 19     Temp 97.9 F (36.6 C)     Temp Source Oral     SpO2 96 %     Weight      Height  Head Circumference      Peak Flow      Pain Score      Pain Loc      Pain Edu?      Excl. in [place]?    No data found.  Updated Vital Signs BP (!) 143/89   Pulse 86   Temp 97.9 F (36.6 C) (Oral)   Resp 19   LMP 11/02/2020   SpO2 96%   Visual Acuity Right Eye Distance:   Left Eye Distance:   Bilateral Distance:    Right Eye Near:   Left Eye Near:    Bilateral Near:     Physical Exam Vitals and nursing note reviewed.  Constitutional:      General: She is not in acute distress.    Appearance: Normal appearance. She is not ill-appearing.  HENT:     Head: Normocephalic and atraumatic.     Nose: Congestion present.     Mouth/Throat:     Mouth: Mucous membranes are moist.     Pharynx: No oropharyngeal exudate or posterior oropharyngeal erythema.  Eyes:     Conjunctiva/sclera: Conjunctivae normal.  Cardiovascular:     Rate and Rhythm: Normal rate and regular rhythm.     Heart sounds: Normal heart sounds. No murmur heard. Pulmonary:     Effort: Pulmonary effort is normal. No respiratory distress.     Breath sounds: Normal breath sounds. No wheezing, rhonchi or rales.  Skin:    General: Skin is warm and dry.  Neurological:     Mental Status: She is alert.  Psychiatric:        Mood and Affect: Mood normal.        Thought Content: Thought content normal.      UC Treatments / Results  Labs (all labs ordered are listed, but only abnormal results are displayed) Labs Reviewed  RESP PANEL BY RT-PCR (FLU A&B, COVID) [id number]    EKG   Radiology No results found.  Procedures Procedures (including critical care time)  Medications Ordered in UC Medications - No data to display  Initial  Impression / Assessment and Plan / UC Course  I have reviewed the triage vital signs and the nursing notes.  Pertinent labs & imaging results that were available during my care of the patient were reviewed by me and considered in my medical decision making (see chart for details).    Suspect viral etiology of symptoms.  Will screen for COVID and flu.  Recommended symptomatic treatment, increase fluid and rest while awaiting results and follow-up with any further concerns.  Final Clinical Impressions(s) / UC Diagnoses   Final diagnoses:  Acute upper respiratory infection  Encounter for screening for COVID-19   Discharge Instructions   None    ED Prescriptions   None    PDMP not reviewed this encounter.   Francene Finders, PA-C 12/17/21 1042

## 2021-12-22 ENCOUNTER — Encounter: Payer: Self-pay | Admitting: Family Medicine

## 2021-12-22 ENCOUNTER — Other Ambulatory Visit (HOSPITAL_COMMUNITY): Payer: Self-pay

## 2021-12-22 ENCOUNTER — Ambulatory Visit: Payer: PPO | Attending: Family Medicine | Admitting: Family Medicine

## 2021-12-22 VITALS — BP 114/77 | HR 92 | Temp 98.3°F | Ht 61.0 in | Wt 197.6 lb

## 2021-12-22 DIAGNOSIS — F3162 Bipolar disorder, current episode mixed, moderate: Secondary | ICD-10-CM | POA: Diagnosis not present

## 2021-12-22 DIAGNOSIS — K219 Gastro-esophageal reflux disease without esophagitis: Secondary | ICD-10-CM

## 2021-12-22 DIAGNOSIS — N951 Menopausal and female climacteric states: Secondary | ICD-10-CM

## 2021-12-22 DIAGNOSIS — E1169 Type 2 diabetes mellitus with other specified complication: Secondary | ICD-10-CM

## 2021-12-22 DIAGNOSIS — E876 Hypokalemia: Secondary | ICD-10-CM | POA: Diagnosis not present

## 2021-12-22 DIAGNOSIS — R519 Headache, unspecified: Secondary | ICD-10-CM

## 2021-12-22 DIAGNOSIS — J328 Other chronic sinusitis: Secondary | ICD-10-CM

## 2021-12-22 DIAGNOSIS — Z23 Encounter for immunization: Secondary | ICD-10-CM | POA: Diagnosis not present

## 2021-12-22 DIAGNOSIS — E1159 Type 2 diabetes mellitus with other circulatory complications: Secondary | ICD-10-CM | POA: Diagnosis not present

## 2021-12-22 DIAGNOSIS — I152 Hypertension secondary to endocrine disorders: Secondary | ICD-10-CM | POA: Diagnosis not present

## 2021-12-22 DIAGNOSIS — E119 Type 2 diabetes mellitus without complications: Secondary | ICD-10-CM | POA: Insufficient documentation

## 2021-12-22 LAB — POCT GLYCOSYLATED HEMOGLOBIN (HGB A1C): HbA1c, POC (controlled diabetic range): 8.6 % — AB (ref 0.0–7.0)

## 2021-12-22 MED ORDER — FLUTICASONE PROPIONATE 50 MCG/ACT NA SUSP
1.0000 | Freq: Every day | NASAL | 1 refills | Status: DC
  Filled 2021-12-22: qty 16, 60d supply, fill #0

## 2021-12-22 MED ORDER — PANTOPRAZOLE SODIUM 40 MG PO TBEC
40.0000 mg | DELAYED_RELEASE_TABLET | Freq: Every day | ORAL | 1 refills | Status: DC
  Filled 2021-12-22 – 2022-03-09 (×2): qty 90, 90d supply, fill #0
  Filled 2022-06-16: qty 90, 90d supply, fill #1

## 2021-12-22 MED ORDER — AMLODIPINE BESYLATE 5 MG PO TABS
5.0000 mg | ORAL_TABLET | Freq: Every day | ORAL | 1 refills | Status: DC
  Filled 2021-12-22: qty 90, 90d supply, fill #0
  Filled 2022-04-01: qty 90, 90d supply, fill #1

## 2021-12-22 MED ORDER — LOSARTAN POTASSIUM-HCTZ 100-25 MG PO TABS
1.0000 | ORAL_TABLET | Freq: Every day | ORAL | 1 refills | Status: DC
  Filled 2021-12-22: qty 90, 90d supply, fill #0
  Filled 2022-04-01: qty 90, 90d supply, fill #1

## 2021-12-22 MED ORDER — CARVEDILOL 25 MG PO TABS
25.0000 mg | ORAL_TABLET | Freq: Two times a day (BID) | ORAL | 1 refills | Status: DC
  Filled 2021-12-22: qty 180, 90d supply, fill #0
  Filled 2022-06-30: qty 180, 90d supply, fill #1

## 2021-12-22 MED ORDER — CLONIDINE HCL 0.1 MG PO TABS
0.1000 mg | ORAL_TABLET | Freq: Every evening | ORAL | 1 refills | Status: DC | PRN
  Filled 2021-12-22 – 2022-03-09 (×2): qty 90, 90d supply, fill #0
  Filled 2022-06-16: qty 90, 90d supply, fill #1

## 2021-12-22 MED ORDER — POTASSIUM CHLORIDE CRYS ER 20 MEQ PO TBCR
20.0000 meq | EXTENDED_RELEASE_TABLET | Freq: Every day | ORAL | 1 refills | Status: DC
  Filled 2021-12-22: qty 90, 90d supply, fill #0
  Filled 2022-12-19: qty 90, 90d supply, fill #1

## 2021-12-22 MED ORDER — OZEMPIC (0.25 OR 0.5 MG/DOSE) 2 MG/3ML ~~LOC~~ SOPN
0.2500 mg | PEN_INJECTOR | SUBCUTANEOUS | 1 refills | Status: DC
  Filled 2021-12-22 – 2022-01-03 (×3): qty 3, 56d supply, fill #0

## 2021-12-22 NOTE — Progress Notes (Unsigned)
Headaches for 1 week.

## 2021-12-22 NOTE — Patient Instructions (Signed)
Semaglutide Injection What is this medication? SEMAGLUTIDE (SEM a GLOO tide) treats type 2 diabetes. It works by increasing insulin levels in your body, which decreases your blood sugar (glucose). It also reduces the amount of sugar released into the blood and slows down your digestion. It can also be used to lower the risk of heart attack and stroke in people with type 2 diabetes. Changes to diet and exercise are often combined with this medication. This medicine may be used for other purposes; ask your health care provider or pharmacist if you have questions. COMMON BRAND NAME(S): OZEMPIC What should I tell my care team before I take this medication? They need to know if you have any of these conditions: Endocrine tumors (MEN 2) or if someone in your family had these tumors Eye disease, vision problems History of pancreatitis Kidney disease Stomach problems Thyroid cancer or if someone in your family had thyroid cancer An unusual or allergic reaction to semaglutide, other medications, foods, dyes, or preservatives Pregnant or trying to get pregnant Breast-feeding How should I use this medication? This medication is for injection under the skin of your upper leg (thigh), stomach area, or upper arm. It is given once every week (every 7 days). You will be taught how to prepare and give this medication. Use exactly as directed. Take your medication at regular intervals. Do not take it more often than directed. If you use this medication with insulin, you should inject this medication and the insulin separately. Do not mix them together. Do not give the injections right next to each other. Change (rotate) injection sites with each injection. It is important that you put your used needles and syringes in a special sharps container. Do not put them in a trash can. If you do not have a sharps container, call your pharmacist or care team to get one. A special MedGuide will be given to you by the  pharmacist with each prescription and refill. Be sure to read this information carefully each time. This medication comes with INSTRUCTIONS FOR USE. Ask your pharmacist for directions on how to use this medication. Read the information carefully. Talk to your pharmacist or care team if you have questions. Talk to your care team about the use of this medication in children. Special care may be needed. Overdosage: If you think you have taken too much of this medicine contact a poison control center or emergency room at once. NOTE: This medicine is only for you. Do not share this medicine with others. What if I miss a dose? If you miss a dose, take it as soon as you can within 5 days after the missed dose. Then take your next dose at your regular weekly time. If it has been longer than 5 days after the missed dose, do not take the missed dose. Take the next dose at your regular time. Do not take double or extra doses. If you have questions about a missed dose, contact your care team for advice. What may interact with this medication? Other medications for diabetes Many medications may cause changes in blood sugar, these include: Alcohol containing beverages Antiviral medications for HIV or AIDS Aspirin and aspirin-like medications Certain medications for blood pressure, heart disease, irregular heart beat Chromium Diuretics Female hormones, such as estrogens or progestins, birth control pills Fenofibrate Gemfibrozil Isoniazid Lanreotide Female hormones or anabolic steroids MAOIs like Carbex, Eldepryl, Marplan, Nardil, and Parnate Medications for weight loss Medications for allergies, asthma, cold, or cough Medications for depression,   anxiety, or psychotic disturbances Niacin Nicotine NSAIDs, medications for pain and inflammation, like ibuprofen or naproxen Octreotide Pasireotide Pentamidine Phenytoin Probenecid Quinolone antibiotics such as ciprofloxacin, levofloxacin, ofloxacin Some  herbal dietary supplements Steroid medications such as prednisone or cortisone Sulfamethoxazole; trimethoprim Thyroid hormones Some medications can hide the warning symptoms of low blood sugar (hypoglycemia). You may need to monitor your blood sugar more closely if you are taking one of these medications. These include: Beta-blockers, often used for high blood pressure or heart problems (examples include atenolol, metoprolol, propranolol) Clonidine Guanethidine Reserpine This list may not describe all possible interactions. Give your health care provider a list of all the medicines, herbs, non-prescription drugs, or dietary supplements you use. Also tell them if you smoke, drink alcohol, or use illegal drugs. Some items may interact with your medicine. What should I watch for while using this medication? Visit your care team for regular checks on your progress. Drink plenty of fluids while taking this medication. Check with your care team if you get an attack of severe diarrhea, nausea, and vomiting. The loss of too much body fluid can make it dangerous for you to take this medication. A test called the HbA1C (A1C) will be monitored. This is a simple blood test. It measures your blood sugar control over the last 2 to 3 months. You will receive this test every 3 to 6 months. Learn how to check your blood sugar. Learn the symptoms of low and high blood sugar and how to manage them. Always carry a quick-source of sugar with you in case you have symptoms of low blood sugar. Examples include hard sugar candy or glucose tablets. Make sure others know that you can choke if you eat or drink when you develop serious symptoms of low blood sugar, such as seizures or unconsciousness. They must get medical help at once. Tell your care team if you have high blood sugar. You might need to change the dose of your medication. If you are sick or exercising more than usual, you might need to change the dose of your  medication. Do not skip meals. Ask your care team if you should avoid alcohol. Many nonprescription cough and cold products contain sugar or alcohol. These can affect blood sugar. Pens should never be shared. Even if the needle is changed, sharing may result in passing of viruses like hepatitis or HIV. Wear a medical ID bracelet or chain, and carry a card that describes your disease and details of your medication and dosage times. Do not become pregnant while taking this medication. Women should inform their care team if they wish to become pregnant or think they might be pregnant. There is a potential for serious side effects to an unborn child. Talk to your care team for more information. What side effects may I notice from receiving this medication? Side effects that you should report to your care team as soon as possible: Allergic reactions--skin rash, itching, hives, swelling of the face, lips, tongue, or throat Change in vision Dehydration--increased thirst, dry mouth, feeling faint or lightheaded, headache, dark yellow or brown urine Gallbladder problems--severe stomach pain, nausea, vomiting, fever Heart palpitations--rapid, pounding, or irregular heartbeat Kidney injury--decrease in the amount of urine, swelling of the ankles, hands, or feet Pancreatitis--severe stomach pain that spreads to your back or gets worse after eating or when touched, fever, nausea, vomiting Thyroid cancer--new mass or lump in the neck, pain or trouble swallowing, trouble breathing, hoarseness Side effects that usually do not require medical   attention (report to your care team if they continue or are bothersome): Diarrhea Loss of appetite Nausea Stomach pain Vomiting This list may not describe all possible side effects. Call your doctor for medical advice about side effects. You may report side effects to FDA at 1-800-FDA-1088. Where should I keep my medication? Keep out of the reach of children. Store  unopened pens in a refrigerator between 2 and 8 degrees C (36 and 46 degrees F). Do not freeze. Protect from light and heat. After you first use the pen, it can be stored for 56 days at room temperature between 15 and 30 degrees C (59 and 86 degrees F) or in a refrigerator. Throw away your used pen after 56 days or after the expiration date, whichever comes first. Do not store your pen with the needle attached. If the needle is left on, medication may leak from the pen. NOTE: This sheet is a summary. It may not cover all possible information. If you have questions about this medicine, talk to your doctor, pharmacist, or health care provider.  2023 Elsevier/Gold Standard (2020-06-04 00:00:00)  

## 2021-12-22 NOTE — Progress Notes (Unsigned)
Subjective:  Patient ID: Brandy [name], female    DOB: 06/01/1965  Age: 56 y.o. MRN: [id number]  CC: Hypertension   HPI Brandy [name] is a 55 y.o. year old female with a history of hypertension, bipolar disorder, vertigo, GERD, history of pulmonary edema, type 2 diabetes mellitus (A1c 8.6) who comes into the clinic for a follow-up visit    Interval History:  She has had frontal headaches for the last week and she feels light headed with it as well. Flu and COVID tests were negative at UC she was diagnosed with URI. Walking back and forth causes her to be dyspneic. Headache feels 'light'.  Denies presence of upper respiratory symptoms at the moment.  She feels she has also gained some weight.  Denies presence of fever. She wakes up from sleep after 3 hours and tosses and turns and does not get a good nights rest.  Her depression is not controlled and she is under the care of psychiatry and has been undergoing therapy but will be having a new therapist assigned to her soon.  She lacks motivation and does not get out of her room. She feels safe in her room and does not go out. "You can't eat food these days as everything is poisoned and it comes from the store poisoned". Last psych visit was 1 month ago and Haldol initiated at that time with a follow-up recommended in 4 weeks.  Prolonged standing to do dishes causes her lower back to hurt.  Endorses adherence with her antihypertensive. A1c performed in the clinic returns with a new diagnosis of diabetes with an A1c of 8.6. Past Medical History:  Diagnosis Date   Allergy    Anxiety    Depression    Diverticulitis    Edema    Gallstones    GERD (gastroesophageal reflux disease)    Hyperlipidemia    Hypertension    Osteoarthritis     Past Surgical History:  Procedure Laterality Date   GALLBLADDER SURGERY      Family History  Problem Relation Age of Onset   Alcohol abuse Mother    Hypertension Father    Diabetes  Sister    Schizophrenia Maternal Aunt    Breast cancer Paternal Aunt        79s   Schizophrenia Maternal Grandmother    Coronary artery disease Paternal Grandmother        young age   Diabetes Paternal Grandmother    Colon cancer Neg Hx    Esophageal cancer Neg Hx    Stomach cancer Neg Hx    Rectal cancer Neg Hx     Social History   Socioeconomic History   Marital status: Married    Spouse name: Not on file   Number of children: 3   Years of education: Not on file   Highest education level: Not on file  Occupational History   Occupation: Research scientist (life sciences): [employer]  Tobacco Use   Smoking status: Every Day    Packs/day: 0.50    Years: 10.00    Total pack years: 5.00    Types: Cigarettes   Smokeless tobacco: Never  Vaping Use   Vaping Use: Never used  Substance and Sexual Activity   Alcohol use: Yes    Alcohol/week: 3.0 standard drinks of alcohol    Types: 1 Glasses of wine, 2 Cans of beer per week   Drug use: Yes    Types: Marijuana   Sexual  activity: Yes    Partners: Male    Birth control/protection: None  Other Topics Concern   Not on file  Social History Narrative   Not on file   Social Determinants of Health   Financial Resource Strain: Not on file  Food Insecurity: Not on file  Transportation Needs: Not on file  Physical Activity: Not on file  Stress: Not on file  Social Connections: Not on file    Allergies  Allergen Reactions   Azithromycin Other (See Comments)    "Stomach cramps"   Contrast Media [Iodinated Contrast Media] Hives   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"    Outpatient Medications Prior to Visit  Medication Sig Dispense Refill   aspirin EC 81 MG EC tablet Take 1 tablet (81 mg total) by mouth daily.     cetirizine (ZYRTEC) 10 MG tablet Take 1 tablet (10 mg total) by mouth daily. 30 tablet 1   furosemide (LASIX) 20 MG tablet Take 1 tablet (20 mg total) by mouth daily. 90 tablet 1    gabapentin (NEURONTIN) 100 MG capsule Take 1 capsule (100 mg total) by mouth 4 (four) times daily. 120 capsule 0   haloperidol (HALDOL) 5 MG tablet Take 1 tablet (5 mg total) by mouth at bedtime. 30 tablet 1   lamoTRIgine (LAMICTAL) 150 MG tablet Take 1 tablet (150 mg total) by mouth 2 (two) times daily. 180 tablet 0   Lurasidone HCl 120 MG TABS Take 1 tablet (120 mg total) by mouth daily with breakfast. 90 tablet 0   meloxicam (MOBIC) 7.5 MG tablet Take 1 tablet (7.5 mg total) by mouth daily. 30 tablet 1   nitroGLYCERIN (NITRODUR - DOSED IN MG/24 HR) 0.2 mg/hr patch Place 1 patch (0.2 mg total) onto the skin daily. Apply patch near the affected area and change location daily. 30 patch 1   predniSONE (DELTASONE) 10 MG tablet Take 1 tablet (10 mg total) by mouth daily. 30 tablet 0   traZODone (DESYREL) 150 MG tablet Take 1 tablet (150 mg total) by mouth at bedtime. 90 tablet 0   amLODipine (NORVASC) 5 MG tablet Take 1 tablet (5 mg total) by mouth daily. 90 tablet 1   carvedilol (COREG) 25 MG tablet Take 1 tablet (25 mg total) by mouth 2 (two) times daily with a meal. 180 tablet 1   cloNIDine (CATAPRES) 0.1 MG tablet Take 1 tablet (0.1 mg total) by mouth at bedtime as needed for hot flashes. 90 tablet 1   fluticasone (FLONASE) 50 MCG/ACT nasal spray Place 1 spray into both nostrils daily. 16 g 1   losartan-hydrochlorothiazide (HYZAAR) 100-25 MG tablet Take 1 tablet by mouth daily. 90 tablet 1   pantoprazole (PROTONIX) 40 MG tablet Take 1 tablet (40 mg total) by mouth daily. 90 tablet 1   potassium chloride SA (KLOR-CON M) 20 MEQ tablet Take 1 tablet (20 mEq total) by mouth daily. 90 tablet 1   albuterol (VENTOLIN HFA) 108 (90 Base) MCG/ACT inhaler Inhale 2 puffs into the lungs every 4 (four) hours as needed for wheezing or shortness of breath. (Patient not taking: Reported on 12/22/2021) 18 g 0   DULoxetine (CYMBALTA) 60 MG capsule Take 1 capsule (60 mg total) by mouth daily. (Patient not taking:  Reported on 12/22/2021) 90 capsule 0   No facility-administered medications prior to visit.     ROS Review of Systems  Constitutional:  Negative for activity change and appetite change.  HENT:  Negative for sinus pressure and sore throat.   Respiratory:  Negative for chest tightness, shortness of breath and wheezing.   Cardiovascular:  Negative for chest pain and palpitations.  Gastrointestinal:  Negative for abdominal distention, abdominal pain and constipation.  Genitourinary: Negative.   Musculoskeletal: Negative.   Neurological:  Positive for headaches.  Psychiatric/Behavioral:  Positive for dysphoric mood. Negative for behavioral problems.     Objective:  BP 114/77   Pulse 92   Temp 98.3 F (36.8 C) (Oral)   Ht '5\' 1"'$  (1.549 m)   Wt 197 lb 9.6 oz (89.6 kg)   LMP 11/02/2020   SpO2 96%   BMI 37.34 kg/m      12/22/2021    3:30 PM 12/17/2021    8:13 AM 11/18/2021   11:19 AM  BP/Weight  Systolic BP 400 867   Diastolic BP 77 89   Wt. (Lbs) 197.6    BMI 37.34 kg/m2       Information is confidential and restricted. Go to Review Flowsheets to unlock data.    Wt Readings from Last 3 Encounters:  12/22/21 197 lb 9.6 oz (89.6 kg)  05/28/21 200 lb (90.7 kg)  05/18/21 198 lb (89.8 kg)     Physical Exam Constitutional:      Appearance: She is well-developed.  Cardiovascular:     Rate and Rhythm: Normal rate.     Heart sounds: Normal heart sounds. No murmur heard. Pulmonary:     Effort: Pulmonary effort is normal.     Breath sounds: Normal breath sounds. No wheezing or rales.  Chest:     Chest wall: No tenderness.  Abdominal:     General: Bowel sounds are normal. There is no distension.     Palpations: Abdomen is soft. There is no mass.     Tenderness: There is no abdominal tenderness.  Musculoskeletal:        General: Normal range of motion.     Right lower leg: No edema.     Left lower leg: No edema.  Neurological:     Mental Status: She is alert and oriented  to person, place, and time.  Psychiatric:        Mood and Affect: Mood is depressed.        Behavior: Behavior is withdrawn.        Thought Content: Thought content is paranoid and delusional.        Latest Ref Rng & Units 12/22/2021    4:30 PM 05/18/2021    4:12 PM 05/05/2020   10:53 AM  CMP  Glucose 70 - 99 mg/dL 90  137  122   BUN 6 - 24 mg/dL '19  13  14   '$ Creatinine 0.57 - 1.00 mg/dL 0.82  0.83  1.13   Sodium 134 - 144 mmol/L 142  144  143   Potassium 3.5 - 5.2 mmol/L 4.0  4.2  4.2   Chloride 96 - 106 mmol/L 102  101  102   CO2 20 - 29 mmol/L '26  30  25   '$ Calcium 8.7 - 10.2 mg/dL 9.8  10.7  10.1   Total Protein 6.0 - 8.5 g/dL  7.8  7.8   Total Bilirubin 0.0 - 1.2 mg/dL  0.2  0.2   Alkaline Phos 44 - 121 IU/L  84  94   AST 0 - 40 IU/L  13  12   ALT 0 - 32 IU/L  16  15     Lipid Panel  Component Value Date/Time   CHOL 193 05/05/2020 1053   TRIG 215 (H) 05/05/2020 1053   HDL 49 05/05/2020 1053   CHOLHDL 3.9 05/05/2020 1053   CHOLHDL 3.1 02/03/2015 0420   VLDL 24 02/03/2015 0420   LDLCALC 107 (H) 05/05/2020 1053    CBC    Component Value Date/Time   WBC 5.7 10/08/2015 0815   RBC 4.27 10/08/2015 0815   HGB 12.2 10/08/2015 0815   HCT 37.6 10/08/2015 0815   PLT 220 10/08/2015 0815   MCV 88.1 10/08/2015 0815   MCH 28.6 10/08/2015 0815   MCHC 32.4 10/08/2015 0815   RDW 15.1 10/08/2015 0815   LYMPHSABS 1.8 10/08/2015 0815   MONOABS 0.2 10/08/2015 0815   EOSABS 0.2 10/08/2015 0815   BASOSABS 0.0 10/08/2015 0815    Lab Results  Component Value Date   HGBA1C 8.6 (A) 12/22/2021    Assessment & Plan:  1. Hypertension associated with diabetes ([place]) Controlled Continue current regimen Counseled on blood pressure goal of less than 130/80, low-sodium, DASH diet, medication compliance, 150 minutes of moderate intensity exercise per week. Discussed medication compliance, adverse effects. - carvedilol (COREG) 25 MG tablet; Take 1 tablet (25 mg total) by mouth 2  (two) times daily with a meal.  Dispense: 180 tablet; Refill: 1 - amLODipine (NORVASC) 5 MG tablet; Take 1 tablet (5 mg total) by mouth daily.  Dispense: 90 tablet; Refill: 1 - losartan-hydrochlorothiazide (HYZAAR) 100-25 MG tablet; Take 1 tablet by mouth daily.  Dispense: 90 tablet; Refill: 1  2. Other chronic sinusitis Could explain headaches as she has been out of Flonase which I have refilled - fluticasone (FLONASE) 50 MCG/ACT nasal spray; Place 1 spray into both nostrils daily.  Dispense: 16 g; Refill: 1  3. Vasomotor symptoms due to menopause Controlled - cloNIDine (CATAPRES) 0.1 MG tablet; Take 1 tablet (0.1 mg total) by mouth at bedtime as needed for hot flashes.  Dispense: 90 tablet; Refill: 1  4. Gastroesophageal reflux disease without esophagitis Stable - pantoprazole (PROTONIX) 40 MG tablet; Take 1 tablet (40 mg total) by mouth daily.  Dispense: 90 tablet; Refill: 1  5. Nonintractable headache, unspecified chronicity pattern, unspecified headache type Uncontrolled Possible sinus etiology Refill nasal steroid and antihistamine Underlying insomnia could be contributing as well as underlying mental health Sleep hygiene is imperative Also on sedatives from her psychiatrist  6. Type 2 diabetes mellitus with other specified complication, without long-term current use of insulin (HCC) New diagnosis with A1c of 8.6 Previous history of gestational diabetes We will commence GLP-1 RA after shared decision making we will titrate this upward to achieve maximal weight loss benefit Counseled on Diabetic diet, my plate method, 683 minutes of moderate intensity exercise/week Blood sugar logs with fasting goals of 80-120 mg/dl, random of less than 180 and in the event of sugars less than 60 mg/dl or greater than 400 mg/dl encouraged to notify the clinic. Advised on the need for annual eye exams, annual foot exams, Pneumonia vaccine. - POCT glycosylated hemoglobin (Hb A1C) - Semaglutide,0.25  or 0.'5MG'$ /DOS, (OZEMPIC, 0.25 OR 0.5 MG/DOSE,) 2 MG/3ML SOPN; Inject 0.25 mg into the skin once a week.  Dispense: 3 mL; Refill: 1 - Basic Metabolic Panel - Continuous Blood Gluc Sensor (FREESTYLE LIBRE 3 SENSOR) MISC; 1 each by Does not apply route every 14 (fourteen) days. Place 1 sensor on the skin every 14 days. Use to check glucose continuously  Dispense: 2 each; Refill: 11  7. Need for immunization against influenza - Flu  Vaccine QUAD 16moIM (Fluarix, Fluzone & Alfiuria Quad PF)  8. Hypokalemia Diuretic-induced - potassium chloride SA (KLOR-CON M) 20 MEQ tablet; Take 1 tablet (20 mEq total) by mouth daily.  Dispense: 90 tablet; Refill: 1  9.  Bipolar disorder Uncontrolled With associated paranoia and delusions Currently under psych care I will have the LCSW reach out to her as this is significantly impacting her  Health Care Maintenance: Due for Pap smear, will have this at her next visit Meds ordered this encounter  Medications   carvedilol (COREG) 25 MG tablet    Sig: Take 1 tablet (25 mg total) by mouth 2 (two) times daily with a meal.    Dispense:  180 tablet    Refill:  1   amLODipine (NORVASC) 5 MG tablet    Sig: Take 1 tablet (5 mg total) by mouth daily.    Dispense:  90 tablet    Refill:  1   fluticasone (FLONASE) 50 MCG/ACT nasal spray    Sig: Place 1 spray into both nostrils daily.    Dispense:  16 g    Refill:  1   cloNIDine (CATAPRES) 0.1 MG tablet    Sig: Take 1 tablet (0.1 mg total) by mouth at bedtime as needed for hot flashes.    Dispense:  90 tablet    Refill:  1   losartan-hydrochlorothiazide (HYZAAR) 100-25 MG tablet    Sig: Take 1 tablet by mouth daily.    Dispense:  90 tablet    Refill:  1   pantoprazole (PROTONIX) 40 MG tablet    Sig: Take 1 tablet (40 mg total) by mouth daily.    Dispense:  90 tablet    Refill:  1   potassium chloride SA (KLOR-CON M) 20 MEQ tablet    Sig: Take 1 tablet (20 mEq total) by mouth daily.    Dispense:  90 tablet     Refill:  1   Semaglutide,0.25 or 0.'5MG'$ /DOS, (OZEMPIC, 0.25 OR 0.5 MG/DOSE,) 2 MG/3ML SOPN    Sig: Inject 0.25 mg into the skin once a week.    Dispense:  3 mL    Refill:  1    Follow-up: Return in about 1 month (around 01/21/2022) for Pap smear, Diabetes follow-up.     Visit required 46 minutes of patient care including median intraservice time, reviewing previous notes and test results, coordination of care, counseling the patient and new diagnosis of diabetes mellitus in addition to management of chronic medical conditions.Time also spent ordering medications, investigations and documenting in the chart.  All questions were answered to the patient's satisfaction   ECharlott Rakes MD, FAAFP. CW[facility]trand WOak [facility] [place]  12/23/2021, 8:31 AM

## 2021-12-23 ENCOUNTER — Encounter: Payer: Self-pay | Admitting: Family Medicine

## 2021-12-23 ENCOUNTER — Telehealth: Payer: Self-pay | Admitting: Family Medicine

## 2021-12-23 ENCOUNTER — Other Ambulatory Visit (HOSPITAL_COMMUNITY): Payer: Self-pay

## 2021-12-23 LAB — BASIC METABOLIC PANEL
BUN/Creatinine Ratio: 23 (ref 9–23)
BUN: 19 mg/dL (ref 6–24)
CO2: 26 mmol/L (ref 20–29)
Calcium: 9.8 mg/dL (ref 8.7–10.2)
Chloride: 102 mmol/L (ref 96–106)
Creatinine, Ser: 0.82 mg/dL (ref 0.57–1.00)
Glucose: 90 mg/dL (ref 70–99)
Potassium: 4 mmol/L (ref 3.5–5.2)
Sodium: 142 mmol/L (ref 134–144)
eGFR: 84 mL/min/{1.73_m2} (ref >59)

## 2021-12-23 MED ORDER — FREESTYLE LIBRE 3 SENSOR MISC
1.0000 | 11 refills | Status: AC
  Filled 2021-12-23: qty 2, 28d supply, fill #0
  Filled 2022-01-07 – 2022-01-19 (×2): qty 2, 28d supply, fill #1
  Filled 2022-03-09: qty 2, 28d supply, fill #2
  Filled 2022-04-01: qty 2, 28d supply, fill #3
  Filled 2022-09-14: qty 2, 28d supply, fill #4
  Filled 2022-12-19: qty 2, 28d supply, fill #5

## 2021-12-23 NOTE — Telephone Encounter (Signed)
She suffers from severe depression and is currently under the care of psychiatry and will be having a new therapist.  Please contact her to ensure she is following up appropriately as her symptoms do not seem to be improving.  She does lack motivation.  Thank you

## 2021-12-28 ENCOUNTER — Telehealth: Payer: Self-pay | Admitting: Family Medicine

## 2021-12-28 ENCOUNTER — Ambulatory Visit: Payer: Self-pay | Admitting: *Deleted

## 2021-12-28 NOTE — Telephone Encounter (Signed)
Pt called to report that the pharmacy needs follow up from PCP prior to prescribing her Ozempic. She also is missing a pain medication for her back that was discussed during latest visit.   [facility]  [address] [place] [address]  Phone: [phone] Fax: [phone]

## 2021-12-28 NOTE — Telephone Encounter (Signed)
  Chief Complaint: Still waiting for prior authorization of Ozempic.   Nothing was called in for her back pain after Dr. Margarita Rana told her last Wed. During visit she would call in something. Symptoms: Checking up on the status of her Ozempic and medication for back pain. Frequency: N/A Pertinent Negatives: Patient denies N/A Disposition: '[]'$ ED /'[]'$ Urgent Care (no appt availability in office) / '[]'$ Appointment(In office/virtual)/ '[]'$  [facility]/ '[]'$ Home Care/ '[]'$ Refused Recommended Disposition /'[]'$ [facility]/ '[x]'$  Follow-up with PCP Additional Notes: Message sent to Dr. Margarita Rana regarding status of these 2 medications.

## 2021-12-28 NOTE — Telephone Encounter (Signed)
Reason for Disposition  [1] Caller has URGENT medicine question about med that PCP or specialist prescribed AND [2] triager unable to answer question  Answer Assessment - Initial Assessment Questions 1. NAME of MEDICINE: "What medicine(s) are you calling about?"     Ozempic needs prior authorization.   She is still waiting for this.  Medicine for back pain was not called in.  She saw Dr. Margarita Rana last Wed. And she was supposed to call in something for her back pain but the pharmacy said nothing was called int. 2. QUESTION: "What is your question?" (e.g., double dose of medicine, side effect)     Checking to see status of the prior authorization on the Ozempic and also the medicine needed for back pain wasn't called in. 3. PRESCRIBER: "Who prescribed the medicine?" Reason: if prescribed by specialist, call should be referred to that group.     Dr. Charlott Rakes 4. SYMPTOMS: "Do you have any symptoms?" If Yes, ask: "What symptoms are you having?"  "How bad are the symptoms (e.g., mild, moderate, severe)     N/A 5. PREGNANCY:  "Is there any chance that you are pregnant?" "When was your last menstrual period?"     N/A  Protocols used: Medication Question Call-A-AH

## 2021-12-29 ENCOUNTER — Telehealth: Payer: Self-pay

## 2021-12-29 ENCOUNTER — Telehealth (HOSPITAL_BASED_OUTPATIENT_CLINIC_OR_DEPARTMENT_OTHER): Payer: PPO | Admitting: Psychiatry

## 2021-12-29 ENCOUNTER — Other Ambulatory Visit: Payer: Self-pay

## 2021-12-29 ENCOUNTER — Encounter (HOSPITAL_COMMUNITY): Payer: Self-pay | Admitting: Psychiatry

## 2021-12-29 ENCOUNTER — Other Ambulatory Visit (HOSPITAL_COMMUNITY): Payer: Self-pay

## 2021-12-29 DIAGNOSIS — F5101 Primary insomnia: Secondary | ICD-10-CM | POA: Diagnosis not present

## 2021-12-29 DIAGNOSIS — F319 Bipolar disorder, unspecified: Secondary | ICD-10-CM

## 2021-12-29 DIAGNOSIS — F419 Anxiety disorder, unspecified: Secondary | ICD-10-CM

## 2021-12-29 MED ORDER — TRAZODONE HCL 150 MG PO TABS
150.0000 mg | ORAL_TABLET | Freq: Every day | ORAL | 0 refills | Status: DC
  Filled 2021-12-29: qty 90, 90d supply, fill #0

## 2021-12-29 MED ORDER — DULOXETINE HCL 60 MG PO CPEP
60.0000 mg | ORAL_CAPSULE | Freq: Every day | ORAL | 0 refills | Status: DC
  Filled 2021-12-29: qty 90, 90d supply, fill #0

## 2021-12-29 MED ORDER — HALOPERIDOL 5 MG PO TABS
5.0000 mg | ORAL_TABLET | Freq: Every day | ORAL | 2 refills | Status: DC
  Filled 2021-12-29: qty 30, 30d supply, fill #0

## 2021-12-29 MED ORDER — GABAPENTIN 100 MG PO CAPS
100.0000 mg | ORAL_CAPSULE | Freq: Four times a day (QID) | ORAL | 2 refills | Status: DC
  Filled 2021-12-29: qty 120, 30d supply, fill #0

## 2021-12-29 NOTE — Telephone Encounter (Signed)
Submitted PA for Ozempic-ins asked about Metformin history-no metformin history, expect PA to be denied but waiting on insurance decision.
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
## 2021-12-30 ENCOUNTER — Other Ambulatory Visit (HOSPITAL_COMMUNITY): Payer: Self-pay

## 2021-12-31 ENCOUNTER — Other Ambulatory Visit: Payer: Self-pay

## 2021-12-31 NOTE — Telephone Encounter (Signed)
PA for Ozempic has been denied, medical criteria not met.

## 2021-12-31 NOTE — Telephone Encounter (Signed)
Can I just place her on Metformin then we resend the PA for Ozempic or does she have to have failed Metformin? Thanks

## 2022-01-03 ENCOUNTER — Other Ambulatory Visit: Payer: Self-pay

## 2022-01-03 ENCOUNTER — Other Ambulatory Visit (HOSPITAL_COMMUNITY): Payer: Self-pay

## 2022-01-03 MED ORDER — METFORMIN HCL 500 MG PO TABS
500.0000 mg | ORAL_TABLET | Freq: Two times a day (BID) | ORAL | 3 refills | Status: DC
  Filled 2022-01-03: qty 60, 30d supply, fill #0
  Filled 2022-02-04: qty 60, 30d supply, fill #1
  Filled 2022-03-09: qty 60, 30d supply, fill #2
  Filled 2022-04-13: qty 60, 30d supply, fill #3

## 2022-01-03 NOTE — Addendum Note (Signed)
Addended by: Charlott Rakes on: 01/03/2022 08:15 AM   Modules accepted: Orders

## 2022-01-03 NOTE — Telephone Encounter (Signed)
I have sent a prescription for metformin to the pharmacy.  Can you please update the patient about this whole process so she is not wondering whether metformin came from?  She would also need to let me know if she has any adverse effects with metformin.  Thank you.

## 2022-01-06 ENCOUNTER — Encounter (HOSPITAL_COMMUNITY): Payer: Self-pay | Admitting: Licensed Clinical Social Worker

## 2022-01-06 ENCOUNTER — Ambulatory Visit (INDEPENDENT_AMBULATORY_CARE_PROVIDER_SITE_OTHER): Payer: PPO | Admitting: Licensed Clinical Social Worker

## 2022-01-06 DIAGNOSIS — F319 Bipolar disorder, unspecified: Secondary | ICD-10-CM

## 2022-01-06 DIAGNOSIS — F411 Generalized anxiety disorder: Secondary | ICD-10-CM | POA: Diagnosis not present

## 2022-01-07 ENCOUNTER — Other Ambulatory Visit (HOSPITAL_COMMUNITY): Payer: Self-pay

## 2022-01-10 ENCOUNTER — Encounter (HOSPITAL_COMMUNITY): Payer: Self-pay
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
## 2022-01-19 ENCOUNTER — Other Ambulatory Visit (HOSPITAL_COMMUNITY): Payer: Self-pay

## 2022-01-26 NOTE — Telephone Encounter (Signed)
LCSWA called patient today to introduce herself and to assess patients' mental health needs. Patient did not answer the phone. LCSWA was able to leave a brief message with the patient asking them to return the call. Patient was referred by PCP for a follow up to see if she has been seeing her outside therapist and providing her with motivation and support.

## 2022-02-04 ENCOUNTER — Other Ambulatory Visit (HOSPITAL_COMMUNITY): Payer: Self-pay

## 2022-02-14 ENCOUNTER — Ambulatory Visit
Admission: EM | Admit: 2022-02-14 | Discharge: 2022-02-14 | Disposition: A | Discharge status: Home / Self Care | Payer: PPO | Attending: Urgent Care | Admitting: Urgent Care

## 2022-02-14 DIAGNOSIS — Z79899 Other long term (current) drug therapy: Secondary | ICD-10-CM | POA: Diagnosis not present

## 2022-02-14 DIAGNOSIS — R197 Diarrhea, unspecified: Secondary | ICD-10-CM | POA: Insufficient documentation

## 2022-02-14 DIAGNOSIS — R059 Cough, unspecified: Secondary | ICD-10-CM | POA: Diagnosis not present

## 2022-02-14 DIAGNOSIS — Z20822 Contact with and (suspected) exposure to covid-19: Secondary | ICD-10-CM | POA: Insufficient documentation

## 2022-02-14 DIAGNOSIS — Z1152 Encounter for screening for COVID-19: Secondary | ICD-10-CM | POA: Diagnosis not present

## 2022-02-14 DIAGNOSIS — R6889 Other general symptoms and signs: Secondary | ICD-10-CM | POA: Diagnosis not present

## 2022-02-14 LAB — RESP PANEL BY RT-PCR (RSV, FLU A&B, COVID)  RVPGX2
Influenza A by PCR: NEGATIVE
Influenza B by PCR: NEGATIVE
Resp Syncytial Virus by PCR: NEGATIVE
SARS Coronavirus 2 by RT PCR: NEGATIVE

## 2022-02-14 MED ORDER — BENZONATATE 100 MG PO CAPS: 100.0000 mg | ORAL_CAPSULE | Freq: Two times a day (BID) | ORAL | 0 refills | Status: DC | PRN

## 2022-02-14 MED ORDER — GUAIFENESIN ER 600 MG PO TB12: 600.0000 mg | ORAL_TABLET | Freq: Two times a day (BID) | ORAL | 0 refills | Status: AC

## 2022-02-14 NOTE — ED Triage Notes (Signed)
Pt c/o cough, nasal drainage, lightheaded, body aches   Onset ~ 4 days ago

## 2022-02-14 NOTE — ED Provider Notes (Signed)
[facility]    CSN: [id number] Arrival date & time: 02/14/22  0804      History   Chief Complaint Chief Complaint  Patient presents with   Cough    HPI Brandy [name] is a 56 y.o. female.   Pleasant 56 year old female presents today with concern of body aches, diarrhea, harsh relatively dry cough, and sneezing since Thursday of last week.  She believes it is due to the change in weather going from 80 degrees to 30 degrees.  She does have a history of allergies.  She has been taking over-the-counter Coricidin without significant relief.  She has not taken a home COVID test.  She reports that her 47-year-old granddaughter was sick last week, uncertain if she got it from her.  Denies any abdominal pain.  No nausea or vomiting.   Cough   Past Medical History:  Diagnosis Date   Allergy    Anxiety    Depression    Diverticulitis    Edema    Gallstones    GERD (gastroesophageal reflux disease)    Hyperlipidemia    Hypertension    Osteoarthritis     Patient Active Problem List   Diagnosis Date Noted   Type 2 diabetes mellitus ([place]) 12/22/2021   Obesity (BMI 35.0-39.9 without comorbidity) 10/24/2016   Bipolar disorder ([place]) 04/29/2015   Insomnia 03/13/2015   Major depression, recurrent ([place]) 03/10/2015   Generalized anxiety disorder 03/10/2015   Dizziness and giddiness 02/20/2015   GERD (gastroesophageal reflux disease) 02/20/2015   Abnormal CXR    Dyspnea 02/02/2015   Hypoxia 02/02/2015   Acute pulmonary edema ([place]) 02/02/2015   Hypertensive urgency 02/02/2015   Hypertensive crisis    Nausea with vomiting 06/09/2011   HTN (hypertension) 12/02/2010   Diverticulitis of sigmoid colon 12/02/2010   Tobacco dependence 12/02/2010    Past Surgical History:  Procedure Laterality Date   GALLBLADDER SURGERY      OB History   No obstetric history on file.      Home Medications    Prior to Admission medications   Medication Sig Start Date End  Date Taking? Authorizing Provider  albuterol (VENTOLIN HFA) 108 (90 Base) MCG/ACT inhaler Inhale 2 puffs into the lungs every 4 (four) hours as needed for wheezing or shortness of breath. Patient not taking: Reported on 12/22/2021 09/16/19   Hall-Potvin, [place], PA-C  amLODipine (NORVASC) 5 MG tablet Take 1 tablet (5 mg total) by mouth daily. 12/22/21   Charlott Rakes, MD  aspirin EC 81 MG EC tablet Take 1 tablet (81 mg total) by mouth daily. 02/05/15   Domenic Polite, MD  carvedilol (COREG) 25 MG tablet Take 1 tablet (25 mg total) by mouth 2 (two) times daily with a meal. 12/22/21   Charlott Rakes, MD  cetirizine (ZYRTEC) 10 MG tablet Take 1 tablet (10 mg total) by mouth daily. 09/16/19   Hall-Potvin, [place], PA-C  cloNIDine (CATAPRES) 0.1 MG tablet Take 1 tablet (0.1 mg total) by mouth at bedtime as needed for hot flashes. 12/22/21   Charlott Rakes, MD  Continuous Blood Gluc Sensor (FREESTYLE LIBRE 3 SENSOR) MISC Place 1 sensor on the skin every 14 days. Use to check glucose continuously 12/23/21   Charlott Rakes, MD  DULoxetine (CYMBALTA) 60 MG capsule Take 1 capsule (60 mg total) by mouth daily. 12/29/21   Arfeen, Arlyce Harman, MD  fluticasone (FLONASE) 50 MCG/ACT nasal spray Place 1 spray into both nostrils daily. 12/22/21   Charlott Rakes, MD  furosemide (LASIX)  20 MG tablet Take 1 tablet (20 mg total) by mouth daily. 05/18/21   Charlott Rakes, MD  gabapentin (NEURONTIN) 100 MG capsule Take 1 capsule (100 mg total) by mouth 4 (four) times daily. 12/29/21   Arfeen, Arlyce Harman, MD  haloperidol (HALDOL) 5 MG tablet Take 1 tablet (5 mg total) by mouth at bedtime. 12/29/21   Arfeen, Arlyce Harman, MD  lamoTRIgine (LAMICTAL) 150 MG tablet Take 1 tablet (150 mg total) by mouth 2 (two) times daily. Patient not taking: Reported on 12/29/2021 06/18/21   Arfeen, Arlyce Harman, MD  losartan-hydrochlorothiazide (HYZAAR) 100-25 MG tablet Take 1 tablet by mouth daily. 12/22/21   Charlott Rakes, MD  Lurasidone HCl 120 MG TABS Take 1  tablet (120 mg total) by mouth daily with breakfast. Patient not taking: Reported on 12/29/2021 10/07/21   Arfeen, Arlyce Harman, MD  meloxicam (MOBIC) 7.5 MG tablet Take 1 tablet (7.5 mg total) by mouth daily. 05/18/21   Charlott Rakes, MD  metFORMIN (GLUCOPHAGE) 500 MG tablet Take 1 tablet (500 mg total) by mouth 2 (two) times daily with a meal. 01/03/22   Charlott Rakes, MD  nitroGLYCERIN (NITRODUR - DOSED IN MG/24 HR) 0.2 mg/hr patch Place 1 patch (0.2 mg total) onto the skin daily. Apply patch near the affected area and change location daily. 07/14/21   Suzan Slick, NP  pantoprazole (PROTONIX) 40 MG tablet Take 1 tablet (40 mg total) by mouth daily. 12/22/21   Charlott Rakes, MD  potassium chloride SA (KLOR-CON M) 20 MEQ tablet Take 1 tablet (20 mEq total) by mouth daily. 12/22/21   Charlott Rakes, MD  predniSONE (DELTASONE) 10 MG tablet Take 1 tablet (10 mg total) by mouth daily. 07/14/21   Suzan Slick, NP  Semaglutide,0.25 or 0.'5MG'$ /DOS, (OZEMPIC, 0.25 OR 0.5 MG/DOSE,) 2 MG/3ML SOPN Inject 0.25 mg into the skin once a week. 12/22/21   Charlott Rakes, MD  traZODone (DESYREL) 150 MG tablet Take 1 tablet (150 mg total) by mouth at bedtime. 12/29/21   Arfeen, Arlyce Harman, MD  losartan (COZAAR) 100 MG tablet Take 1 tablet (100 mg total) by mouth daily. 03/16/20 05/05/20  Charlott Rakes, MD    Family History Family History  Problem Relation Age of Onset   Alcohol abuse Mother    Hypertension Father    Diabetes Sister    Schizophrenia Maternal Aunt    Breast cancer Paternal Aunt        48s   Schizophrenia Maternal Grandmother    Coronary artery disease Paternal Grandmother        young age   Diabetes Paternal Grandmother    Colon cancer Neg Hx    Esophageal cancer Neg Hx    Stomach cancer Neg Hx    Rectal cancer Neg Hx     Social History Social History   Tobacco Use   Smoking status: Every Day    Packs/day: 0.50    Years: 10.00    Total pack years: 5.00    Types: Cigarettes   Smokeless  tobacco: Never  Vaping Use   Vaping Use: Never used  Substance Use Topics   Alcohol use: Yes    Alcohol/week: 3.0 standard drinks of alcohol    Types: 1 Glasses of wine, 2 Cans of beer per week   Drug use: Yes    Types: Marijuana     Allergies   Azithromycin, Contrast media [iodinated contrast media], and Shellfish allergy   Review of Systems Review of Systems  Respiratory:  Positive for  cough.      Physical Exam Triage Vital Signs ED Triage Vitals  Enc Vitals Group     BP 02/14/22 0825 115/87     Pulse Rate 02/14/22 0825 83     Resp 02/14/22 0825 18     Temp 02/14/22 0825 98.4 F (36.9 C)     Temp Source 02/14/22 0825 Oral     SpO2 02/14/22 0825 97 %     Weight --      Height --      Head Circumference --      Peak Flow --      Pain Score 02/14/22 0826 6     Pain Loc --      Pain Edu? --      Excl. in [place]? --    No data found.  Updated Vital Signs BP 115/87 (BP Location: Right Arm)   Pulse 83   Temp 98.4 F (36.9 C) (Oral)   Resp 18   LMP 11/02/2020   SpO2 97%   Visual Acuity Right Eye Distance:   Left Eye Distance:   Bilateral Distance:    Right Eye Near:   Left Eye Near:    Bilateral Near:     Physical Exam   UC Treatments / Results  Labs (all labs ordered are listed, but only abnormal results are displayed) Labs Reviewed  RESP PANEL BY RT-PCR (RSV, FLU A&B, COVID)  [id number]    EKG   Radiology No results found.  Procedures Procedures (including critical care time)  Medications Ordered in UC Medications - No data to display  Initial Impression / Assessment and Plan / UC Course  I have reviewed the triage vital signs and the nursing notes.  Pertinent labs & imaging results that were available during my care of the patient were reviewed by me and considered in my medical decision making (see chart for details).     *** Final Clinical Impressions(s) / UC Diagnoses   Final diagnoses:  Flu-like symptoms   Discharge  Instructions   None    ED Prescriptions   None    PDMP not reviewed this encounter.

## 2022-02-14 NOTE — Discharge Instructions (Addendum)
We have obtained a flu, RSV, COVID swab on you today. Your vital signs are stable and her lungs are clear. I suspect your symptoms to be related to a viral infection. I would encourage you to take Mucinex twice daily with plenty of water. Please read the attached handouts of foods to avoid with diarrhea. Please avoid over-the-counter antidiarrheal medications.  This should resolve when your viral infection resolves. I have called in a medication to help with your cough at nighttime.

## 2022-02-15 ENCOUNTER — Ambulatory Visit (INDEPENDENT_AMBULATORY_CARE_PROVIDER_SITE_OTHER): Payer: PPO | Admitting: Licensed Clinical Social Worker

## 2022-02-15 DIAGNOSIS — F319 Bipolar disorder, unspecified: Secondary | ICD-10-CM | POA: Diagnosis not present

## 2022-02-15 DIAGNOSIS — F411 Generalized anxiety disorder: Secondary | ICD-10-CM

## 2022-02-15 NOTE — Plan of Care (Signed)
  Problem: Depression Goal:  Decrease depressive symptoms and improve levels of effective functioning-pt reports a decrease in overall depression symptoms 3 out of 5 sessions documented.  Outcome: Not Progressing Goal: Develop healthy thinking patterns and beliefs about self, others, and the world that lead to the alleviation and help prevent the relapse of depression per self report 3 out of 5 sessions documented.   Outcome: Not Progressing Intervention: Encourage verbalization of feelings/concerns/expectations Note: Allowed pt to explore/express Intervention: Discuss self-management skills Note: Reviewed emotion regulation    Problem: Anxiety  Goal: Reduce overall frequency, intensity, and duration of the anxiety so that daily functioning is not impaired per pt self report 3 out of 5 sessions documented.   Outcome: Not Progressing Goal: Learn and implement coping skills that result in a reduction of anxiety and worry, and improve daily functioning per pt report 3 out of 5 sessions documented  Outcome: Not Progressing Intervention: Assist with coping skills and behavior Note: Reviewed anxiety management
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
## 2022-02-16 ENCOUNTER — Ambulatory Visit: Payer: PPO | Admitting: Family Medicine

## 2022-03-09 ENCOUNTER — Other Ambulatory Visit (HOSPITAL_COMMUNITY): Payer: Self-pay

## 2022-03-17 ENCOUNTER — Telehealth (HOSPITAL_COMMUNITY): Payer: PPO | Admitting: Psychiatry

## 2022-04-01 ENCOUNTER — Other Ambulatory Visit (HOSPITAL_COMMUNITY): Payer: Self-pay

## 2022-04-12 ENCOUNTER — Ambulatory Visit (INDEPENDENT_AMBULATORY_CARE_PROVIDER_SITE_OTHER): Payer: PPO | Admitting: Licensed Clinical Social Worker

## 2022-04-12 DIAGNOSIS — Z91199 Patient's noncompliance with other medical treatment and regimen due to unspecified reason: Secondary | ICD-10-CM

## 2022-04-12 NOTE — Progress Notes (Signed)
LCSW counselor attempted to connect with patient for scheduled appointment via MyChart video text request x 2 and email request with no response; also attempted to connect via phone without success.   Attempt 1: Text and email: 11:05a  Attempt 2: Text and email: 11:10a  Attempt 3: phone call: 11:15p.  Attempted VM unsuccessfully--could not leave message.   Per [facility] policy, after multiple attempts to reach pt unsuccessfully at appointed time--visit will be coded as no show

## 2022-04-27 ENCOUNTER — Other Ambulatory Visit: Payer: Self-pay | Admitting: Family Medicine

## 2022-04-27 ENCOUNTER — Other Ambulatory Visit (HOSPITAL_COMMUNITY): Payer: Self-pay

## 2022-04-27 DIAGNOSIS — R600 Localized edema: Secondary | ICD-10-CM

## 2022-04-27 MED ORDER — FUROSEMIDE 20 MG PO TABS
20.0000 mg | ORAL_TABLET | Freq: Every day | ORAL | 0 refills | Status: DC
  Filled 2022-04-27: qty 90, 90d supply, fill #0

## 2022-04-27 NOTE — Telephone Encounter (Signed)
Requested Prescriptions  Pending Prescriptions Disp Refills   furosemide (LASIX) 20 MG tablet 90 tablet 0    Sig: Take 1 tablet (20 mg total) by mouth daily.     Cardiovascular:  Diuretics - Loop Failed - 04/27/2022  7:58 AM      Failed - Mg Level in normal range and within 180 days    No results found for: "MG"       Passed - K in normal range and within 180 days    Potassium  Date Value Ref Range Status  12/22/2021 4.0 3.5 - 5.2 mmol/L Final         Passed - Ca in normal range and within 180 days    Calcium  Date Value Ref Range Status  12/22/2021 9.8 8.7 - 10.2 mg/dL Final         Passed - Na in normal range and within 180 days    Sodium  Date Value Ref Range Status  12/22/2021 142 134 - 144 mmol/L Final         Passed - Cr in normal range and within 180 days    Creat  Date Value Ref Range Status  04/29/2015 0.93 0.50 - 1.10 mg/dL Final   Creatinine, Ser  Date Value Ref Range Status  12/22/2021 0.82 0.57 - 1.00 mg/dL Final         Passed - Cl in normal range and within 180 days    Chloride  Date Value Ref Range Status  12/22/2021 102 96 - 106 mmol/L Final         Passed - Last BP in normal range    BP Readings from Last 1 Encounters:  12/22/21 114/77         Passed - Valid encounter within last 6 months    Recent Outpatient Visits           4 months ago Hypertension associated with diabetes [facility])   [place] Charlott Rakes, MD   11 months ago Essential hypertension   [place], Enobong, MD   1 year ago Need for hepatitis C screening test   [place], [place], [place]   1 year ago Muscle spasm   [place], Charlane Ferretti, MD   2 years ago Acute non-recurrent sinusitis of other sinus   [place] Charlott Rakes, MD

## 2022-05-20 ENCOUNTER — Other Ambulatory Visit: Payer: Self-pay | Admitting: Family Medicine

## 2022-05-20 ENCOUNTER — Other Ambulatory Visit (HOSPITAL_COMMUNITY): Payer: Self-pay

## 2022-05-20 MED ORDER — METFORMIN HCL 500 MG PO TABS
500.0000 mg | ORAL_TABLET | Freq: Two times a day (BID) | ORAL | 0 refills | Status: DC
  Filled 2022-05-20: qty 180, 90d supply, fill #0

## 2022-05-20 NOTE — Telephone Encounter (Signed)
Requested Prescriptions  Pending Prescriptions Disp Refills   metFORMIN (GLUCOPHAGE) 500 MG tablet 180 tablet 0    Sig: Take 1 tablet (500 mg total) by mouth 2 (two) times daily with a meal.     Endocrinology:  Diabetes - Biguanides Failed - 05/20/2022  9:53 AM      Failed - HBA1C is between 0 and 7.9 and within 180 days    HbA1c, POC (controlled diabetic range)  Date Value Ref Range Status  12/22/2021 8.6 (A) 0.0 - 7.0 % Final         Failed - B12 Level in normal range and within 720 days    No results found for: "VITAMINB12"       Failed - CBC within normal limits and completed in the last 12 months    WBC  Date Value Ref Range Status  10/08/2015 5.7 4.0 - 10.5 K/uL Final   RBC  Date Value Ref Range Status  10/08/2015 4.27 3.87 - 5.11 MIL/uL Final   Hemoglobin  Date Value Ref Range Status  10/08/2015 12.2 12.0 - 15.0 g/dL Final   HCT  Date Value Ref Range Status  10/08/2015 37.6 36.0 - 46.0 % Final   MCHC  Date Value Ref Range Status  10/08/2015 32.4 30.0 - 36.0 g/dL Final   [facility]  Date Value Ref Range Status  10/08/2015 28.6 26.0 - 34.0 pg Final   MCV  Date Value Ref Range Status  10/08/2015 88.1 78.0 - 100.0 fL Final   No results found for: "PLTCOUNTKUC", "LABPLAT", "POCPLA" RDW  Date Value Ref Range Status  10/08/2015 15.1 11.5 - 15.5 % Final         Passed - Cr in normal range and within 360 days    Creat  Date Value Ref Range Status  04/29/2015 0.93 0.50 - 1.10 mg/dL Final   Creatinine, Ser  Date Value Ref Range Status  12/22/2021 0.82 0.57 - 1.00 mg/dL Final         Passed - eGFR in normal range and within 360 days    GFR calc Af Amer  Date Value Ref Range Status  05/05/2020 64 >59 mL/min/1.73 Final    Comment:    **In accordance with recommendations from the NKF-ASN Task force,**   [facility] is in the process of updating its eGFR calculation to the   2021 CKD-EPI creatinine equation that estimates kidney function   without a race variable.     GFR calc non Af Amer  Date Value Ref Range Status  05/05/2020 55 (L) >59 mL/min/1.73 Final   GFR  Date Value Ref Range Status  06/09/2011 93.93 >60.00 mL/min Final   eGFR  Date Value Ref Range Status  12/22/2021 84 >59 mL/min/1.73 Final         Passed - Valid encounter within last 6 months    Recent Outpatient Visits           4 months ago Hypertension associated with diabetes [facility])   [place] Charlott Rakes, MD   1 year ago Essential hypertension   [place], Enobong, MD   1 year ago Need for hepatitis C screening test   [place], [place]   2 years ago Muscle spasm   [place], Charlane Ferretti, MD   2 years ago Acute non-recurrent sinusitis of other sinus   [facility]  [place], [place]       Future Appointments             In 1 month Charlott Rakes, MD [place]

## 2022-05-23 ENCOUNTER — Other Ambulatory Visit (HOSPITAL_COMMUNITY): Payer: Self-pay

## 2022-06-16 ENCOUNTER — Other Ambulatory Visit: Payer: Self-pay

## 2022-06-16 ENCOUNTER — Other Ambulatory Visit (HOSPITAL_COMMUNITY): Payer: Self-pay

## 2022-06-16 ENCOUNTER — Telehealth (HOSPITAL_BASED_OUTPATIENT_CLINIC_OR_DEPARTMENT_OTHER): Payer: PPO | Admitting: Psychiatry

## 2022-06-16 ENCOUNTER — Encounter (HOSPITAL_COMMUNITY): Payer: Self-pay | Admitting: Psychiatry

## 2022-06-16 VITALS — Wt 197.0 lb

## 2022-06-16 DIAGNOSIS — F319 Bipolar disorder, unspecified: Secondary | ICD-10-CM

## 2022-06-16 DIAGNOSIS — F5101 Primary insomnia: Secondary | ICD-10-CM | POA: Diagnosis not present

## 2022-06-16 DIAGNOSIS — F419 Anxiety disorder, unspecified: Secondary | ICD-10-CM | POA: Diagnosis not present

## 2022-06-16 MED ORDER — TRAZODONE HCL 100 MG PO TABS
100.0000 mg | ORAL_TABLET | Freq: Every day | ORAL | 0 refills | Status: DC
  Filled 2022-06-16: qty 30, 30d supply, fill #0

## 2022-06-16 MED ORDER — DULOXETINE HCL 60 MG PO CPEP
60.0000 mg | ORAL_CAPSULE | Freq: Every day | ORAL | 0 refills | Status: DC
  Filled 2022-06-16: qty 90, 90d supply, fill #0

## 2022-06-16 MED ORDER — HYDROXYZINE PAMOATE 25 MG PO CAPS
25.0000 mg | ORAL_CAPSULE | Freq: Two times a day (BID) | ORAL | 0 refills | Status: DC | PRN
  Filled 2022-06-16: qty 60, 30d supply, fill #0

## 2022-06-16 MED ORDER — HALOPERIDOL 5 MG PO TABS
5.0000 mg | ORAL_TABLET | Freq: Two times a day (BID) | ORAL | 0 refills | Status: DC
  Filled 2022-06-16: qty 10, 5d supply, fill #0
  Filled 2022-06-17: qty 50, 25d supply, fill #0

## 2022-06-16 NOTE — Progress Notes (Signed)
[facility] MD Virtual Progress Note   Patient Location: Home Provider Location: Office  I connect with patient by video and verified that I am speaking with correct person by using two identifiers. I discussed the limitations of evaluation and management by telemedicine and the availability of in person appointments. I also discussed with the patient that there may be a patient responsible charge related to this service. The patient expressed understanding and agreed to proceed.  Brandy [name] [id number] 57 y.o.  06/16/2022 1:10 PM  History of Present Illness:  Patient is a value good by video session.  She had missed last appointment and started to feel more depressed, anxious, irritable.  She has outbursts and anger.  She could not afford the medicine but started working 2-1/2 hours a day so she can just afford some medicine.  She tried gabapentin 4 times a day but could not afford and now taking 2 times a day.  She does not feel it works and helps anxiety.  She like to try a different medication.  She is in therapy with Margreta Journey but she had missed appointment.  She reported just taking metformin for diabetes because not able to get the injection.  She like to address this issue on her upcoming appointment with primary care doctor.  She reported paranoia, passive suicidal thoughts.  She also reported lately having hot flashes and not sure if causing by menopause.  She is relieved as 79 year old daughter now finally started driving and able to go back to work.  Patient lives with her husband.  She is not consistent with trazodone, Cymbalta, Haldol.  She takes on and off medicine.  She tried trazodone 150 but sleep too long.  Her appetite is fair.  Energy level is low.  She has no tremors or shakes.  Past Psychiatric History: H/O inpatient in 17-Jul-1995 after mother died.  Seen at [facility] from [place],   She saw Dr. Gildardo Griffes and did IOP in 07-17-15.  No h/o suicidal attempt.  H/O  paranoia, depression and paranoia. Had tried Zoloft, Wellbutrin, Rexulti, Klonopin, Abilify, Lexapro, Risperdal, temazepam, Lunesta and Seroquel.    Outpatient Encounter Medications as of 06/16/2022  Medication Sig   albuterol (VENTOLIN HFA) 108 (90 Base) MCG/ACT inhaler Inhale 2 puffs into the lungs every 4 (four) hours as needed for wheezing or shortness of breath. (Patient not taking: Reported on 12/22/2021)   amLODipine (NORVASC) 5 MG tablet Take 1 tablet (5 mg total) by mouth daily.   aspirin EC 81 MG EC tablet Take 1 tablet (81 mg total) by mouth daily.   benzonatate (TESSALON PERLES) 100 MG capsule Take 1 capsule (100 mg total) by mouth 2 (two) times daily as needed for cough.   carvedilol (COREG) 25 MG tablet Take 1 tablet (25 mg total) by mouth 2 (two) times daily with a meal.   cetirizine (ZYRTEC) 10 MG tablet Take 1 tablet (10 mg total) by mouth daily.   cloNIDine (CATAPRES) 0.1 MG tablet Take 1 tablet (0.1 mg total) by mouth at bedtime as needed for hot flashes.   Continuous Blood Gluc Sensor (FREESTYLE LIBRE 3 SENSOR) MISC Place 1 sensor on the skin every 14 days. Use to check glucose continuously   DULoxetine (CYMBALTA) 60 MG capsule Take 1 capsule (60 mg total) by mouth daily.   fluticasone (FLONASE) 50 MCG/ACT nasal spray Place 1 spray into both nostrils daily.   furosemide (LASIX) 20 MG tablet Take 1 tablet (20 mg total) by mouth daily.  gabapentin (NEURONTIN) 100 MG capsule Take 1 capsule (100 mg total) by mouth 4 (four) times daily.   guaiFENesin (MUCINEX) 600 MG 12 hr tablet Take 1 tablet (600 mg total) by mouth 2 (two) times daily.   haloperidol (HALDOL) 5 MG tablet Take 1 tablet (5 mg total) by mouth at bedtime.   lamoTRIgine (LAMICTAL) 150 MG tablet Take 1 tablet (150 mg total) by mouth 2 (two) times daily. (Patient not taking: Reported on 12/29/2021)   losartan-hydrochlorothiazide (HYZAAR) 100-25 MG tablet Take 1 tablet by mouth daily.   Lurasidone HCl 120 MG TABS Take 1  tablet (120 mg total) by mouth daily with breakfast. (Patient not taking: Reported on 12/29/2021)   meloxicam (MOBIC) 7.5 MG tablet Take 1 tablet (7.5 mg total) by mouth daily.   metFORMIN (GLUCOPHAGE) 500 MG tablet Take 1 tablet (500 mg total) by mouth 2 (two) times daily with a meal.   nitroGLYCERIN (NITRODUR - DOSED IN MG/24 HR) 0.2 mg/hr patch Place 1 patch (0.2 mg total) onto the skin daily. Apply patch near the affected area and change location daily.   pantoprazole (PROTONIX) 40 MG tablet Take 1 tablet (40 mg total) by mouth daily.   potassium chloride SA (KLOR-CON M) 20 MEQ tablet Take 1 tablet (20 mEq total) by mouth daily.   predniSONE (DELTASONE) 10 MG tablet Take 1 tablet (10 mg total) by mouth daily.   Semaglutide,0.25 or 0.'5MG'$ /DOS, (OZEMPIC, 0.25 OR 0.5 MG/DOSE,) 2 MG/3ML SOPN Inject 0.25 mg into the skin once a week.   traZODone (DESYREL) 150 MG tablet Take 1 tablet (150 mg total) by mouth at bedtime.   [DISCONTINUED] losartan (COZAAR) 100 MG tablet Take 1 tablet (100 mg total) by mouth daily.   No facility-administered encounter medications on file as of 06/16/2022.    No results found for this or any previous visit (from the past 2160 hour(s)).   Psychiatric Specialty Exam: Physical Exam  Review of Systems  Weight 197 lb (89.4 kg), last menstrual period 11/02/2020.There is no height or weight on file to calculate BMI.  General Appearance: Fairly Groomed  Eye Contact:  Fair  Speech:  Slow  Volume:  Decreased  Mood:  Anxious and Dysphoric  Affect:  Constricted and Depressed  Thought Process:  Descriptions of Associations: Intact  Orientation:  Full (Time, Place, and Person)  Thought Content:  Rumination  Suicidal Thoughts:  No  Homicidal Thoughts:  No  Memory:  Immediate;   Fair Recent;   Fair Remote;   Fair  Judgement:  Fair  Insight:  Shallow  Psychomotor Activity:  Decreased  Concentration:  Concentration: Fair and Attention Span: Fair  Recall:  [employer] of  Knowledge:  Fair  Language:  Fair  Akathisia:  No  Handed:  Right  AIMS (if indicated):     Assets:  Communication Skills Desire for Improvement Housing Transportation  ADL's:  Intact  Cognition:  WNL  Sleep:  poor     Assessment/Plan: Bipolar I disorder ([place]) - Plan: haloperidol (HALDOL) 5 MG tablet  Anxiety - Plan: DULoxetine (CYMBALTA) 60 MG capsule, hydrOXYzine (VISTARIL) 25 MG capsule  Primary insomnia - Plan: traZODone (DESYREL) 100 MG tablet, hydrOXYzine (VISTARIL) 25 MG capsule  Discussed current medication and residual symptoms which could be due to noncompliance with medication.  She has appointment coming up with PCP.  She had not consistent with therapy appointment.  Most of her issues due to finances as not able to afford the medication.  I recommend discontinuing gabapentin  since not taking on a prescribed regime and not helping.  We will try hydroxyzine 25 mg 2 times a day.  I will also optimize Haldol 5 mg 2 times a day to help the mood lability.  Decrease trazodone from 150 to 100 mg.  Continue Cymbalta 60 mg daily.  Encouraged to continue therapy with Christina.  Discussed safety concerns and any time having active suicidal thoughts or homicidal thought then she need to call 911 or go to local emergency room.  Follow-up in 4 weeks.   Follow Up Instructions:     I discussed the assessment and treatment plan with the patient. The patient was provided an opportunity to ask questions and all were answered. The patient agreed with the plan and demonstrated an understanding of the instructions.   The patient was advised to call back or seek an in-person evaluation if the symptoms worsen or if the condition fails to improve as anticipated.    Collaboration of Care: Other provider involved in patient's care AEB notes are available in epic to review.  Patient/Guardian was advised Release of Information must be obtained prior to any record release in order to collaborate  their care with an outside provider. Patient/Guardian was advised if they have not already done so to contact the registration department to sign all necessary forms in order for [name] to release information regarding their care.   Consent: Patient/Guardian gives verbal consent for treatment and assignment of benefits for services provided during this visit. Patient/Guardian expressed understanding and agreed to proceed.     I provided 32 minutes of non face to face time during this encounter.  [name], MD 06/16/2022

## 2022-06-17 ENCOUNTER — Other Ambulatory Visit (HOSPITAL_COMMUNITY): Payer: Self-pay

## 2022-06-30 ENCOUNTER — Other Ambulatory Visit (HOSPITAL_COMMUNITY): Payer: Self-pay

## 2022-06-30 ENCOUNTER — Other Ambulatory Visit: Payer: Self-pay | Admitting: Family Medicine

## 2022-06-30 DIAGNOSIS — E1159 Type 2 diabetes mellitus with other circulatory complications: Secondary | ICD-10-CM

## 2022-06-30 MED ORDER — AMLODIPINE BESYLATE 5 MG PO TABS
5.0000 mg | ORAL_TABLET | Freq: Every day | ORAL | 0 refills | Status: DC
  Filled 2022-06-30: qty 30, 30d supply, fill #0

## 2022-06-30 MED ORDER — LOSARTAN POTASSIUM-HCTZ 100-25 MG PO TABS
1.0000 | ORAL_TABLET | Freq: Every day | ORAL | 0 refills | Status: DC
  Filled 2022-06-30: qty 30, 30d supply, fill #0

## 2022-07-01 ENCOUNTER — Other Ambulatory Visit (HOSPITAL_COMMUNITY): Payer: Self-pay

## 2022-07-04 ENCOUNTER — Ambulatory Visit (INDEPENDENT_AMBULATORY_CARE_PROVIDER_SITE_OTHER): Payer: PPO | Admitting: Licensed Clinical Social Worker

## 2022-07-04 DIAGNOSIS — F419 Anxiety disorder, unspecified: Secondary | ICD-10-CM

## 2022-07-04 DIAGNOSIS — F319 Bipolar disorder, unspecified: Secondary | ICD-10-CM

## 2022-07-04 NOTE — Progress Notes (Signed)
Virtual Visit via Video Note  I connected with Brandy [name] on 07/04/22 at  3:00 PM EDT by a video enabled telemedicine application and verified that I am speaking with the correct person using two identifiers.  Location: Patient: home Provider: remote office Brandy [name], [place])   I discussed the limitations of evaluation and management by telemedicine and the availability of in person appointments. The patient expressed understanding and agreed to proceed.   I discussed the assessment and treatment plan with the patient. The patient was provided an opportunity to ask questions and all were answered. The patient agreed with the plan and demonstrated an understanding of the instructions.   The patient was advised to call back or seek an in-person evaluation if the symptoms worsen or if the condition fails to improve as anticipated.  I provided 50 minutes of non-face-to-face time during this encounter.   Brandy [name] R Brandy Rothgeb, LCSW  THERAPIST PROGRESS NOTE  Session Time: 1-150p  Participation Level: Active  Behavioral Response: Neat and Well GroomedAlertAnxious and Depressed  Type of Therapy: Individual Therapy  Treatment Goals addressed:    Problem: Depression Goal:  Decrease depressive symptoms and improve levels of effective functioning-pt reports a decrease in overall depression symptoms 3 out of 5 sessions documented.  Outcome: Not Progressing Goal: Develop healthy thinking patterns and beliefs about self, others, and the world that lead to the alleviation and help prevent the relapse of depression per self report 3 out of 5 sessions documented.   Outcome: Not Progressing Intervention: Encourage verbalization of feelings/concerns/expectations Note: Allowed pt to explore/express Intervention: Discuss self-management skills Note: Reviewed emotion regulation    Problem: Anxiety  Goal: Reduce overall frequency, intensity, and duration of the anxiety so that daily  functioning is not impaired per pt self report 3 out of 5 sessions documented.   Outcome: Not Progressing Goal: Learn and implement coping skills that result in a reduction of anxiety and worry, and improve daily functioning per pt report 3 out of 5 sessions documented  Outcome: Not Progressing Intervention: Assist with coping skills and behavior Note: Reviewed anxiety management      ProgressTowards Goals: Not Progressing  Interventions: CBT, DBT, Solution Focused, and Family Systems  Summary: Brandy [name] is a 57 y.o. female who presents with continuing symptoms related to bipolar disorder. Pt reports that she is compliant with medication but sometimes wants to stop taking her medication because "its just too much". Pt reports that she is getting good quality and quantity of sleep, which is good because she doesn't feel well (has cold symptoms).   Allowed pt to explore and express thoughts and feelings associated with recent life situations and external stressors.  Not leaving the house--feel safe at home  Watch EV, telephone  Waking up at 2amm every night. Go to bed around 9pm   Appetite fluctuating "everything has a funny taste to me". Husband says in my head.  Diabetic now--just got diagnosis. Just had my first meal not too long ago.   Tastes bad--chemical/metallic. Didn't used taste like that.   Not getting along with everyone--don't want to be bother   Continued recommendations are as follows: self care behaviors, positive social engagements, focusing on overall work/home/life balance, and focusing on positive physical and emotional wellness.   Suicidal/Homicidal: Yes--pt reports passive thoughts of "hurting sister and her son" triggered by verbal altercation today with no plans on how to harm them and no intent to follow through.   Therapist Response: Pt is continuing to apply  interventions learned in session into daily life situations. Pt is currently on track to  meet goals utilizing interventions mentioned above. Personal growth and progress noted. Treatment to continue as indicated.   Plan: Return again in 4 weeks.  Diagnosis: No diagnosis found.  Collaboration of Care: Other pt encouraged to continue care with psychiatrist of record, Dr. Berniece [name]  Patient/Guardian was advised Release of Information must be obtained prior to any record release in order to collaborate their care with an outside provider. Patient/Guardian was advised if they have not already done so to contact the registration department to sign all necessary forms in order for [name] to release information regarding their care.   Consent: Patient/Guardian gives verbal consent for treatment and assignment of benefits for services provided during this visit. Patient/Guardian expressed understanding and agreed to proceed.   [place], LCSW 07/04/2022

## 2022-07-18 ENCOUNTER — Other Ambulatory Visit (HOSPITAL_COMMUNITY): Payer: Self-pay

## 2022-07-18 ENCOUNTER — Encounter: Payer: Self-pay | Admitting: Family Medicine

## 2022-07-18 ENCOUNTER — Ambulatory Visit: Payer: PPO | Attending: Family Medicine | Admitting: Family Medicine

## 2022-07-18 VITALS — BP 114/80 | HR 91 | Wt 197.4 lb

## 2022-07-18 DIAGNOSIS — E1159 Type 2 diabetes mellitus with other circulatory complications: Secondary | ICD-10-CM | POA: Diagnosis not present

## 2022-07-18 DIAGNOSIS — N951 Menopausal and female climacteric states: Secondary | ICD-10-CM | POA: Diagnosis not present

## 2022-07-18 DIAGNOSIS — I152 Hypertension secondary to endocrine disorders: Secondary | ICD-10-CM | POA: Diagnosis not present

## 2022-07-18 DIAGNOSIS — E1142 Type 2 diabetes mellitus with diabetic polyneuropathy: Secondary | ICD-10-CM

## 2022-07-18 DIAGNOSIS — E1169 Type 2 diabetes mellitus with other specified complication: Secondary | ICD-10-CM | POA: Diagnosis not present

## 2022-07-18 DIAGNOSIS — F319 Bipolar disorder, unspecified: Secondary | ICD-10-CM | POA: Diagnosis not present

## 2022-07-18 DIAGNOSIS — J302 Other seasonal allergic rhinitis: Secondary | ICD-10-CM | POA: Diagnosis not present

## 2022-07-18 LAB — GLUCOSE, POCT (MANUAL RESULT ENTRY): POC Glucose: 182 mg/dl — AB (ref 70–99)

## 2022-07-18 LAB — POCT GLYCOSYLATED HEMOGLOBIN (HGB A1C): HbA1c, POC (controlled diabetic range): 7 % (ref 0.0–7.0)

## 2022-07-18 MED ORDER — AMLODIPINE BESYLATE 5 MG PO TABS
5.0000 mg | ORAL_TABLET | Freq: Every day | ORAL | 1 refills | Status: DC
  Filled 2022-07-18 – 2022-08-02 (×2): qty 90, 90d supply, fill #0
  Filled 2022-11-03: qty 90, 90d supply, fill #1

## 2022-07-18 MED ORDER — ACCU-CHEK GUIDE VI STRP
ORAL_STRIP | 12 refills | Status: AC
  Filled 2022-07-18: qty 100, 33d supply, fill #0
  Filled 2022-12-19: qty 100, 33d supply, fill #1
  Filled 2023-01-26: qty 100, 33d supply, fill #2

## 2022-07-18 MED ORDER — ONETOUCH DELICA PLUS LANCET33G MISC
3 refills | Status: AC
  Filled 2022-07-18: qty 100, 30d supply, fill #0
  Filled 2022-12-19: qty 100, 30d supply, fill #1
  Filled 2023-01-26: qty 100, 30d supply, fill #2
  Filled 2023-06-13: qty 100, 30d supply, fill #3

## 2022-07-18 MED ORDER — OZEMPIC (0.25 OR 0.5 MG/DOSE) 2 MG/3ML ~~LOC~~ SOPN
0.2500 mg | PEN_INJECTOR | SUBCUTANEOUS | 6 refills | Status: DC
  Filled 2022-07-18: qty 3, 56d supply, fill #0
  Filled 2022-09-05: qty 3, 56d supply, fill #1
  Filled 2022-10-17: qty 3, 56d supply, fill #2
  Filled 2022-11-24 – 2022-11-28 (×2): qty 3, 56d supply, fill #3

## 2022-07-18 MED ORDER — GABAPENTIN 300 MG PO CAPS
300.0000 mg | ORAL_CAPSULE | Freq: Every day | ORAL | 1 refills | Status: DC
  Filled 2022-07-18: qty 90, 90d supply, fill #0
  Filled 2022-12-19: qty 90, 90d supply, fill #1

## 2022-07-18 MED ORDER — LOSARTAN POTASSIUM-HCTZ 100-25 MG PO TABS
1.0000 | ORAL_TABLET | Freq: Every day | ORAL | 1 refills | Status: DC
  Filled 2022-07-18 – 2022-08-02 (×2): qty 90, 90d supply, fill #0
  Filled 2022-10-10: qty 60, 60d supply, fill #1
  Filled 2022-10-10: qty 30, 30d supply, fill #1
  Filled 2022-10-10: qty 90, 90d supply, fill #1

## 2022-07-18 MED ORDER — VEOZAH 45 MG PO TABS
1.0000 | ORAL_TABLET | Freq: Every day | ORAL | 1 refills | Status: DC
  Filled 2022-07-18 – 2022-12-19 (×2): qty 90, 90d supply, fill #0
  Filled 2022-12-22: qty 30, 30d supply, fill #0

## 2022-07-18 MED ORDER — CLONIDINE HCL 0.1 MG PO TABS
0.1000 mg | ORAL_TABLET | Freq: Every evening | ORAL | 1 refills | Status: DC | PRN
  Filled 2022-07-18 – 2022-09-23 (×2): qty 90, 90d supply, fill #0
  Filled 2022-12-19: qty 90, 90d supply, fill #1

## 2022-07-18 MED ORDER — CARVEDILOL 25 MG PO TABS
25.0000 mg | ORAL_TABLET | Freq: Two times a day (BID) | ORAL | 1 refills | Status: DC
  Filled 2022-07-18 – 2022-10-05 (×2): qty 180, 90d supply, fill #0
  Filled 2023-01-11: qty 180, 90d supply, fill #1

## 2022-07-18 MED ORDER — CETIRIZINE HCL 10 MG PO TABS
10.0000 mg | ORAL_TABLET | Freq: Every day | ORAL | 1 refills | Status: DC
  Filled 2022-07-18 – 2022-12-19 (×2): qty 90, 90d supply, fill #0
  Filled 2023-06-13: qty 90, 90d supply, fill #1

## 2022-07-18 MED ORDER — ONETOUCH ULTRA 2 W/DEVICE KIT
PACK | 0 refills | Status: DC
  Filled 2022-07-18: qty 1, 30d supply, fill #0

## 2022-07-18 MED ORDER — METFORMIN HCL 500 MG PO TABS
500.0000 mg | ORAL_TABLET | Freq: Two times a day (BID) | ORAL | 1 refills | Status: DC
  Filled 2022-07-18 – 2022-09-14 (×2): qty 180, 90d supply, fill #0
  Filled 2022-12-19: qty 180, 90d supply, fill #1

## 2022-07-18 NOTE — Patient Instructions (Signed)

## 2022-07-18 NOTE — Progress Notes (Signed)
Subjective:  Patient ID: Brandy [name], female    DOB: December 21, 1965  Age: 57 y.o. MRN: [id number]  CC: Dizziness and Diabetes   HPI SAMYIAH HALVORSEN is a 57 y.o. year old female with a history of  hypertension, bipolar disorder, vertigo, GERD, history of pulmonary edema, type 2 diabetes mellitus (A1c 7.0) who comes into the clinic for a follow-up visit   Interval History: A1c 7.0 down from 8.6 previously She never received the Ozempic but has been on metformin. She would like a Glucometer to prick her finger as she does not like her CGM as it goes off every night and her sugar reads low 60s.  She endorses skipping meals due to not feeling like eating. She is unhappy about her inability to lose weight.  Also complains of sharp pains in her feet. She has had holes with scratchy throat.  She Complains of hot flashes which occur mostly at night, uncontrolled on clonidine.  She Complains of feeling lightheaded and feels like she she will urinate on herself or use the bathroom on herself.  She is beginning to get anxious because she is afraid she will be in the middle of the 5 PM traffic on her way home. When she is home she feels safe. Her daughter moved out and she worries a lot about her. She continues to see her psychiatrist and is undergoing therapy but complains of extreme anxiety. Past Medical History:  Diagnosis Date   Allergy    Anxiety    Depression    Diverticulitis    Edema    Gallstones    GERD (gastroesophageal reflux disease)    Hyperlipidemia    Hypertension    Osteoarthritis     Past Surgical History:  Procedure Laterality Date   GALLBLADDER SURGERY      Family History  Problem Relation Age of Onset   Alcohol abuse Mother    Hypertension Father    Diabetes Sister    Schizophrenia Maternal Aunt    Breast cancer Paternal Aunt        51s   Schizophrenia Maternal Grandmother    Coronary artery disease Paternal Grandmother        young age   Diabetes  Paternal Grandmother    Colon cancer Neg Hx    Esophageal cancer Neg Hx    Stomach cancer Neg Hx    Rectal cancer Neg Hx     Social History   Socioeconomic History   Marital status: Married    Spouse name: Not on file   Number of children: 3   Years of education: Not on file   Highest education level: Not on file  Occupational History   Occupation: Curator: [employer]  Tobacco Use   Smoking status: Every Day    Packs/day: 0.50    Years: 10.00    Additional pack years: 0.00    Total pack years: 5.00    Types: Cigarettes   Smokeless tobacco: Never  Vaping Use   Vaping Use: Never used  Substance and Sexual Activity   Alcohol use: Yes    Alcohol/week: 3.0 standard drinks of alcohol    Types: 1 Glasses of wine, 2 Cans of beer per week   Drug use: Yes    Types: Marijuana   Sexual activity: Yes    Partners: Male    Birth control/protection: None  Other Topics Concern   Not on file  Social History Narrative   Not on  file   Social Determinants of Health   Financial Resource Strain: Not on file  Food Insecurity: Not on file  Transportation Needs: Not on file  Physical Activity: Not on file  Stress: Not on file  Social Connections: Not on file    Allergies  Allergen Reactions   Azithromycin Other (See Comments)    "Stomach cramps"   Contrast Media [Iodinated Contrast Media] Hives   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"    Outpatient Medications Prior to Visit  Medication Sig Dispense Refill   albuterol (VENTOLIN HFA) 108 (90 Base) MCG/ACT inhaler Inhale 2 puffs into the lungs every 4 (four) hours as needed for wheezing or shortness of breath. 18 g 0   aspirin EC 81 MG EC tablet Take 1 tablet (81 mg total) by mouth daily.     benzonatate (TESSALON PERLES) 100 MG capsule Take 1 capsule (100 mg total) by mouth 2 (two) times daily as needed for cough. 20 capsule 0   Continuous Blood Gluc Sensor (FREESTYLE LIBRE 3  SENSOR) MISC Place 1 sensor on the skin every 14 days. Use to check glucose continuously 2 each 11   DULoxetine (CYMBALTA) 60 MG capsule Take 1 capsule (60 mg total) by mouth daily. 90 capsule 0   fluticasone (FLONASE) 50 MCG/ACT nasal spray Place 1 spray into both nostrils daily. 16 g 1   furosemide (LASIX) 20 MG tablet Take 1 tablet (20 mg total) by mouth daily. 90 tablet 0   guaiFENesin (MUCINEX) 600 MG 12 hr tablet Take 1 tablet (600 mg total) by mouth 2 (two) times daily. 20 tablet 0   haloperidol (HALDOL) 5 MG tablet Take 1 tablet (5 mg total) by mouth 2 (two) times daily. 60 tablet 0   hydrOXYzine (VISTARIL) 25 MG capsule Take 1 capsule (25 mg total) by mouth 2 (two) times daily as needed. 60 capsule 0   lamoTRIgine (LAMICTAL) 150 MG tablet Take 1 tablet (150 mg total) by mouth 2 (two) times daily. 180 tablet 0   Lurasidone HCl 120 MG TABS Take 1 tablet (120 mg total) by mouth daily with breakfast. 90 tablet 0   meloxicam (MOBIC) 7.5 MG tablet Take 1 tablet (7.5 mg total) by mouth daily. 30 tablet 1   nitroGLYCERIN (NITRODUR - DOSED IN MG/24 HR) 0.2 mg/hr patch Place 1 patch (0.2 mg total) onto the skin daily. Apply patch near the affected area and change location daily. 30 patch 1   pantoprazole (PROTONIX) 40 MG tablet Take 1 tablet (40 mg total) by mouth daily. 90 tablet 1   potassium chloride SA (KLOR-CON M) 20 MEQ tablet Take 1 tablet (20 mEq total) by mouth daily. 90 tablet 1   predniSONE (DELTASONE) 10 MG tablet Take 1 tablet (10 mg total) by mouth daily. 30 tablet 0   traZODone (DESYREL) 100 MG tablet Take 1 tablet (100 mg total) by mouth at bedtime. 30 tablet 0   amLODipine (NORVASC) 5 MG tablet Take 1 tablet (5 mg total) by mouth daily. 30 tablet 0   carvedilol (COREG) 25 MG tablet Take 1 tablet (25 mg total) by mouth 2 (two) times daily with a meal. 180 tablet 1   cetirizine (ZYRTEC) 10 MG tablet Take 1 tablet (10 mg total) by mouth daily. 30 tablet 1   cloNIDine (CATAPRES) 0.1 MG  tablet Take 1 tablet (0.1 mg total) by mouth at bedtime as needed for hot flashes. 90 tablet 1   gabapentin (NEURONTIN)  100 MG capsule Take 1 capsule (100 mg total) by mouth 4 (four) times daily. 120 capsule 2   losartan-hydrochlorothiazide (HYZAAR) 100-25 MG tablet Take 1 tablet by mouth daily. 30 tablet 0   metFORMIN (GLUCOPHAGE) 500 MG tablet Take 1 tablet (500 mg total) by mouth 2 (two) times daily with a meal. 180 tablet 0   Semaglutide,0.25 or 0.5MG /DOS, (OZEMPIC, 0.25 OR 0.5 MG/DOSE,) 2 MG/3ML SOPN Inject 0.25 mg into the skin once a week. 3 mL 1   No facility-administered medications prior to visit.     ROS Review of Systems  Constitutional:  Negative for activity change and appetite change.  HENT:  Positive for voice change. Negative for sinus pressure and sore throat.   Respiratory:  Negative for chest tightness, shortness of breath and wheezing.   Cardiovascular:  Negative for chest pain and palpitations.  Gastrointestinal:  Negative for abdominal distention, abdominal pain and constipation.  Genitourinary: Negative.   Musculoskeletal: Negative.   Neurological:  Positive for dizziness.  Psychiatric/Behavioral:  Negative for behavioral problems and dysphoric mood. The patient is nervous/anxious.     Objective:  BP 114/80 (BP Location: Left Arm, Patient Position: Sitting, Cuff Size: Normal)   Pulse 91   Wt 197 lb 6.4 oz (89.5 kg)   LMP 11/02/2020   SpO2 96%   BMI 37.30 kg/m      07/18/2022    3:34 PM 06/16/2022    1:43 PM 02/14/2022    8:25 AM  BP/Weight  Systolic BP 114    Diastolic BP 80    Wt. (Lbs) 197.4    BMI 37.3 kg/m2       Information is confidential and restricted. Go to Review Flowsheets to unlock data.   Wt Readings from Last 3 Encounters:  07/18/22 197 lb 6.4 oz (89.5 kg)  12/22/21 197 lb 9.6 oz (89.6 kg)  05/28/21 200 lb (90.7 kg)       Physical Exam Constitutional:      Appearance: She is well-developed.  HENT:     Mouth/Throat:      Pharynx: Posterior oropharyngeal erythema present.  Cardiovascular:     Rate and Rhythm: Normal rate.     Heart sounds: Normal heart sounds. No murmur heard. Pulmonary:     Effort: Pulmonary effort is normal.     Breath sounds: Normal breath sounds. No wheezing or rales.  Chest:     Chest wall: No tenderness.  Abdominal:     General: Bowel sounds are normal. There is no distension.     Palpations: Abdomen is soft. There is no mass.     Tenderness: There is no abdominal tenderness.  Musculoskeletal:        General: Normal range of motion.     Right lower leg: No edema.     Left lower leg: No edema.  Neurological:     Mental Status: She is alert and oriented to person, place, and time.  Psychiatric:        Mood and Affect: Mood is anxious.    Diabetic Foot Exam - Simple   Simple Foot Form Diabetic Foot exam was performed with the following findings: Yes 07/18/2022  4:21 PM  Visual Inspection No deformities, no ulcerations, no other skin breakdown bilaterally: Yes Sensation Testing Intact to touch and monofilament testing bilaterally: Yes Pulse Check Posterior Tibialis and Dorsalis pulse intact bilaterally: Yes Comments         Latest Ref Rng & Units 12/22/2021    4:30 PM 05/18/2021  4:12 PM 05/05/2020   10:53 AM  CMP  Glucose 70 - 99 mg/dL 90  295  621   BUN 6 - 24 mg/dL Creatinine 0.57 - 1.00 mg/dL 3.08  6.57  8.46   Sodium 134 - 144 mmol/L 142  144  143   Potassium 3.5 - 5.2 mmol/L 4.0  4.2  4.2   Chloride 96 - 106 mmol/L 102  101  102   CO2 20 - 29 mmol/L Calcium 8.7 - 10.2 mg/dL 9.8  96.2  95.2   Total Protein 6.0 - 8.5 g/dL  7.8  7.8   Total Bilirubin 0.0 - 1.2 mg/dL  0.2  0.2   Alkaline Phos 44 - 121 IU/L  84  94   AST 0 - 40 IU/L  13  12   ALT 0 - 32 IU/L  16  15     Lipid Panel     Component Value Date/Time   CHOL 193 05/05/2020 1053   TRIG 215 (H) 05/05/2020 1053   HDL 49 05/05/2020 1053   CHOLHDL 3.9 05/05/2020 1053    CHOLHDL 3.1 02/03/2015 0420   VLDL 24 02/03/2015 0420   LDLCALC 107 (H) 05/05/2020 1053    CBC    Component Value Date/Time   WBC 5.7 10/08/2015 0815   RBC 4.27 10/08/2015 0815   HGB 12.2 10/08/2015 0815   HCT 37.6 10/08/2015 0815   PLT 220 10/08/2015 0815   MCV 88.1 10/08/2015 0815   MCH 28.6 10/08/2015 0815   MCHC 32.4 10/08/2015 0815   RDW 15.1 10/08/2015 0815   LYMPHSABS 1.8 10/08/2015 0815   MONOABS 0.2 10/08/2015 0815   EOSABS 0.2 10/08/2015 0815   BASOSABS 0.0 10/08/2015 0815    Lab Results  Component Value Date   HGBA1C 7.0 07/18/2022    Assessment & Plan:  1. Type 2 diabetes mellitus with other specified complication, without long-term current use of insulin Controlled with A1c of 7.0 I have contacted the pharmacy regarding a prior authorization for her Ozempic Advised to avoid skipping meals Counseled on Diabetic diet, my plate method, 841 minutes of moderate intensity exercise/week Blood sugar logs with fasting goals of 80-120 mg/dl, random of less than 324 and in the event of sugars less than 60 mg/dl or greater than 401 mg/dl encouraged to notify the clinic. Advised on the need for annual eye exams, annual foot exams, Pneumonia vaccine. - POCT glycosylated hemoglobin (Hb A1C) - POCT glucose (manual entry) - glucose blood (ACCU-CHEK GUIDE) test strip; Use as instructed three times daily  Dispense: 100 each; Refill: 12 - Blood Glucose Monitoring Suppl (ONE TOUCH ULTRA 2) w/Device KIT; Use as directed three times daily  Dispense: 1 kit; Refill: 0 - Lancets (ONETOUCH DELICA PLUS LANCET30G) MISC; Use as directed three times daily before meals  Dispense: 100 each; Refill: 12 - Microalbumin/Creatinine Ratio, Urine - Ambulatory referral to Ophthalmology - Basic Metabolic Panel - metFORMIN (GLUCOPHAGE) 500 MG tablet; Take 1 tablet (500 mg total) by mouth 2 (two) times daily with a meal.  Dispense: 180 tablet; Refill: 1 - Semaglutide,0.25 or 0.5MG /DOS, (OZEMPIC, 0.25  OR 0.5 MG/DOSE,) 2 MG/3ML SOPN; Inject 0.25 mg into the skin once a week.  Dispense: 3 mL; Refill: 6  2. Seasonal allergies - cetirizine (ZYRTEC) 10 MG tablet; Take 1 tablet (10 mg total) by mouth daily.  Dispense: 90 tablet; Refill: 1  3. Diabetic polyneuropathy associated with type  2 diabetes mellitus She has been on gabapentin but denies adherence. Advised to take it at night and have increased from 100 mg to 300 mg nightly - gabapentin (NEURONTIN) 300 MG capsule; Take 1 capsule (300 mg total) by mouth at bedtime.  Dispense: 90 capsule; Refill: 1  4. Bipolar I disorder Uncontrolled with ongoing anxiety Continue with cognitive behavioral therapy and follow-up with psych - gabapentin (NEURONTIN) 300 MG capsule; Take 1 capsule (300 mg total) by mouth at bedtime.  Dispense: 90 capsule; Refill: 1  5. Hypertension associated with diabetes Controlled Counseled on blood pressure goal of less than 130/80, low-sodium, DASH diet, medication compliance, 150 minutes of moderate intensity exercise per week. Discussed medication compliance, adverse effects. - amLODipine (NORVASC) 5 MG tablet; Take 1 tablet (5 mg total) by mouth daily.  Dispense: 90 tablet; Refill: 1 - carvedilol (COREG) 25 MG tablet; Take 1 tablet (25 mg total) by mouth 2 (two) times daily with a meal.  Dispense: 180 tablet; Refill: 1 - losartan-hydrochlorothiazide (HYZAAR) 100-25 MG tablet; Take 1 tablet by mouth daily.  Dispense: 90 tablet; Refill: 1  6. Vasomotor symptoms due to menopause Uncontrolled Will initiate Veozah - Fezolinetant (VEOZAH) 45 MG TABS; Take 1 tablet (45 mg total) by mouth daily.  Dispense: 90 tablet; Refill: 1 - cloNIDine (CATAPRES) 0.1 MG tablet; Take 1 tablet (0.1 mg total) by mouth at bedtime as needed for hot flashes.  Dispense: 90 tablet; Refill: 1   Health Care Maintenance: Will address at next visit. Meds ordered this encounter  Medications   glucose blood (ACCU-CHEK GUIDE) test strip    Sig: Use  as instructed three times daily    Dispense:  100 each    Refill:  12   Blood Glucose Monitoring Suppl (ONE TOUCH ULTRA 2) w/Device KIT    Sig: Use as directed three times daily    Dispense:  1 kit    Refill:  0   Lancets (ONETOUCH DELICA PLUS LANCET30G) MISC    Sig: Use as directed three times daily before meals    Dispense:  100 each    Refill:  12   Fezolinetant (VEOZAH) 45 MG TABS    Sig: Take 1 tablet (45 mg total) by mouth daily.    Dispense:  90 tablet    Refill:  1   cetirizine (ZYRTEC) 10 MG tablet    Sig: Take 1 tablet (10 mg total) by mouth daily.    Dispense:  90 tablet    Refill:  1   gabapentin (NEURONTIN) 300 MG capsule    Sig: Take 1 capsule (300 mg total) by mouth at bedtime.    Dispense:  90 capsule    Refill:  1   amLODipine (NORVASC) 5 MG tablet    Sig: Take 1 tablet (5 mg total) by mouth daily.    Dispense:  90 tablet    Refill:  1   carvedilol (COREG) 25 MG tablet    Sig: Take 1 tablet (25 mg total) by mouth 2 (two) times daily with a meal.    Dispense:  180 tablet    Refill:  1   cloNIDine (CATAPRES) 0.1 MG tablet    Sig: Take 1 tablet (0.1 mg total) by mouth at bedtime as needed for hot flashes.    Dispense:  90 tablet    Refill:  1   losartan-hydrochlorothiazide (HYZAAR) 100-25 MG tablet    Sig: Take 1 tablet by mouth daily.    Dispense:  90 tablet  Refill:  1   metFORMIN (GLUCOPHAGE) 500 MG tablet    Sig: Take 1 tablet (500 mg total) by mouth 2 (two) times daily with a meal.    Dispense:  180 tablet    Refill:  1   Semaglutide,0.25 or 0.5MG /DOS, (OZEMPIC, 0.25 OR 0.5 MG/DOSE,) 2 MG/3ML SOPN    Sig: Inject 0.25 mg into the skin once a week.    Dispense:  3 mL    Refill:  6    Follow-up: Return in about 1 month (around 08/17/2022) for Pap smear.       Hoy Register, MD, FAAFP. [facility] [place], [place] [phone]   07/18/2022, 6:16 PM

## 2022-07-19 ENCOUNTER — Other Ambulatory Visit: Payer: Self-pay

## 2022-07-19 ENCOUNTER — Telehealth (HOSPITAL_COMMUNITY): Payer: PPO | Admitting: Psychiatry

## 2022-07-19 ENCOUNTER — Other Ambulatory Visit (HOSPITAL_COMMUNITY): Payer: Self-pay

## 2022-07-19 LAB — BASIC METABOLIC PANEL
BUN/Creatinine Ratio: 15 (ref 9–23)
BUN: 14 mg/dL (ref 6–24)
CO2: 27 mmol/L (ref 20–29)
Calcium: 10.2 mg/dL (ref 8.7–10.2)
Chloride: 101 mmol/L (ref 96–106)
Creatinine, Ser: 0.96 mg/dL (ref 0.57–1.00)
Glucose: 137 mg/dL — ABNORMAL HIGH (ref 70–99)
Potassium: 4.1 mmol/L (ref 3.5–5.2)
Sodium: 143 mmol/L (ref 134–144)
eGFR: 69 mL/min/{1.73_m2} (ref >59)

## 2022-07-20 ENCOUNTER — Other Ambulatory Visit: Payer: Self-pay

## 2022-07-20 ENCOUNTER — Other Ambulatory Visit (HOSPITAL_COMMUNITY): Payer: Self-pay

## 2022-07-21 ENCOUNTER — Other Ambulatory Visit (HOSPITAL_COMMUNITY): Payer: Self-pay

## 2022-07-29 ENCOUNTER — Other Ambulatory Visit (HOSPITAL_COMMUNITY): Payer: Self-pay

## 2022-08-02 ENCOUNTER — Other Ambulatory Visit (HOSPITAL_COMMUNITY): Payer: Self-pay

## 2022-08-10 ENCOUNTER — Other Ambulatory Visit: Payer: Self-pay | Admitting: Family Medicine

## 2022-08-10 ENCOUNTER — Other Ambulatory Visit (HOSPITAL_COMMUNITY): Payer: Self-pay

## 2022-08-10 DIAGNOSIS — R600 Localized edema: Secondary | ICD-10-CM

## 2022-08-10 MED ORDER — FUROSEMIDE 20 MG PO TABS
20.0000 mg | ORAL_TABLET | Freq: Every day | ORAL | 0 refills | Status: DC
  Filled 2022-08-10 – 2022-09-05 (×2): qty 90, 90d supply, fill #0

## 2022-08-15 ENCOUNTER — Ambulatory Visit (INDEPENDENT_AMBULATORY_CARE_PROVIDER_SITE_OTHER): Payer: PPO | Admitting: Licensed Clinical Social Worker

## 2022-08-15 DIAGNOSIS — Z91199 Patient's noncompliance with other medical treatment and regimen due to unspecified reason: Secondary | ICD-10-CM

## 2022-08-15 NOTE — Progress Notes (Signed)
LCSW counselor attempted to connect with patient for scheduled appointment via MyChart video text request x 2 and email request with no response.   Attempt 1: Text and email: 3:06p  Attempt 2: Text and email: 3:10p  Video session was closed at  3:15p  Per [facility] policy, after multiple attempts to reach pt unsuccessfully at appointed time--visit will be coded as no show

## 2022-08-17 ENCOUNTER — Ambulatory Visit: Payer: PPO | Admitting: Family Medicine

## 2022-08-23 ENCOUNTER — Other Ambulatory Visit (HOSPITAL_COMMUNITY): Payer: Self-pay

## 2022-08-24 ENCOUNTER — Ambulatory Visit: Payer: PPO | Attending: Family Medicine

## 2022-08-24 VITALS — Ht 61.0 in | Wt 197.0 lb

## 2022-08-24 DIAGNOSIS — Z0000 Encounter for general adult medical examination without abnormal findings: Secondary | ICD-10-CM

## 2022-08-24 DIAGNOSIS — J328 Other chronic sinusitis: Secondary | ICD-10-CM

## 2022-08-24 MED ORDER — FLUTICASONE PROPIONATE 50 MCG/ACT NA SUSP
1.0000 | Freq: Every day | NASAL | 3 refills | Status: DC
  Filled 2022-08-24: qty 16, 60d supply, fill #0
  Filled 2022-12-19: qty 16, 60d supply, fill #1
  Filled 2023-06-13: qty 16, 60d supply, fill #2
  Filled 2023-08-08: qty 16, 60d supply, fill #3

## 2022-08-24 NOTE — Patient Instructions (Signed)
[name] , Thank you for taking time to come for your Medicare Wellness Visit. I appreciate your ongoing commitment to your health goals. Please review the following plan we discussed and let me know if I can assist you in the future.   These are the goals we discussed:  Goals      Blood Pressure < 140/90     Prevent falls        This is a list of the screening recommended for you and due dates:  Health Maintenance  Topic Date Due   COVID-19 Vaccine (1) Never done   Eye exam for diabetics  Never done   HIV Screening  Never done   Yearly kidney health urinalysis for diabetes  Never done   Pap Smear  Never done   Zoster (Shingles) Vaccine (1 of 2) Never done   Flu Shot  11/03/2022   Hemoglobin A1C  01/17/2023   Mammogram  07/17/2023   Yearly kidney function blood test for diabetes  07/18/2023   Complete foot exam   07/18/2023   Medicare Annual Wellness Visit  08/24/2023   DTaP/Tdap/Td vaccine (2 - Td or Tdap) 05/19/2031   Colon Cancer Screening  05/29/2031   Hepatitis C Screening: USPSTF Recommendation to screen - Ages 18-79 yo.  Completed   HPV Vaccine  Aged Out    Advanced directives: Information on Advanced Care Planning can be found at [facility] Advance Health Care Directives Advance Health Care Directives ([email or web address]    Conditions/risks identified: Aim for 30 minutes of exercise or brisk walking, 6-8 glasses of water, and 5 servings of fruits and vegetables each day.   Next appointment: Follow up in one year for your annual wellness visit.   Preventive Care 40-64 Years, Female Preventive care refers to lifestyle choices and visits with your health care provider that can promote health and wellness. What does preventive care include? A yearly physical exam. This is also called an annual well check. Dental exams once or twice a year. Routine eye exams. Ask your health care provider how often you should have your eyes checked. Personal lifestyle  choices, including: Daily care of your teeth and gums. Regular physical activity. Eating a healthy diet. Avoiding tobacco and drug use. Limiting alcohol use. Practicing safe sex. Taking low-dose aspirin daily starting at age 78. Taking vitamin and mineral supplements as recommended by your health care provider. What happens during an annual well check? The services and screenings done by your health care provider during your annual well check will depend on your age, overall health, lifestyle risk factors, and family history of disease. Counseling  Your health care provider may ask you questions about your: Alcohol use. Tobacco use. Drug use. Emotional well-being. Home and relationship well-being. Sexual activity. Eating habits. Work and work Astron[employer]. Method of birth control. Menstrual cycle. Pregnancy history. Screening  You may have the following tests or measurements: Height, weight, and BMI. Blood pressure. Lipid and cholesterol levels. These may be checked every 5 years, or more frequently if you are over 84 years old. Skin check. Lung cancer screening. You may have this screening every year starting at age 65 if you have a 30-pack-year history of smoking and currently smoke or have quit within the past 15 years. Fecal occult blood test (FOBT) of the stool. You may have this test every year starting at age 15. Flexible sigmoidoscopy or colonoscopy. You may have a sigmoidoscopy every 5 years or a colonoscopy every 10 years  starting at age 57. Hepatitis C blood test. Hepatitis B blood test. Sexually transmitted disease (STD) testing. Diabetes screening. This is done by checking your blood sugar (glucose) after you have not eaten for a while (fasting). You may have this done every 1-3 years. Mammogram. This may be done every 1-2 years. Talk to your health care provider about when you should start having regular mammograms. This may depend on whether you have a family  history of breast cancer. BRCA-related cancer screening. This may be done if you have a family history of breast, ovarian, tubal, or peritoneal cancers. Pelvic exam and Pap test. This may be done every 3 years starting at age 41. Starting at age 56, this may be done every 5 years if you have a Pap test in combination with an HPV test. Bone density scan. This is done to screen for osteoporosis. You may have this scan if you are at high risk for osteoporosis. Discuss your test results, treatment options, and if necessary, the need for more tests with your health care provider. Vaccines  Your health care provider may recommend certain vaccines, such as: Influenza vaccine. This is recommended every year. Tetanus, diphtheria, and acellular pertussis (Tdap, Td) vaccine. You may need a Td booster every 10 years. Zoster vaccine. You may need this after age 51. Pneumococcal 13-valent conjugate (PCV13) vaccine. You may need this if you have certain conditions and were not previously vaccinated. Pneumococcal polysaccharide (PPSV23) vaccine. You may need one or two doses if you smoke cigarettes or if you have certain conditions. Talk to your health care provider about which screenings and vaccines you need and how often you need them. This information is not intended to replace advice given to you by your health care provider. Make sure you discuss any questions you have with your health care provider. Document Released: 04/17/2015 Document Revised: 12/09/2015 Document Reviewed: 01/20/2015 Elsevier Interactive Patient Education  2017 [employer].    Fall Prevention in the Home Falls can cause injuries. They can happen to people of all ages. There are many things you can do to make your home safe and to help prevent falls. What can I do on the outside of my home? Regularly fix the edges of walkways and driveways and fix any cracks. Remove anything that might make you trip as you walk through a door, such  as a raised step or threshold. Trim any bushes or trees on the path to your home. Use bright outdoor lighting. Clear any walking paths of anything that might make someone trip, such as rocks or tools. Regularly check to see if handrails are loose or broken. Make sure that both sides of any steps have handrails. Any raised decks and porches should have guardrails on the edges. Have any leaves, snow, or ice cleared regularly. Use sand or salt on walking paths during winter. Clean up any spills in your garage right away. This includes oil or grease spills. What can I do in the bathroom? Use night lights. Install grab bars by the toilet and in the tub and shower. Do not use towel bars as grab bars. Use non-skid mats or decals in the tub or shower. If you need to sit down in the shower, use a plastic, non-slip stool. Keep the floor dry. Clean up any water that spills on the floor as soon as it happens. Remove soap buildup in the tub or shower regularly. Attach bath mats securely with double-sided non-slip rug tape. Do not have throw  rugs and other things on the floor that can make you trip. What can I do in the bedroom? Use night lights. Make sure that you have a light by your bed that is easy to reach. Do not use any sheets or blankets that are too big for your bed. They should not hang down onto the floor. Have a firm chair that has side arms. You can use this for support while you get dressed. Do not have throw rugs and other things on the floor that can make you trip. What can I do in the kitchen? Clean up any spills right away. Avoid walking on wet floors. Keep items that you use a lot in easy-to-reach places. If you need to reach something above you, use a strong step stool that has a grab bar. Keep electrical cords out of the way. Do not use floor polish or wax that makes floors slippery. If you must use wax, use non-skid floor wax. Do not have throw rugs and other things on the  floor that can make you trip. What can I do with my stairs? Do not leave any items on the stairs. Make sure that there are handrails on both sides of the stairs and use them. Fix handrails that are broken or loose. Make sure that handrails are as long as the stairways. Check any carpeting to make sure that it is firmly attached to the stairs. Fix any carpet that is loose or worn. Avoid having throw rugs at the top or bottom of the stairs. If you do have throw rugs, attach them to the floor with carpet tape. Make sure that you have a light switch at the top of the stairs and the bottom of the stairs. If you do not have them, ask someone to add them for you. What else can I do to help prevent falls? Wear shoes that: Do not have high heels. Have rubber bottoms. Are comfortable and fit you well. Are closed at the toe. Do not wear sandals. If you use a stepladder: Make sure that it is fully opened. Do not climb a closed stepladder. Make sure that both sides of the stepladder are locked into place. Ask someone to hold it for you, if possible. Clearly mark and make sure that you can see: Any grab bars or handrails. First and last steps. Where the edge of each step is. Use tools that help you move around (mobility aids) if they are needed. These include: Canes. Walkers. Scooters. Crutches. Turn on the lights when you go into a dark area. Replace any light bulbs as soon as they burn out. Set up your furniture so you have a clear path. Avoid moving your furniture around. If any of your floors are uneven, fix them. If there are any pets around you, be aware of where they are. Review your medicines with your doctor. Some medicines can make you feel dizzy. This can increase your chance of falling. Ask your doctor what other things that you can do to help prevent falls. This information is not intended to replace advice given to you by your health care provider. Make sure you discuss any questions  you have with your health care provider. Document Released: 01/15/2009 Document Revised: 08/27/2015 Document Reviewed: 04/25/2014 Elsevier Interactive Patient Education  2017 [employer].

## 2022-08-24 NOTE — Progress Notes (Signed)
Subjective:   Brandy [name] is a 57 y.o. female who presents for Medicare Annual (Subsequent) preventive examination.  I connected with  JILLION CARRIE on 08/24/22 by a audio enabled telemedicine application and verified that I am speaking with the correct person using two identifiers.  Patient Location: Home  Provider Location: Home Office  I discussed the limitations of evaluation and management by telemedicine. The patient expressed understanding and agreed to proceed.  Review of Systems     Cardiac Risk Factors include: diabetes mellitus;dyslipidemia;hypertension;smoking/ tobacco exposure;sedentary lifestyle     Objective:    Today's Vitals   08/24/22 2027  Weight: 197 lb (89.4 kg)  Height: 5\' 1"  (1.549 m)   Body mass index is 37.22 kg/m.     08/24/2022    8:32 PM 04/13/2021    3:08 PM 10/24/2016    9:52 AM 02/24/2016    2:51 PM 11/16/2015    3:21 PM 04/29/2015    2:36 PM 03/13/2015    2:55 PM  Advanced Directives  Does Patient Have a Medical Advance Directive? No No No No No No No  Would patient like information on creating a medical advance directive? Yes (MAU/Ambulatory/Procedural Areas - Information given) Yes (Inpatient - patient defers creating a medical advance directive at this time - Information given)   No - patient declined information No - patient declined information     Current Medications (verified) Outpatient Encounter Medications as of 08/24/2022  Medication Sig   albuterol (VENTOLIN HFA) 108 (90 Base) MCG/ACT inhaler Inhale 2 puffs into the lungs every 4 (four) hours as needed for wheezing or shortness of breath.   amLODipine (NORVASC) 5 MG tablet Take 1 tablet (5 mg total) by mouth daily.   aspirin EC 81 MG EC tablet Take 1 tablet (81 mg total) by mouth daily.   Blood Glucose Monitoring Suppl (ONE TOUCH ULTRA 2) w/Device KIT Use as directed three times daily   carvedilol (COREG) 25 MG tablet Take 1 tablet (25 mg total) by mouth 2 (two)  times daily with a meal.   cetirizine (ZYRTEC) 10 MG tablet Take 1 tablet (10 mg total) by mouth daily.   cloNIDine (CATAPRES) 0.1 MG tablet Take 1 tablet (0.1 mg total) by mouth at bedtime as needed for hot flashes.   Continuous Blood Gluc Sensor (FREESTYLE LIBRE 3 SENSOR) MISC Place 1 sensor on the skin every 14 days. Use to check glucose continuously   DULoxetine (CYMBALTA) 60 MG capsule Take 1 capsule (60 mg total) by mouth daily.   Fezolinetant (VEOZAH) 45 MG TABS Take 1 tablet (45 mg total) by mouth daily.   furosemide (LASIX) 20 MG tablet Take 1 tablet (20 mg total) by mouth daily.   gabapentin (NEURONTIN) 300 MG capsule Take 1 capsule (300 mg total) by mouth at bedtime.   glucose blood (ACCU-CHEK GUIDE) test strip Use as instructed three times daily   guaiFENesin (MUCINEX) 600 MG 12 hr tablet Take 1 tablet (600 mg total) by mouth 2 (two) times daily.   haloperidol (HALDOL) 5 MG tablet Take 1 tablet (5 mg total) by mouth 2 (two) times daily.   hydrOXYzine (VISTARIL) 25 MG capsule Take 1 capsule (25 mg total) by mouth 2 (two) times daily as needed.   lamoTRIgine (LAMICTAL) 150 MG tablet Take 1 tablet (150 mg total) by mouth 2 (two) times daily.   Lancets (ONETOUCH DELICA PLUS LANCET33G) MISC use as directed 3 times daily before meals   losartan-hydrochlorothiazide (HYZAAR) 100-25 MG tablet  Take 1 tablet by mouth daily.   Lurasidone HCl 120 MG TABS Take 1 tablet (120 mg total) by mouth daily with breakfast.   meloxicam (MOBIC) 7.5 MG tablet Take 1 tablet (7.5 mg total) by mouth daily.   metFORMIN (GLUCOPHAGE) 500 MG tablet Take 1 tablet (500 mg total) by mouth 2 (two) times daily with a meal.   nitroGLYCERIN (NITRODUR - DOSED IN MG/24 HR) 0.2 mg/hr patch Place 1 patch (0.2 mg total) onto the skin daily. Apply patch near the affected area and change location daily.   pantoprazole (PROTONIX) 40 MG tablet Take 1 tablet (40 mg total) by mouth daily.   potassium chloride SA (KLOR-CON M) 20 MEQ  tablet Take 1 tablet (20 mEq total) by mouth daily.   predniSONE (DELTASONE) 10 MG tablet Take 1 tablet (10 mg total) by mouth daily.   Semaglutide,0.25 or 0.5MG /DOS, (OZEMPIC, 0.25 OR 0.5 MG/DOSE,) 2 MG/3ML SOPN Inject 0.25 mg into the skin once a week.   traZODone (DESYREL) 100 MG tablet Take 1 tablet (100 mg total) by mouth at bedtime.   [DISCONTINUED] fluticasone (FLONASE) 50 MCG/ACT nasal spray Place 1 spray into both nostrils daily.   benzonatate (TESSALON PERLES) 100 MG capsule Take 1 capsule (100 mg total) by mouth 2 (two) times daily as needed for cough. (Patient not taking: Reported on 08/24/2022)   fluticasone (FLONASE) 50 MCG/ACT nasal spray Place 1 spray into both nostrils daily.   [DISCONTINUED] losartan (COZAAR) 100 MG tablet Take 1 tablet (100 mg total) by mouth daily.   No facility-administered encounter medications on file as of 08/24/2022.    Allergies (verified) Azithromycin, Contrast media [iodinated contrast media], and Shellfish allergy   History: Past Medical History:  Diagnosis Date   Allergy    Anxiety    Depression    Diverticulitis    Edema    Gallstones    GERD (gastroesophageal reflux disease)    Hyperlipidemia    Hypertension    Osteoarthritis    Past Surgical History:  Procedure Laterality Date   GALLBLADDER SURGERY     Family History  Problem Relation Age of Onset   Alcohol abuse Mother    Hypertension Father    Diabetes Sister    Schizophrenia Maternal Aunt    Breast cancer Paternal Aunt        61s   Schizophrenia Maternal Grandmother    Coronary artery disease Paternal Grandmother        young age   Diabetes Paternal Grandmother    Colon cancer Neg Hx    Esophageal cancer Neg Hx    Stomach cancer Neg Hx    Rectal cancer Neg Hx    Social History   Socioeconomic History   Marital status: Married    Spouse name: Not on file   Number of children: 3   Years of education: Not on file   Highest education level: Not on file   Occupational History   Occupation: Curator: [employer]  Tobacco Use   Smoking status: Every Day    Packs/day: 0.50    Years: 10.00    Additional pack years: 0.00    Total pack years: 5.00    Types: Cigarettes   Smokeless tobacco: Never  Vaping Use   Vaping Use: Never used  Substance and Sexual Activity   Alcohol use: Yes    Alcohol/week: 3.0 standard drinks of alcohol    Types: 1 Glasses of wine, 2 Cans of beer per week  Drug use: Yes    Types: Marijuana   Sexual activity: Yes    Partners: Male    Birth control/protection: None  Other Topics Concern   Not on file  Social History Narrative   Not on file   Social Determinants of Health   Financial Resource Strain: Low Risk  (08/24/2022)   Overall Financial Resource Strain (CARDIA)    Difficulty of Paying Living Expenses: Not hard at all  Food Insecurity: No Food Insecurity (08/24/2022)   Hunger Vital Sign    Worried About Running Out of Food in the Last Year: Never true    Ran Out of Food in the Last Year: Never true  Transportation Needs: No Transportation Needs (08/24/2022)   PRAPARE - Administrator, Civil Service (Medical): No    Lack of Transportation (Non-Medical): No  Physical Activity: Inactive (08/24/2022)   Exercise Vital Sign    Days of Exercise per Week: 0 days    Minutes of Exercise per Session: 0 min  Stress: No Stress Concern Present (08/24/2022)   [employer] of Occupational Health - Occupational Stress Questionnaire    Feeling of Stress : Not at all  Social Connections: Moderately Isolated (08/24/2022)   Social Connection and Isolation Panel [NHANES]    Frequency of Communication with Friends and Family: More than three times a week    Frequency of Social Gatherings with Friends and Family: Three times a week    Attends Religious Services: Never    Active Member of Clubs or Organizations: No    Attends Engineer, structural: Never    Marital Status: Married     Tobacco Counseling Ready to quit: Not Answered Counseling given: Not Answered   Clinical Intake:  Pre-visit preparation completed: Yes  Pain : No/denies pain     Diabetes: Yes CBG done?: No Did pt. bring in CBG monitor from home?: No  How often do you need to have someone help you when you read instructions, pamphlets, or other written materials from your doctor or pharmacy?: 1 - Never  Diabetic?Yes   Nutrition Risk Assessment:  Has the patient had any N/V/D within the last 2 months?  No  Does the patient have any non-healing wounds?  No  Has the patient had any unintentional weight loss or weight gain?  No   Diabetes:  Is the patient diabetic?  Yes  If diabetic, was a CBG obtained today?  No  Did the patient bring in their glucometer from home?  No  How often do you monitor your CBG's? As needed.   Financial Strains and Diabetes Management:  Are you having any financial strains with the device, your supplies or your medication? No .  Does the patient want to be seen by Chronic Care Management for management of their diabetes?  No  Would the patient like to be referred to a Nutritionist or for Diabetic Management?  No   Diabetic Exams:  Diabetic Eye Exam: Overdue for diabetic eye exam. Pt has been advised about the importance in completing this exam. Patient advised to call and schedule an eye exam. Diabetic Foot Exam: Completed 07/18/22   Interpreter Needed?: No  Information entered by :: Kandis Fantasia LPN   Activities of Daily Living    08/24/2022    8:31 PM  In your present state of health, do you have any difficulty performing the following activities:  Hearing? 0  Vision? 0  Difficulty concentrating or making decisions? 1  Walking or climbing  stairs? 0  Dressing or bathing? 0  Doing errands, shopping? 0  Preparing Food and eating ? N  Using the Toilet? N  In the past six months, have you accidently leaked urine? N  Do you have problems with  loss of bowel control? N  Managing your Medications? N  Managing your Finances? N  Housekeeping or managing your Housekeeping? N    Patient Care Team: Hoy Register, MD as PCP - General (Family Medicine)  Indicate any recent Medical Services you may have received from other than [facility] providers in the past year (date may be approximate).     Assessment:   This is a routine wellness examination for Yassmine.  Hearing/Vision screen Hearing Screening - Comments:: Denies hearing difficulties   Vision Screening - Comments:: No vision problems; will schedule routine eye exam soon    Dietary issues and exercise activities discussed: Current Exercise Habits: The patient does not participate in regular exercise at present   Goals Addressed             This Visit's Progress    Prevent falls        Depression Screen    08/24/2022    8:29 PM 07/18/2022    3:40 PM 07/05/2022    9:51 AM 01/10/2022    1:50 PM 12/22/2021    3:32 PM 09/20/2021    1:03 PM 04/13/2021    3:09 PM  PHQ 2/9 Scores  PHQ - 2 Score 6 6   6  3   PHQ- 9 Score 25 27   26  18      Information is confidential and restricted. Go to Review Flowsheets to unlock data.    Fall Risk    08/24/2022    8:31 PM 07/18/2022    3:40 PM 12/22/2021    3:32 PM 05/18/2021    3:29 PM 04/13/2021    3:09 PM  Fall Risk   Falls in the past year? 1 0 0 0 1  Number falls in past yr: 0 0 0 0 0  Injury with Fall? 0 0 0 0 0  Risk for fall due to : Impaired balance/gait;Impaired mobility No Fall Risks No Fall Risks    Follow up Education provided;Falls prevention discussed;Falls evaluation completed        FALL RISK PREVENTION PERTAINING TO THE HOME:  Any stairs in or around the home? No  If so, are there any without handrails? No  Home free of loose throw rugs in walkways, pet beds, electrical cords, etc? Yes  Adequate lighting in your home to reduce risk of falls? Yes   ASSISTIVE DEVICES UTILIZED TO PREVENT FALLS:  Life alert?  No  Use of a cane, walker or w/c? No  Grab bars in the bathroom? Yes  Shower chair or bench in shower? No  Elevated toilet seat or a handicapped toilet? No   TIMED UP AND GO:  Was the test performed? No . Telephonic visit   Cognitive Function:    04/13/2021    3:18 PM  MMSE - Mini Mental State Exam  Orientation to time 5  Orientation to Place 5  Registration 3  Attention/ Calculation 0  Recall 3  Language- name 2 objects 2  Language- repeat 1  Language- follow 3 step command 3  Language- read & follow direction 1  Write a sentence 1  Copy design 1  Total score 25        08/24/2022    8:31 PM  6CIT Screen  What Year? 0 points  What month? 0 points  What time? 0 points  Count back from 20 0 points  Months in reverse 0 points  Repeat phrase 2 points  Total Score 2 points    Immunizations Immunization History  Administered Date(s) Administered   Influenza,inj,Quad PF,6+ Mos 12/22/2021   Tdap 05/18/2021    TDAP status: Up to date  Pneumococcal vaccine status: Up to date  Covid-19 vaccine status: Information provided on how to obtain vaccines.   Qualifies for Shingles Vaccine? Yes   Zostavax completed No   Shingrix Completed?: No.    Education has been provided regarding the importance of this vaccine. Patient has been advised to call insurance company to determine out of pocket expense if they have not yet received this vaccine. Advised may also receive vaccine at local pharmacy or Health Dept. Verbalized acceptance and understanding.  Screening Tests Health Maintenance  Topic Date Due   COVID-19 Vaccine (1) Never done   OPHTHALMOLOGY EXAM  Never done   HIV Screening  Never done   Diabetic kidney evaluation - Urine ACR  Never done   PAP SMEAR-Modifier  Never done   Zoster Vaccines- Shingrix (1 of 2) Never done   INFLUENZA VACCINE  11/03/2022   HEMOGLOBIN A1C  01/17/2023   MAMMOGRAM  07/17/2023   Diabetic kidney evaluation - eGFR measurement  07/18/2023    FOOT EXAM  07/18/2023   Medicare Annual Wellness (AWV)  08/24/2023   DTaP/Tdap/Td (2 - Td or Tdap) 05/19/2031   COLONOSCOPY (Pts 45-48yrs Insurance coverage will need to be confirmed)  05/29/2031   Hepatitis C Screening  Completed   HPV VACCINES  Aged Out    Health Maintenance  Health Maintenance Due  Topic Date Due   COVID-19 Vaccine (1) Never done   OPHTHALMOLOGY EXAM  Never done   HIV Screening  Never done   Diabetic kidney evaluation - Urine ACR  Never done   PAP SMEAR-Modifier  Never done   Zoster Vaccines- Shingrix (1 of 2) Never done    Colorectal cancer screening: Type of screening: Colonoscopy. Completed 05/28/21. Repeat every 10 years  Mammogram status: Completed 07/16/21. Repeat every year  Lung Cancer Screening: (Low Dose CT Chest recommended if Age 34-80 years, 30 pack-year currently smoking OR have quit w/in 15years.) does not qualify.   Lung Cancer Screening Referral: n/a  Additional Screening:  Hepatitis C Screening: does qualify; Completed 05/05/20  Vision Screening: Recommended annual ophthalmology exams for early detection of glaucoma and other disorders of the eye. Is the patient up to date with their annual eye exam?  No  Who is the provider or what is the name of the office in which the patient attends annual eye exams? none If pt is not established with a provider, would they like to be referred to a provider to establish care? No .   Dental Screening: Recommended annual dental exams for proper oral hygiene  Community Resource Referral / Chronic Care Management: CRR required this visit?  No   CCM required this visit?  No      Plan:     I have personally reviewed and noted the following in the patient's chart:   Medical and social history Use of alcohol, tobacco or illicit drugs  Current medications and supplements including opioid prescriptions. Patient is not currently taking opioid prescriptions. Functional ability and status Nutritional  status Physical activity Advanced directives List of other physicians Hospitalizations, surgeries, and ER visits in previous 12 months Vitals  Screenings to include cognitive, depression, and falls Referrals and appointments  In addition, I have reviewed and discussed with patient certain preventive protocols, quality metrics, and best practice recommendations. A written personalized care plan for preventive services as well as general preventive health recommendations were provided to patient.     Durwin Nora, [place]   3/47/4259   Due to this being a virtual visit, the after visit summary with patients personalized plan was offered to patient via mail or my-chart.  Patient would like to access on my-char  Nurse Notes: See telephone note

## 2022-08-25 ENCOUNTER — Other Ambulatory Visit (HOSPITAL_COMMUNITY): Payer: Self-pay

## 2022-09-05 ENCOUNTER — Other Ambulatory Visit (HOSPITAL_COMMUNITY): Payer: Self-pay

## 2022-09-14 ENCOUNTER — Other Ambulatory Visit (INDEPENDENT_AMBULATORY_CARE_PROVIDER_SITE_OTHER): Payer: PPO

## 2022-09-14 ENCOUNTER — Other Ambulatory Visit (HOSPITAL_COMMUNITY): Payer: Self-pay

## 2022-09-14 ENCOUNTER — Other Ambulatory Visit: Payer: Self-pay

## 2022-09-14 ENCOUNTER — Other Ambulatory Visit: Payer: Self-pay | Admitting: Family Medicine

## 2022-09-14 ENCOUNTER — Other Ambulatory Visit (HOSPITAL_COMMUNITY): Payer: Self-pay | Admitting: Psychiatry

## 2022-09-14 ENCOUNTER — Encounter: Payer: Self-pay | Admitting: Family

## 2022-09-14 ENCOUNTER — Ambulatory Visit (INDEPENDENT_AMBULATORY_CARE_PROVIDER_SITE_OTHER): Payer: PPO | Admitting: Family

## 2022-09-14 DIAGNOSIS — M25511 Pain in right shoulder: Secondary | ICD-10-CM

## 2022-09-14 DIAGNOSIS — G8929 Other chronic pain: Secondary | ICD-10-CM

## 2022-09-14 DIAGNOSIS — F419 Anxiety disorder, unspecified: Secondary | ICD-10-CM

## 2022-09-14 DIAGNOSIS — M25512 Pain in left shoulder: Secondary | ICD-10-CM

## 2022-09-14 DIAGNOSIS — K219 Gastro-esophageal reflux disease without esophagitis: Secondary | ICD-10-CM

## 2022-09-14 DIAGNOSIS — F5101 Primary insomnia: Secondary | ICD-10-CM

## 2022-09-14 DIAGNOSIS — F319 Bipolar disorder, unspecified: Secondary | ICD-10-CM

## 2022-09-14 MED ORDER — LIDOCAINE HCL 1 % IJ SOLN
5.0000 mL | INTRAMUSCULAR | Status: AC | PRN
  Administered 2022-09-14: 5 mL

## 2022-09-14 MED ORDER — METHYLPREDNISOLONE ACETATE 40 MG/ML IJ SUSP
40.0000 mg | INTRAMUSCULAR | Status: AC | PRN
  Administered 2022-09-14: 40 mg via INTRA_ARTICULAR

## 2022-09-14 MED ORDER — PANTOPRAZOLE SODIUM 40 MG PO TBEC
40.0000 mg | DELAYED_RELEASE_TABLET | Freq: Every day | ORAL | 0 refills | Status: DC
  Filled 2022-09-14: qty 90, 90d supply, fill #0

## 2022-09-14 NOTE — Progress Notes (Signed)
Office Visit Note   Patient: Brandy [name]           Date of Birth: Aug 18, 1965           MRN: [id number] Visit Date: 09/14/2022              Requested by: Hoy Register, MD [address] [place],  [place] [address] PCP: Hoy Register, MD  Chief Complaint  Patient presents with   Left Shoulder - Pain   Right Shoulder - Pain      HPI: This is a 57 year old woman who presents today complaining of bilateral shoulder pain right worse than left the right shoulder has bothered her for many years now she has previously had an MRI of the shoulder has been having gradually worsening pain this wakes her at night any gravity or pulling of the shoulder is very painful she has decreased range of motion difficulty reaching above head behind back no associated injury of either shoulder is right-hand dominant the pain is similar on the left his the left has only been bothering her for the last few months no numbness no tingling no neck pain  Assessment & Plan: Visit Diagnoses:  1. Chronic pain of both shoulders     Plan: Depo-Medrol injection bilateral shoulders rotator cuff tendinopathy bilaterally she will follow-up in the office in 3 weeks if no improvement with Depo-Medrol injection consider MRI bilateral shoulders versus physical therapy  Follow-Up Instructions: No follow-ups on file.   Right Shoulder Exam   Tenderness  The patient is experiencing tenderness in the biceps tendon.  Range of Motion  The patient has normal right shoulder ROM.  Tests  Impingement: positive Drop arm: negative  Other  Pulse: present   Left Shoulder Exam   Tenderness  The patient is experiencing tenderness in the biceps tendon.  Range of Motion  The patient has normal left shoulder ROM.  Tests  Impingement: positive Drop arm: negative  Other  Erythema: absent Pulse: present       Patient is alert, oriented, no adenopathy, well-dressed, normal affect, normal  respiratory effort.   Imaging: No results found. No images are attached to the encounter.  Labs: Lab Results  Component Value Date   HGBA1C 7.0 07/18/2022   HGBA1C 8.6 (A) 12/22/2021   HGBA1C 5.70 04/29/2015   REPTSTATUS 04/27/2013 FINAL 04/25/2013   GRAMSTAIN  11/05/2012    MODERATE WBC PRESENT, PREDOMINANTLY PMN NO SQUAMOUS EPITHELIAL CELLS SEEN MODERATE GRAM POSITIVE COCCI IN PAIRS Performed at [facility]   CULT  04/25/2013    No Beta Hemolytic Streptococci Isolated Performed at [facility]     Lab Results  Component Value Date   ALBUMIN 4.6 05/18/2021   ALBUMIN 4.8 05/05/2020   ALBUMIN 4.3 10/02/2017    No results found for: "MG" No results found for: "VD25OH"  No results found for: "PREALBUMIN"    Latest Ref Rng & Units 10/08/2015    8:15 AM 02/05/2015    4:00 AM 02/03/2015    4:20 AM  CBC EXTENDED  WBC 4.0 - 10.5 K/uL 5.7  5.6  5.8   RBC 3.87 - 5.11 MIL/uL 4.27  4.70  4.86   Hemoglobin 12.0 - 15.0 g/dL 19.1  47.8  29.5   HCT 36.0 - 46.0 % 37.6  40.6  41.7   Platelets 150 - 400 K/uL 220  217  223   NEUT# 1.7 - 7.7 K/uL 3.4     Lymph# 0.7 -  4.0 K/uL 1.8        There is no height or weight on file to calculate BMI.  Orders:  Orders Placed This Encounter  Procedures   XR Shoulder Right   XR Shoulder Left   No orders of the defined types were placed in this encounter.    Procedures: Large Joint Inj: bilateral subacromial bursa on 09/14/2022 2:39 PM Indications: pain Details: 22 G 1.5 in needle Medications (Right): 5 mL lidocaine 1 %; 40 mg methylPREDNISolone acetate 40 MG/ML Medications (Left): 5 mL lidocaine 1 %; 40 mg methylPREDNISolone acetate 40 MG/ML Consent was given by the patient.      Clinical Data: No additional findings.  ROS:  All other systems negative, except as noted in the HPI. Review of Systems  Objective: Vital Signs: LMP 11/02/2020   Specialty Comments:  No specialty comments available.  PMFS  History: Patient Active Problem List   Diagnosis Date Noted   Type 2 diabetes mellitus (HCC) 12/22/2021   Obesity (BMI 35.0-39.9 without comorbidity) 10/24/2016   Bipolar disorder (HCC) 04/29/2015   Insomnia 03/13/2015   Major depression, recurrent (HCC) 03/10/2015   Generalized anxiety disorder 03/10/2015   Dizziness and giddiness 02/20/2015   GERD (gastroesophageal reflux disease) 02/20/2015   Abnormal CXR    Dyspnea 02/02/2015   Hypoxia 02/02/2015   Acute pulmonary edema (HCC) 02/02/2015   Hypertensive urgency 02/02/2015   Hypertensive crisis    Nausea with vomiting 06/09/2011   HTN (hypertension) 12/02/2010   Diverticulitis of sigmoid colon 12/02/2010   Tobacco dependence 12/02/2010   Past Medical History:  Diagnosis Date   Allergy    Anxiety    Depression    Diverticulitis    Edema    Gallstones    GERD (gastroesophageal reflux disease)    Hyperlipidemia    Hypertension    Osteoarthritis     Family History  Problem Relation Age of Onset   Alcohol abuse Mother    Hypertension Father    Diabetes Sister    Schizophrenia Maternal Aunt    Breast cancer Paternal Aunt        53s   Schizophrenia Maternal Grandmother    Coronary artery disease Paternal Grandmother        young age   Diabetes Paternal Grandmother    Colon cancer Neg Hx    Esophageal cancer Neg Hx    Stomach cancer Neg Hx    Rectal cancer Neg Hx     Past Surgical History:  Procedure Laterality Date   GALLBLADDER SURGERY     Social History   Occupational History   Occupation: Curator: [employer]  Tobacco Use   Smoking status: Every Day    Packs/day: 0.50    Years: 10.00    Additional pack years: 0.00    Total pack years: 5.00    Types: Cigarettes   Smokeless tobacco: Never  Vaping Use   Vaping Use: Never used  Substance and Sexual Activity   Alcohol use: Yes    Alcohol/week: 3.0 standard drinks of alcohol    Types: 1 Glasses of wine, 2 Cans of beer per week   Drug  use: Yes    Types: Marijuana   Sexual activity: Yes    Partners: Male    Birth control/protection: None

## 2022-09-15 ENCOUNTER — Encounter (HOSPITAL_COMMUNITY): Payer: Self-pay | Admitting: Psychiatry

## 2022-09-15 ENCOUNTER — Telehealth (HOSPITAL_BASED_OUTPATIENT_CLINIC_OR_DEPARTMENT_OTHER): Payer: PPO | Admitting: Psychiatry

## 2022-09-15 ENCOUNTER — Other Ambulatory Visit: Payer: Self-pay

## 2022-09-15 ENCOUNTER — Other Ambulatory Visit (HOSPITAL_COMMUNITY): Payer: Self-pay

## 2022-09-15 VITALS — Wt 197.0 lb

## 2022-09-15 DIAGNOSIS — F319 Bipolar disorder, unspecified: Secondary | ICD-10-CM | POA: Diagnosis not present

## 2022-09-15 DIAGNOSIS — F419 Anxiety disorder, unspecified: Secondary | ICD-10-CM | POA: Diagnosis not present

## 2022-09-15 DIAGNOSIS — F5101 Primary insomnia: Secondary | ICD-10-CM | POA: Diagnosis not present

## 2022-09-15 MED ORDER — LURASIDONE HCL 120 MG PO TABS
120.0000 mg | ORAL_TABLET | Freq: Every day | ORAL | 0 refills | Status: DC
  Filled 2022-09-15 – 2022-12-19 (×2): qty 30, 30d supply, fill #0
  Filled 2022-12-19: qty 60, 60d supply, fill #0

## 2022-09-15 MED ORDER — LAMOTRIGINE 150 MG PO TABS
150.0000 mg | ORAL_TABLET | Freq: Two times a day (BID) | ORAL | 0 refills | Status: DC
  Filled 2022-09-15: qty 150, 75d supply, fill #0
  Filled 2022-09-15: qty 30, 15d supply, fill #0

## 2022-09-15 MED ORDER — TRAZODONE HCL 100 MG PO TABS
100.0000 mg | ORAL_TABLET | Freq: Every day | ORAL | 2 refills | Status: DC
  Filled 2022-09-15: qty 30, 30d supply, fill #0
  Filled 2022-12-19: qty 30, 30d supply, fill #1

## 2022-09-15 MED ORDER — DULOXETINE HCL 60 MG PO CPEP
60.0000 mg | ORAL_CAPSULE | Freq: Every day | ORAL | 0 refills | Status: DC
  Filled 2022-09-15: qty 90, 90d supply, fill #0

## 2022-09-15 MED ORDER — HYDROXYZINE PAMOATE 25 MG PO CAPS
25.0000 mg | ORAL_CAPSULE | Freq: Two times a day (BID) | ORAL | 2 refills | Status: DC | PRN
  Filled 2022-09-15: qty 60, 30d supply, fill #0

## 2022-09-15 MED ORDER — HALOPERIDOL 5 MG PO TABS
5.0000 mg | ORAL_TABLET | Freq: Two times a day (BID) | ORAL | 2 refills | Status: DC
  Filled 2022-09-15: qty 60, 30d supply, fill #0
  Filled 2022-12-19: qty 52, 26d supply, fill #1
  Filled 2022-12-19: qty 8, 4d supply, fill #1

## 2022-09-15 NOTE — Progress Notes (Signed)
[facility] MD Virtual Progress Note   Patient Location: Home Provider Location: Office  I connect with patient by video and verified that I am speaking with correct person by using two identifiers. I discussed the limitations of evaluation and management by telemedicine and the availability of in person appointments. I also discussed with the patient that there may be a patient responsible charge related to this service. The patient expressed understanding and agreed to proceed.  Brandy [name] [id number] y.o.  09/15/2022 2:45 PM  History of Present Illness:  Patient is evaluated by video session.  She had missed last appointment because her phone was not working.  She struggled with chronic pain and recently received injection in her shoulder.  She has difficulty in both shoulder and lately not sleeping well.  She also missed the appointment with her therapist.  She reported having crying spells and a struggle with fatigue.  She is taking Ozempic and having nausea but her last hemoglobin A1c is improved from the past.  She has chronic depression, crying, irritability.  She is taking multiple medication but is still complaining of paranoia and irritability and mood swings.  We had tried multiple medication in the past but either she had side effects or insurance did not approve.  In the past she was also not consistent with the medication but lately she is trying to take the medication on time as she noticed without medicine her condition got worse.  She is working but not sure how long she can work because of the chronic pain.  Patient told she has to work so she can keep the insurance.  On the last visit we optimized Haldol and hydroxyzine which she noticed some of the anxiety but now lately having shoulder pain cause worsening of symptoms.  She denies any homicidal thoughts.  Her appetite is okay.  She has nausea and she is trying to lose weight.  Her sugar is better.   Recently she had a physical and blood work.  Past Psychiatric History: H/O inpatient in October 08, 1995 after mother died.  Seen at [facility] from 07-06-19902000/07/06,   She saw Dr. Jarold Motto and did IOP in 10-08-2015.  No h/o suicidal attempt.  H/O paranoia, depression and paranoia. Had tried Zoloft, Wellbutrin, Rexulti, Klonopin, Abilify, Lexapro, Risperdal, temazepam, Lunesta and Seroquel.    Outpatient Encounter Medications as of 09/15/2022  Medication Sig   albuterol (VENTOLIN HFA) 108 (90 Base) MCG/ACT inhaler Inhale 2 puffs into the lungs every 4 (four) hours as needed for wheezing or shortness of breath.   amLODipine (NORVASC) 5 MG tablet Take 1 tablet (5 mg total) by mouth daily.   aspirin EC 81 MG EC tablet Take 1 tablet (81 mg total) by mouth daily.   benzonatate (TESSALON PERLES) 100 MG capsule Take 1 capsule (100 mg total) by mouth 2 (two) times daily as needed for cough. (Patient not taking: Reported on 08/24/2022)   Blood Glucose Monitoring Suppl (ONE TOUCH ULTRA 2) w/Device KIT Use as directed three times daily   carvedilol (COREG) 25 MG tablet Take 1 tablet (25 mg total) by mouth 2 (two) times daily with a meal.   cetirizine (ZYRTEC) 10 MG tablet Take 1 tablet (10 mg total) by mouth daily.   cloNIDine (CATAPRES) 0.1 MG tablet Take 1 tablet (0.1 mg total) by mouth at bedtime as needed for hot flashes.   Continuous Glucose Sensor (FREESTYLE LIBRE 3 SENSOR) MISC Place 1 sensor on the skin every 14 days. Use  to check glucose continuously   DULoxetine (CYMBALTA) 60 MG capsule Take 1 capsule (60 mg total) by mouth daily.   Fezolinetant (VEOZAH) 45 MG TABS Take 1 tablet (45 mg total) by mouth daily.   fluticasone (FLONASE) 50 MCG/ACT nasal spray Place 1 spray into both nostrils daily.   furosemide (LASIX) 20 MG tablet Take 1 tablet (20 mg total) by mouth daily.   gabapentin (NEURONTIN) 300 MG capsule Take 1 capsule (300 mg total) by mouth at bedtime.   glucose blood (ACCU-CHEK GUIDE) test strip Use as instructed  three times daily   guaiFENesin (MUCINEX) 600 MG 12 hr tablet Take 1 tablet (600 mg total) by mouth 2 (two) times daily.   haloperidol (HALDOL) 5 MG tablet Take 1 tablet (5 mg total) by mouth 2 (two) times daily.   hydrOXYzine (VISTARIL) 25 MG capsule Take 1 capsule (25 mg total) by mouth 2 (two) times daily as needed.   lamoTRIgine (LAMICTAL) 150 MG tablet Take 1 tablet (150 mg total) by mouth 2 (two) times daily.   Lancets (ONETOUCH DELICA PLUS LANCET33G) MISC use as directed 3 times daily before meals   losartan-hydrochlorothiazide (HYZAAR) 100-25 MG tablet Take 1 tablet by mouth daily.   Lurasidone HCl 120 MG TABS Take 1 tablet (120 mg total) by mouth daily with breakfast.   meloxicam (MOBIC) 7.5 MG tablet Take 1 tablet (7.5 mg total) by mouth daily.   metFORMIN (GLUCOPHAGE) 500 MG tablet Take 1 tablet (500 mg total) by mouth 2 (two) times daily with a meal.   nitroGLYCERIN (NITRODUR - DOSED IN MG/24 HR) 0.2 mg/hr patch Place 1 patch (0.2 mg total) onto the skin daily. Apply patch near the affected area and change location daily.   pantoprazole (PROTONIX) 40 MG tablet Take 1 tablet (40 mg total) by mouth daily.   potassium chloride SA (KLOR-CON M) 20 MEQ tablet Take 1 tablet (20 mEq total) by mouth daily.   predniSONE (DELTASONE) 10 MG tablet Take 1 tablet (10 mg total) by mouth daily.   Semaglutide,0.25 or 0.5MG /DOS, (OZEMPIC, 0.25 OR 0.5 MG/DOSE,) 2 MG/3ML SOPN Inject 0.25 mg into the skin once a week.   traZODone (DESYREL) 100 MG tablet Take 1 tablet (100 mg total) by mouth at bedtime.   [DISCONTINUED] losartan (COZAAR) 100 MG tablet Take 1 tablet (100 mg total) by mouth daily.   No facility-administered encounter medications on file as of 09/15/2022.    Recent Results (from the past 2160 hour(s))  POCT glucose (manual entry)     Status: Abnormal   Collection Time: 07/18/22  3:41 PM  Result Value Ref Range   POC Glucose 182 (A) 70 - 99 mg/dl  POCT glycosylated hemoglobin (Hb A1C)      Status: None   Collection Time: 07/18/22  3:43 PM  Result Value Ref Range   Hemoglobin A1C     HbA1c POC (<> result, manual entry)     HbA1c, POC (prediabetic range)     HbA1c, POC (controlled diabetic range) 7.0 0.0 - 7.0 %  Basic Metabolic Panel     Status: Abnormal   Collection Time: 07/18/22  4:44 PM  Result Value Ref Range   Glucose 137 (H) 70 - 99 mg/dL   BUN 14 6 - 24 mg/dL   Creatinine, Ser 1.61 0.57 - 1.00 mg/dL   eGFR 69 >09 UE/AVW/0.98   BUN/Creatinine Ratio 15 9 - 23   Sodium 143 134 - 144 mmol/L   Potassium 4.1 3.5 - 5.2 mmol/L  Chloride 101 96 - 106 mmol/L   CO2 27 20 - 29 mmol/L   Calcium 10.2 8.7 - 10.2 mg/dL     Psychiatric Specialty Exam: Physical Exam  Review of Systems  Gastrointestinal:  Positive for nausea.  Musculoskeletal:        Right shoulder pain  Neurological:  Positive for numbness.  Psychiatric/Behavioral:  Positive for decreased concentration, dysphoric mood and sleep disturbance. The patient is nervous/anxious.     Weight 197 lb (89.4 kg), last menstrual period 11/02/2020.There is no height or weight on file to calculate BMI.  General Appearance: Casual  Eye Contact:  Fair  Speech:  Slow  Volume:  Decreased  Mood:  Anxious, Depressed, and Dysphoric  Affect:  Congruent  Thought Process:  Goal Directed  Orientation:  Full (Time, Place, and Person)  Thought Content:  Rumination  Suicidal Thoughts:  No  Homicidal Thoughts:  No  Memory:  Immediate;   Fair Recent;   Fair Remote;   Fair  Judgement:  Fair  Insight:  Shallow  Psychomotor Activity:  Decreased  Concentration:  Concentration: Fair and Attention Span: Fair  Recall:  [employer] of Knowledge:  Fair  Language:  Good  Akathisia:  No  Handed:  Right  AIMS (if indicated):     Assets:  Communication Skills Desire for Improvement Housing Transportation  ADL's:  Intact  Cognition:  WNL  Sleep:  fair     Assessment/Plan: Bipolar I disorder (HCC) - Plan: haloperidol (HALDOL)  5 MG tablet, Lurasidone HCl 120 MG TABS, lamoTRIgine (LAMICTAL) 150 MG tablet  Anxiety - Plan: DULoxetine (CYMBALTA) 60 MG capsule, hydrOXYzine (VISTARIL) 25 MG capsule  Primary insomnia - Plan: hydrOXYzine (VISTARIL) 25 MG capsule, traZODone (DESYREL) 100 MG tablet  I reviewed blood work results, noncompliant with medications and follow-ups.  Discussed chronic health issues.  Patient is taking multiple medication but is still have chronic symptoms.  I encourage keep the therapy appointment.  We discussed reducing the medication but patient afraid and wants to keep the current medication as at least her symptoms are stable and manageable.  She has no tremors or shakes.  She liked higher dose of Haldol and hydroxyzine.  We also reduce trazodone dose because of Haldol optimization.  Patient does not want to change the dosage of the medication.  Continue Cymbalta 60 mg daily, trazodone 100 mg at bedtime, Haldol 5 mg 2 times a day, hydroxyzine 25 mg twice a day, Latuda 120 mg daily in the morning and Lamictal 150 mg twice a day.  I emphasized that if symptoms remain unchanged then we may need to cut down the medication and she need to focus on therapy.  I have extensive discussion about the medication side effects benefits and long-term effects.  Follow-up in 3 months.  Encouraged to keep appointment with [facility].   Follow Up Instructions:     I discussed the assessment and treatment plan with the patient. The patient was provided an opportunity to ask questions and all were answered. The patient agreed with the plan and demonstrated an understanding of the instructions.   The patient was advised to call back or seek an in-person evaluation if the symptoms worsen or if the condition fails to improve as anticipated.    Collaboration of Care: Other provider involved in patient's care AEB notes are available in epic to review.  Patient/Guardian was advised Release of Information must be obtained prior  to any record release in order to collaborate their care with  an outside provider. Patient/Guardian was advised if they have not already done so to contact the registration department to sign all necessary forms in order for [name] to release information regarding their care.   Consent: Patient/Guardian gives verbal consent for treatment and assignment of benefits for services provided during this visit. Patient/Guardian expressed understanding and agreed to proceed.     I provided 24 minutes of non face to face time during this encounter.  Note: This document was prepared by [employer] voice dictation technology and any errors that results from this process are unintentional.    Cleotis Nipper, MD 09/15/2022

## 2022-09-16 ENCOUNTER — Other Ambulatory Visit (HOSPITAL_COMMUNITY): Payer: Self-pay

## 2022-09-16 ENCOUNTER — Other Ambulatory Visit: Payer: Self-pay

## 2022-09-23 ENCOUNTER — Other Ambulatory Visit (HOSPITAL_COMMUNITY): Payer: Self-pay

## 2022-10-05 ENCOUNTER — Other Ambulatory Visit (HOSPITAL_COMMUNITY): Payer: Self-pay

## 2022-10-10 ENCOUNTER — Other Ambulatory Visit (HOSPITAL_COMMUNITY): Payer: Self-pay

## 2022-10-17 ENCOUNTER — Other Ambulatory Visit (HOSPITAL_COMMUNITY): Payer: Self-pay

## 2022-10-31 ENCOUNTER — Encounter: Payer: Self-pay | Admitting: Pharmacist

## 2022-10-31 DIAGNOSIS — Z9189 Other specified personal risk factors, not elsewhere classified: Secondary | ICD-10-CM

## 2022-10-31 NOTE — Progress Notes (Unsigned)
[facility]) [facility] Quality Pharmacy Team Statin Quality Measure Assessment  10/31/2022  Brandy [name] June 19, 1965 [id number]  Per review of chart and payor information, patient has a diagnosis of diabetes but is not currently filling a statin prescription.  This places patient into the Statin Use In Patients with Diabetes (SUPD) measure for CMS.    Patient was previously on Atorvastatin (2026) but not since then.  No documentation of statin intolerance.  She has an upcoming appointment 11/01/2022.  If deemed therapeutically appropriate, statin assessment can be completed If patient has experienced statin intolerance, a statin exclusion code could be associated with the visit.  The ASCVD Risk score (Arnett DK, et al., 2019) failed to calculate for the following reasons:   The systolic blood pressure is missing 05/05/2020     Component Value Date/Time   CHOL 193 05/05/2020 1053   TRIG 215 (H) 05/05/2020 1053   HDL 49 05/05/2020 1053   CHOLHDL 3.9 05/05/2020 1053   CHOLHDL 3.1 02/03/2015 0420   VLDL 24 02/03/2015 0420   LDLCALC 107 (H) 05/05/2020 1053    Please consider ONE of the following recommendations:  Initiate high intensity statin Atorvastatin 40 mg once daily, #90, 3 refills   Rosuvastatin 20 mg once daily, #90, 3 refills    Initiate moderate intensity          statin with reduced frequency if prior          statin intolerance 1x weekly, #13, 3 refills   2x weekly, #26, 3 refills   3x weekly, #39, 3 refills    Code for past statin intolerance or  other exclusions (required annually)  Provider Requirements: Associate code during an office visit or telehealth encounter  Drug Induced Myopathy G72.0   Myopathy, unspecified G72.9   Myositis, unspecified M60.9   Rhabdomyolysis M62.82   Cirrhosis of liver K74.69   Prediabetes R73.03   PCOS E28.2   Plan: Route note to Patient's Provider prior to the upcoming appointment.  Beecher Mcardle, PharmD,  BCACP [facility] Clinical Pharmacist [phone]

## 2022-11-01 ENCOUNTER — Ambulatory Visit: Payer: PPO | Admitting: Family Medicine

## 2022-11-03 ENCOUNTER — Other Ambulatory Visit (HOSPITAL_COMMUNITY): Payer: Self-pay

## 2022-11-03 ENCOUNTER — Ambulatory Visit: Payer: PPO | Admitting: Physician Assistant

## 2022-11-03 ENCOUNTER — Encounter: Payer: Self-pay | Admitting: Physician Assistant

## 2022-11-03 DIAGNOSIS — M25511 Pain in right shoulder: Secondary | ICD-10-CM

## 2022-11-03 DIAGNOSIS — M25512 Pain in left shoulder: Secondary | ICD-10-CM | POA: Diagnosis not present

## 2022-11-03 DIAGNOSIS — G8929 Other chronic pain: Secondary | ICD-10-CM | POA: Diagnosis not present

## 2022-11-03 MED ORDER — ACETAMINOPHEN-CODEINE 300-30 MG PO TABS
1.0000 | ORAL_TABLET | ORAL | 0 refills | Status: AC | PRN
  Filled 2022-11-03: qty 30, 4d supply, fill #0

## 2022-11-03 NOTE — Progress Notes (Signed)
Office Visit Note   Patient: Brandy [name]           Date of Birth: [birth year]           MRN: [id number] Visit Date: 11/03/2022              Requested by: Hoy Register, MD [address] [place] [address] [place],  [place] [address] PCP: Hoy Register, MD  Chief Complaint  Patient presents with   Right Shoulder - Pain      HPI: The patient is a pleasant 57 year old woman who saw Denny Peon a month or 2 ago with bilateral shoulder pain.  She had no particular injury but this is overall been going on for 6 months.  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  Both of her shoulders were injected.  While her left shoulder got better she is having increasing pain and loss of function in the right shoulder.  She is right-handed.  She cannot sleep at night and only got about a weeks relief with the injections.  Rates her pain is moderate to severe at night.  No fever or chills.  She would like to get an MRI as was suggested in her last visit  Assessment & Plan:  Visit Diagnoses: Right shoulder pain  Plan: I think she does have some rotator cuff tendinitis may even have some adhesive capsulitis of the right shoulder.  This has not been going on for 6 months despite steroid injection rest.  Have recommended that she go forward with the MRI.  She is also asking for pain medicine I will give her a very small amount of Tylenol 3.  She is requesting to follow-up with  Dr. Roda Shutters afterwards has he has seen her husband  Follow-Up Instructions: With Dr.Xu after MRI  Ortho Exam  Patient is alert, oriented, no adenopathy, well-dressed, normal affect, normal respiratory effort. Examination of her right shoulder no step-off no redness no erythema.  She has a very painful forward elevation and internal rotation behind her back she does also have some stiffness with this.  No neck point pain she is neurovascular intact has difficulty with empty can testing and  speeds testing  Imaging: No results found. No images are attached to the encounter.  Labs: Lab Results  Component Value Date   HGBA1C 7.0 07/18/2022   HGBA1C 8.6 (A) 12/22/2021   HGBA1C 5.70 04/29/2015   REPTSTATUS 04/27/2013 FINAL 04/25/2013   GRAMSTAIN  11/05/2012    MODERATE WBC PRESENT, PREDOMINANTLY PMN NO SQUAMOUS EPITHELIAL CELLS SEEN MODERATE GRAM POSITIVE COCCI IN PAIRS Performed at [facility]   CULT  04/25/2013    No Beta Hemolytic Streptococci Isolated Performed at [facility]     Lab Results  Component Value Date   ALBUMIN 4.6 05/18/2021   ALBUMIN 4.8 05/05/2020   ALBUMIN 4.3 10/02/2017    No results found for: "MG" No results found for: "VD25OH"  No results found for: "PREALBUMIN"    Latest Ref Rng & Units 10/08/2015    8:15 AM 02/05/2015    4:00 AM 02/03/2015    4:20 AM  CBC EXTENDED  WBC 4.0 - 10.5 K/uL 5.7  5.6  5.8   RBC 3.87 - 5.11 MIL/uL 4.27  4.70  4.86   Hemoglobin 12.0 - 15.0 g/dL 65.7  84.6  96.2   HCT 36.0 - 46.0 % 37.6  40.6  41.7   Platelets 150 - 400 K/uL  220  217  223   NEUT# 1.7 - 7.7 K/uL 3.4     Lymph# 0.7 - 4.0 K/uL 1.8        There is no height or weight on file to calculate BMI.  Orders:  No orders of the defined types were placed in this encounter.  No orders of the defined types were placed in this encounter.    Procedures: No procedures performed  Clinical Data: No additional findings.  ROS:  All other systems negative, except as noted in the HPI. Review of Systems  Objective: Vital Signs: LMP 11/02/2020   Specialty Comments:  No specialty comments available.  PMFS History: Patient Active Problem List   Diagnosis Date Noted   Type 2 diabetes mellitus (HCC) 12/22/2021   Obesity (BMI 35.0-39.9 without comorbidity) 10/24/2016   Bipolar disorder (HCC) 04/29/2015   Insomnia 03/13/2015   Major depression, recurrent (HCC) 03/10/2015   Generalized anxiety disorder 03/10/2015   Dizziness  and giddiness 02/20/2015   GERD (gastroesophageal reflux disease) 02/20/2015   Abnormal CXR    Dyspnea 02/02/2015   Hypoxia 02/02/2015   Acute pulmonary edema (HCC) 02/02/2015   Hypertensive urgency 02/02/2015   Hypertensive crisis    Nausea with vomiting 06/09/2011   HTN (hypertension) 12/02/2010   Diverticulitis of sigmoid colon 12/02/2010   Tobacco dependence 12/02/2010   Past Medical History:  Diagnosis Date   Allergy    Anxiety    Depression    Diverticulitis    Edema    Gallstones    GERD (gastroesophageal reflux disease)    Hyperlipidemia    Hypertension    Osteoarthritis     Family History  Problem Relation Age of Onset   Alcohol abuse Mother    Hypertension Father    Diabetes Sister    Schizophrenia Maternal Aunt    Breast cancer Paternal Aunt        7s   Schizophrenia Maternal Grandmother    Coronary artery disease Paternal Grandmother        young age   Diabetes Paternal Grandmother    Colon cancer Neg Hx    Esophageal cancer Neg Hx    Stomach cancer Neg Hx    Rectal cancer Neg Hx     Past Surgical History:  Procedure Laterality Date   GALLBLADDER SURGERY     Social History   Occupational History   Occupation: Curator: [employer]  Tobacco Use   Smoking status: Every Day    Current packs/day: 0.50    Average packs/day: 0.5 packs/day for 10.0 years (5.0 ttl pk-yrs)    Types: Cigarettes   Smokeless tobacco: Never  Vaping Use   Vaping status: Never Used  Substance and Sexual Activity   Alcohol use: Yes    Alcohol/week: 3.0 standard drinks of alcohol    Types: 1 Glasses of wine, 2 Cans of beer per week   Drug use: Yes    Types: Marijuana   Sexual activity: Yes    Partners: Male    Birth control/protection: None

## 2022-11-23 ENCOUNTER — Telehealth: Payer: Self-pay | Admitting: Internal Medicine

## 2022-11-23 ENCOUNTER — Encounter: Payer: Self-pay | Admitting: Pharmacist

## 2022-11-23 ENCOUNTER — Other Ambulatory Visit: Payer: Self-pay | Admitting: Pharmacist

## 2022-11-23 ENCOUNTER — Other Ambulatory Visit (HOSPITAL_COMMUNITY): Payer: Self-pay

## 2022-11-23 DIAGNOSIS — I152 Hypertension secondary to endocrine disorders: Secondary | ICD-10-CM

## 2022-11-23 DIAGNOSIS — E1169 Type 2 diabetes mellitus with other specified complication: Secondary | ICD-10-CM

## 2022-11-23 DIAGNOSIS — Z79899 Other long term (current) drug therapy: Secondary | ICD-10-CM

## 2022-11-23 MED ORDER — ROSUVASTATIN CALCIUM 10 MG PO TABS
10.0000 mg | ORAL_TABLET | Freq: Every day | ORAL | 1 refills | Status: DC
  Filled 2022-11-23: qty 90, 90d supply, fill #0

## 2022-11-23 MED ORDER — LOSARTAN POTASSIUM-HCTZ 100-25 MG PO TABS
1.0000 | ORAL_TABLET | Freq: Every day | ORAL | 1 refills | Status: DC
Start: 2022-11-23 — End: 2023-01-11
  Filled 2022-11-23: qty 90, 90d supply, fill #0

## 2022-11-23 NOTE — Telephone Encounter (Signed)
For some reason I received the MyChart message again that you sent to Dr. Alvis Lemmings on this patient regarding starting her on rosuvastatin.  Please make sure patient is aware that she needs to come to the lab to have blood drawn to check liver function tests as recommended when starting statin therapy.  The last time she had liver function test was in February 2023.

## 2022-11-23 NOTE — Progress Notes (Signed)
Pharmacy Quality Measure Review  This patient is appearing on a report for being at risk of failing the adherence measure for diabetes medications this calendar year.   Medication: metformin Last fill date: 09/15/22 for 90 day supply. Fill prior to that was 05/23/22 for a 90-day.  She was behind on metformin fills earlier in the year but now seems up-to-date. Reminder sent for refills to be sent next month. Also sent MyChart message with a reminder.   Of note, she also filled losartan-hydrochlorothiazide 10/10/2022 for a 60-day supply. Therefore, refills for this will be due next month. Rxn sent.   Finally, she was diagnosed on DM this year and is not on a statin. I called and left a VM. I was going to discuss starting this for her. I will send in a rxn for rosuvastatin and summarize why in her MyChart message.   Butch Penny, PharmD, [place]y [place], CPP Clinical Pharmacist [facility] [phone]

## 2022-11-24 ENCOUNTER — Other Ambulatory Visit (HOSPITAL_COMMUNITY): Payer: Self-pay

## 2022-11-24 ENCOUNTER — Telehealth: Payer: Self-pay | Admitting: Physician Assistant

## 2022-11-24 NOTE — Telephone Encounter (Signed)
Per last appt with [place] pt needs MRI review scheduled with Roda Shutters called pt LVM to CB and schedule

## 2022-12-01 ENCOUNTER — Ambulatory Visit
Admission: RE | Admit: 2022-12-01 | Discharge: 2022-12-01 | Disposition: A | Discharge status: Home / Self Care | Payer: PPO | Source: Ambulatory Visit | Attending: Physician Assistant | Admitting: Physician Assistant

## 2022-12-01 DIAGNOSIS — M7581 Other shoulder lesions, right shoulder: Secondary | ICD-10-CM | POA: Diagnosis not present

## 2022-12-01 DIAGNOSIS — G8929 Other chronic pain: Secondary | ICD-10-CM

## 2022-12-01 DIAGNOSIS — M25511 Pain in right shoulder: Secondary | ICD-10-CM | POA: Diagnosis not present

## 2022-12-07 ENCOUNTER — Other Ambulatory Visit (HOSPITAL_COMMUNITY): Payer: Self-pay

## 2022-12-19 ENCOUNTER — Other Ambulatory Visit (HOSPITAL_COMMUNITY): Payer: Self-pay | Admitting: Psychiatry

## 2022-12-19 ENCOUNTER — Encounter (HOSPITAL_COMMUNITY): Payer: Self-pay

## 2022-12-19 ENCOUNTER — Other Ambulatory Visit (HOSPITAL_COMMUNITY): Payer: Self-pay

## 2022-12-19 ENCOUNTER — Encounter (HOSPITAL_COMMUNITY): Payer: Self-pay | Admitting: Psychiatry

## 2022-12-19 ENCOUNTER — Telehealth (HOSPITAL_BASED_OUTPATIENT_CLINIC_OR_DEPARTMENT_OTHER): Payer: PPO | Admitting: Psychiatry

## 2022-12-19 ENCOUNTER — Other Ambulatory Visit: Payer: Self-pay

## 2022-12-19 ENCOUNTER — Other Ambulatory Visit: Payer: Self-pay | Admitting: Family Medicine

## 2022-12-19 DIAGNOSIS — E1169 Type 2 diabetes mellitus with other specified complication: Secondary | ICD-10-CM

## 2022-12-19 DIAGNOSIS — F319 Bipolar disorder, unspecified: Secondary | ICD-10-CM

## 2022-12-19 DIAGNOSIS — F5101 Primary insomnia: Secondary | ICD-10-CM

## 2022-12-19 DIAGNOSIS — F419 Anxiety disorder, unspecified: Secondary | ICD-10-CM

## 2022-12-19 DIAGNOSIS — K219 Gastro-esophageal reflux disease without esophagitis: Secondary | ICD-10-CM

## 2022-12-19 DIAGNOSIS — R600 Localized edema: Secondary | ICD-10-CM

## 2022-12-19 MED ORDER — LAMOTRIGINE 150 MG PO TABS
150.0000 mg | ORAL_TABLET | Freq: Two times a day (BID) | ORAL | 0 refills | Status: DC
  Filled 2022-12-19 (×2): qty 180, 90d supply, fill #0

## 2022-12-19 MED ORDER — PANTOPRAZOLE SODIUM 40 MG PO TBEC
40.0000 mg | DELAYED_RELEASE_TABLET | Freq: Every day | ORAL | 0 refills | Status: DC
Start: 2022-12-19 — End: 2023-03-06
  Filled 2022-12-19: qty 90, 90d supply, fill #0

## 2022-12-19 MED ORDER — DULOXETINE HCL 60 MG PO CPEP
60.0000 mg | ORAL_CAPSULE | Freq: Every day | ORAL | 0 refills | Status: DC
Start: 2022-12-19 — End: 2023-04-21
  Filled 2022-12-19: qty 90, 90d supply, fill #0

## 2022-12-19 MED ORDER — LURASIDONE HCL 120 MG PO TABS
120.0000 mg | ORAL_TABLET | Freq: Every day | ORAL | 0 refills | Status: DC
Start: 2022-12-19 — End: 2023-04-21
  Filled 2023-01-26: qty 90, 90d supply, fill #0

## 2022-12-19 MED ORDER — TRAZODONE HCL 100 MG PO TABS: 100.0000 mg | ORAL_TABLET | Freq: Every day | ORAL | 2 refills | Status: DC

## 2022-12-19 MED ORDER — HYDROXYZINE PAMOATE 25 MG PO CAPS
25.0000 mg | ORAL_CAPSULE | Freq: Two times a day (BID) | ORAL | 2 refills | Status: DC | PRN
  Filled 2022-12-19: qty 60, 30d supply, fill #0

## 2022-12-19 MED ORDER — HALOPERIDOL 5 MG PO TABS: 5.0000 mg | ORAL_TABLET | Freq: Two times a day (BID) | ORAL | 2 refills | Status: DC

## 2022-12-19 MED ORDER — FUROSEMIDE 20 MG PO TABS
20.0000 mg | ORAL_TABLET | Freq: Every day | ORAL | 0 refills | Status: DC
Start: 2022-12-19 — End: 2023-01-11
  Filled 2022-12-19: qty 90, 90d supply, fill #0

## 2022-12-19 MED ORDER — ONETOUCH ULTRA 2 W/DEVICE KIT
PACK | Freq: Three times a day (TID) | 0 refills | Status: AC
  Filled 2022-12-19 – 2023-06-13 (×3): qty 1, 30d supply, fill #0

## 2022-12-19 NOTE — Progress Notes (Signed)
[facility] MD Virtual Progress Note   Patient Location: Home Provider Location: Home Office  I connect with patient by video and verified that I am speaking with correct person by using two identifiers. I discussed the limitations of evaluation and management by telemedicine and the availability of in person appointments. I also discussed with the patient that there may be a patient responsible charge related to this service. The patient expressed understanding and agreed to proceed.  Brandy [name] [id number] 57 y.o.  12/19/2022 1:49 PM  History of Present Illness:  Patient is evaluated by video session.  She is lying on the bed.  She reported not doing well because one of the medicine is expensive and she cannot afford.  She did not mention which medicine she is not taking but feels that it is causing more depression.  Patient has chronic dysphoria and sometimes she is not consistent with medication, blood work and follow-ups.  We have referred for therapy but patient again did not make appointment with her therapist and asking again a new referral.  Patient did not have liver function test for a while which was recommended by primary care.  Recently she was recommended to do Pap smear and she is concerned and does not want to do it because appointment is around the time when there is a heavy traffic.  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  She reported chronic fatigue, lack of motivation.  She is taking multiple medication and does not want to cut down because she feels it is helping the paranoia.  She denies any suicidal thoughts or homicidal thoughts.  She denies any aggression, violence.  She has anxiety related to her chronic medical issues.  Past Psychiatric History: H/O inpatient in 01-15-96 after mother died.  Seen at [facility] from 13-Oct-1990Oct 13, 2000,   She saw Dr. Jarold Motto and did IOP in 01-15-16.  No h/o suicidal  attempt.  H/O paranoia, depression and paranoia. Had tried Zoloft, Wellbutrin, Rexulti, Klonopin, Abilify, Lexapro, Risperdal, temazepam, Lunesta and Seroquel.      Outpatient Encounter Medications as of 12/19/2022  Medication Sig   acetaminophen-codeine (TYLENOL #3) 300-30 MG tablet Take 1-2 tablets by mouth every 4 (four) hours as needed for moderate pain.   albuterol (VENTOLIN HFA) 108 (90 Base) MCG/ACT inhaler Inhale 2 puffs into the lungs every 4 (four) hours as needed for wheezing or shortness of breath.   amLODipine (NORVASC) 5 MG tablet Take 1 tablet (5 mg total) by mouth daily.   aspirin EC 81 MG EC tablet Take 1 tablet (81 mg total) by mouth daily.   benzonatate (TESSALON PERLES) 100 MG capsule Take 1 capsule (100 mg total) by mouth 2 (two) times daily as needed for cough. (Patient not taking: Reported on 08/24/2022)   carvedilol (COREG) 25 MG tablet Take 1 tablet (25 mg total) by mouth 2 (two) times daily with a meal.   cetirizine (ZYRTEC) 10 MG tablet Take 1 tablet (10 mg total) by mouth daily.   cloNIDine (CATAPRES) 0.1 MG tablet Take 1 tablet (0.1 mg total) by mouth at bedtime as needed for hot flashes.   Continuous Glucose Sensor (FREESTYLE LIBRE 3 SENSOR) MISC Place 1 sensor on the skin every 14 days. Use to check glucose continuously   DULoxetine (CYMBALTA) 60 MG capsule Take 1 capsule (60 mg total) by mouth daily.   Fezolinetant (VEOZAH) 45 MG TABS Take 1 tablet (  45 mg total) by mouth daily.   fluticasone (FLONASE) 50 MCG/ACT nasal spray Place 1 spray into both nostrils daily.   furosemide (LASIX) 20 MG tablet Take 1 tablet (20 mg total) by mouth daily.   gabapentin (NEURONTIN) 300 MG capsule Take 1 capsule (300 mg total) by mouth at bedtime.   glucose blood (ACCU-CHEK GUIDE) test strip Use as instructed three times daily   guaiFENesin (MUCINEX) 600 MG 12 hr tablet Take 1 tablet (600 mg total) by mouth 2 (two) times daily.   haloperidol (HALDOL) 5 MG tablet Take 1 tablet (5 mg  total) by mouth 2 (two) times daily.   hydrOXYzine (VISTARIL) 25 MG capsule Take 1 capsule (25 mg total) by mouth 2 (two) times daily as needed.   lamoTRIgine (LAMICTAL) 150 MG tablet Take 1 tablet (150 mg total) by mouth 2 (two) times daily.   Lancets (ONETOUCH DELICA PLUS LANCET33G) MISC use as directed 3 times daily before meals   losartan-hydrochlorothiazide (HYZAAR) 100-25 MG tablet Take 1 tablet by mouth daily.   Lurasidone HCl 120 MG TABS Take 1 tablet (120 mg total) by mouth daily with breakfast.   meloxicam (MOBIC) 7.5 MG tablet Take 1 tablet (7.5 mg total) by mouth daily.   metFORMIN (GLUCOPHAGE) 500 MG tablet Take 1 tablet (500 mg total) by mouth 2 (two) times daily with a meal.   nitroGLYCERIN (NITRODUR - DOSED IN MG/24 HR) 0.2 mg/hr patch Place 1 patch (0.2 mg total) onto the skin daily. Apply patch near the affected area and change location daily.   pantoprazole (PROTONIX) 40 MG tablet Take 1 tablet (40 mg total) by mouth daily.   potassium chloride SA (KLOR-CON M) 20 MEQ tablet Take 1 tablet (20 mEq total) by mouth daily.   predniSONE (DELTASONE) 10 MG tablet Take 1 tablet (10 mg total) by mouth daily.   rosuvastatin (CRESTOR) 10 MG tablet Take 1 tablet (10 mg total) by mouth daily.   Semaglutide,0.25 or 0.5MG /DOS, (OZEMPIC, 0.25 OR 0.5 MG/DOSE,) 2 MG/3ML SOPN Inject 0.25 mg into the skin once a week.   traZODone (DESYREL) 100 MG tablet Take 1 tablet (100 mg total) by mouth at bedtime.   [DISCONTINUED] losartan (COZAAR) 100 MG tablet Take 1 tablet (100 mg total) by mouth daily.   No facility-administered encounter medications on file as of 12/19/2022.    No results found for this or any previous visit (from the past 2160 hour(s)).   Psychiatric Specialty Exam: Physical Exam  Review of Systems  Gastrointestinal:  Positive for nausea.  Musculoskeletal:        Shoulder pain    Last menstrual period 11/02/2020.There is no height or weight on file to calculate BMI.  General  Appearance: Casual and lying on bed  Eye Contact:  Fair  Speech:  Slow  Volume:  Decreased  Mood:  Dysphoric  Affect:  Congruent  Thought Process:  Descriptions of Associations: Intact  Orientation:  Full (Time, Place, and Person)  Thought Content:  Rumination  Suicidal Thoughts:  No  Homicidal Thoughts:  No  Memory:  Immediate;   Fair Recent;   Fair Remote;   Fair  Judgement:  Fair  Insight:  Shallow  Psychomotor Activity:  Decreased  Concentration:  Concentration: Fair and Attention Span: Fair  Recall:  [employer] of Knowledge:  Fair  Language:  Fair  Akathisia:  No  Handed:  Right  AIMS (if indicated):     Assets:  Communication Skills Desire for Improvement Housing Transportation  ADL's:  Intact  Cognition:  WNL  Sleep:  fair     Assessment/Plan: Bipolar I disorder (HCC) - Plan: haloperidol (HALDOL) 5 MG tablet, Lurasidone HCl 120 MG TABS, lamoTRIgine (LAMICTAL) 150 MG tablet  Anxiety - Plan: DULoxetine (CYMBALTA) 60 MG capsule, hydrOXYzine (VISTARIL) 25 MG capsule  Primary insomnia - Plan: hydrOXYzine (VISTARIL) 25 MG capsule, traZODone (DESYREL) 100 MG tablet  Patient not sure that which medicine she is not taking because of the expense.  She promised to give [name] a call back but like to have other prescription refill.  I discussed again potential side effects of polypharmacy causing fatigue, tiredness but patient reported they are helping the symptoms.  Sometimes she tends to get forgetful and not consistent with the medication compliance.  She has chronic dysphoria.  Will keep all his medication as prescribed however request next visit in person.  She will also need a referral to her new therapist.  She is not sure how much she weight as she has not seen primary care for a while.  I will follow-up in 3 months.  I recommend to call us back if she has any question.  For now continue Lamictal 150 mg twice a day, Latuda 120 mg daily in the morning, Haldol 5 mg twice a day,  hydroxyzine 25 mg twice a day, trazodone 100 mg daily and Cymbalta 60 mg daily.  Discussed medication side effects again including rash, itching, sedation, drowsiness, fatigue.   Follow Up Instructions:     I discussed the assessment and treatment plan with the patient. The patient was provided an opportunity to ask questions and all were answered. The patient agreed with the plan and demonstrated an understanding of the instructions.   The patient was advised to call back or seek an in-person evaluation if the symptoms worsen or if the condition fails to improve as anticipated.    Collaboration of Care: Other provider involved in patient's care AEB notes are available in epic to review  Patient/Guardian was advised Release of Information must be obtained prior to any record release in order to collaborate their care with an outside provider. Patient/Guardian was advised if they have not already done so to contact the registration department to sign all necessary forms in order for [name] to release information regarding their care.   Consent: Patient/Guardian gives verbal consent for treatment and assignment of benefits for services provided during this visit. Patient/Guardian expressed understanding and agreed to proceed.     I provided 25 minutes of non face to face time during this encounter.  Note: This document was prepared by [employer] voice dictation technology and any errors that results from this process are unintentional.    Cleotis Nipper, MD 12/19/2022

## 2022-12-20 ENCOUNTER — Ambulatory Visit (INDEPENDENT_AMBULATORY_CARE_PROVIDER_SITE_OTHER): Payer: PPO | Admitting: Orthopaedic Surgery

## 2022-12-20 ENCOUNTER — Other Ambulatory Visit (HOSPITAL_COMMUNITY): Payer: Self-pay

## 2022-12-20 DIAGNOSIS — M7541 Impingement syndrome of right shoulder: Secondary | ICD-10-CM | POA: Diagnosis not present

## 2022-12-20 DIAGNOSIS — M19011 Primary osteoarthritis, right shoulder: Secondary | ICD-10-CM | POA: Diagnosis not present

## 2022-12-20 DIAGNOSIS — M75121 Complete rotator cuff tear or rupture of right shoulder, not specified as traumatic: Secondary | ICD-10-CM

## 2022-12-20 DIAGNOSIS — M67921 Unspecified disorder of synovium and tendon, right upper arm: Secondary | ICD-10-CM

## 2022-12-20 MED ORDER — HYDROCODONE-ACETAMINOPHEN 5-325 MG PO TABS
1.0000 | ORAL_TABLET | Freq: Every day | ORAL | 0 refills | Status: DC | PRN
Start: 2022-12-20 — End: 2023-10-03
  Filled 2022-12-20: qty 10, 10d supply, fill #0

## 2022-12-20 NOTE — Progress Notes (Signed)
Office Visit Note   Patient: Brandy [name]           Date of Birth: November 28, 1965           MRN: [id number] Visit Date: 12/20/2022              Requested by: Hoy Register, MD [address] [place],  [place] [address] PCP: Hoy Register, MD   Assessment & Plan: Visit Diagnoses:  1. Nontraumatic complete tear of right rotator cuff   2. Impingement syndrome of right shoulder   3. Arthritis of right acromioclavicular joint   4. Biceps tendinopathy, right     Plan: MRI findings were reviewed with the patient and based on the findings I have recommended shoulder arthroscopic extensive debridement with rotator cuff repair, biceps tenotomy, subacromial decompression and distal clavicle excision.  Risk benefits prognosis reviewed with the patient.  She would like to plan for surgery sometime in January.  I will prescribe a small supply of Norco for breakthrough pain.  Follow-Up Instructions: No follow-ups on file.   Orders:  No orders of the defined types were placed in this encounter.  No orders of the defined types were placed in this encounter.     Procedures: No procedures performed   Clinical Data: No additional findings.   Subjective: Chief Complaint  Patient presents with   Right Shoulder - Pain    HPI Brandy [name] returns today for right shoulder MRI scan review.  Reports of severe chronic throbbing aching pain that is worse at night and with activity.  She has undergone 1 subacromial injection so far with temporary relief. Review of Systems  Constitutional: Negative.   HENT: Negative.    Eyes: Negative.   Respiratory: Negative.    Cardiovascular: Negative.   Endocrine: Negative.   Musculoskeletal: Negative.   Neurological: Negative.   Hematological: Negative.   Psychiatric/Behavioral: Negative.    All other systems reviewed and are negative.    Objective: Vital Signs: LMP 11/02/2020   Physical Exam Vitals and nursing note reviewed.   Constitutional:      Appearance: She is well-developed.  HENT:     Head: Atraumatic.     Nose: Nose normal.  Eyes:     Extraocular Movements: Extraocular movements intact.  Cardiovascular:     Pulses: Normal pulses.  Pulmonary:     Effort: Pulmonary effort is normal.  Abdominal:     Palpations: Abdomen is soft.  Musculoskeletal:     Cervical back: Neck supple.  Skin:    General: Skin is warm.     Capillary Refill: Capillary refill takes less than 2 seconds.  Neurological:     Mental Status: She is alert. Mental status is at baseline.  Psychiatric:        Behavior: Behavior normal.        Thought Content: Thought content normal.        Judgment: Judgment normal.     Ortho Exam Examination of the shoulder shows pain and weakness with manual muscle testing of the supraspinatus and infraspinatus.  Pain with impingement and tenderness to the [facility] joint with cross body adduction sign.  Pain with Speed test.  Pain with palpation of the biceps tendon. Specialty Comments:  No specialty comments available.  Imaging: No results found.   PMFS History: Patient Active Problem List   Diagnosis Date Noted   Type 2 diabetes mellitus (HCC) 12/22/2021   Obesity (BMI 35.0-39.9 without comorbidity) 10/24/2016   Bipolar disorder (HCC) 04/29/2015  Insomnia 03/13/2015   Major depression, recurrent (HCC) 03/10/2015   Generalized anxiety disorder 03/10/2015   Dizziness and giddiness 02/20/2015   GERD (gastroesophageal reflux disease) 02/20/2015   Abnormal CXR    Dyspnea 02/02/2015   Hypoxia 02/02/2015   Acute pulmonary edema (HCC) 02/02/2015   Hypertensive urgency 02/02/2015   Hypertensive crisis    Nausea with vomiting 06/09/2011   HTN (hypertension) 12/02/2010   Diverticulitis of sigmoid colon 12/02/2010   Tobacco dependence 12/02/2010   Past Medical History:  Diagnosis Date   Allergy    Anxiety    Depression    Diverticulitis    Edema    Gallstones    GERD  (gastroesophageal reflux disease)    Hyperlipidemia    Hypertension    Osteoarthritis     Family History  Problem Relation Age of Onset   Alcohol abuse Mother    Hypertension Father    Diabetes Sister    Schizophrenia Maternal Aunt    Breast cancer Paternal Aunt        74s   Schizophrenia Maternal Grandmother    Coronary artery disease Paternal Grandmother        young age   Diabetes Paternal Grandmother    Colon cancer Neg Hx    Esophageal cancer Neg Hx    Stomach cancer Neg Hx    Rectal cancer Neg Hx     Past Surgical History:  Procedure Laterality Date   GALLBLADDER SURGERY     Social History   Occupational History   Occupation: Curator: [employer]  Tobacco Use   Smoking status: Every Day    Current packs/day: 0.50    Average packs/day: 0.5 packs/day for 10.0 years (5.0 ttl pk-yrs)    Types: Cigarettes   Smokeless tobacco: Never  Vaping Use   Vaping status: Never Used  Substance and Sexual Activity   Alcohol use: Yes    Alcohol/week: 3.0 standard drinks of alcohol    Types: 1 Glasses of wine, 2 Cans of beer per week   Drug use: Yes    Types: Marijuana   Sexual activity: Yes    Partners: Male    Birth control/protection: None

## 2022-12-21 ENCOUNTER — Other Ambulatory Visit: Payer: Self-pay

## 2022-12-22 ENCOUNTER — Other Ambulatory Visit: Payer: Self-pay

## 2022-12-22 ENCOUNTER — Other Ambulatory Visit (HOSPITAL_COMMUNITY): Payer: Self-pay

## 2022-12-22 ENCOUNTER — Telehealth: Payer: Self-pay

## 2022-12-22 NOTE — Telephone Encounter (Signed)
Pharmacy Patient Advocate Encounter  Received notification from [employer] that Prior Authorization for [facility] has been APPROVED from 12/21/2022 to 04/04/2023   PA #/Case ID/Reference #: [id number]

## 2023-01-04 ENCOUNTER — Other Ambulatory Visit (HOSPITAL_COMMUNITY): Payer: Self-pay

## 2023-01-11 ENCOUNTER — Other Ambulatory Visit (HOSPITAL_COMMUNITY): Payer: Self-pay

## 2023-01-11 ENCOUNTER — Ambulatory Visit: Payer: PPO | Attending: Family Medicine | Admitting: Family Medicine

## 2023-01-11 VITALS — BP 115/80 | HR 101 | Ht 61.0 in | Wt 184.8 lb

## 2023-01-11 DIAGNOSIS — G8929 Other chronic pain: Secondary | ICD-10-CM

## 2023-01-11 DIAGNOSIS — Z7984 Long term (current) use of oral hypoglycemic drugs: Secondary | ICD-10-CM

## 2023-01-11 DIAGNOSIS — M25511 Pain in right shoulder: Secondary | ICD-10-CM

## 2023-01-11 DIAGNOSIS — R600 Localized edema: Secondary | ICD-10-CM | POA: Diagnosis not present

## 2023-01-11 DIAGNOSIS — I152 Hypertension secondary to endocrine disorders: Secondary | ICD-10-CM

## 2023-01-11 DIAGNOSIS — E1169 Type 2 diabetes mellitus with other specified complication: Secondary | ICD-10-CM | POA: Diagnosis not present

## 2023-01-11 DIAGNOSIS — N951 Menopausal and female climacteric states: Secondary | ICD-10-CM

## 2023-01-11 DIAGNOSIS — E1159 Type 2 diabetes mellitus with other circulatory complications: Secondary | ICD-10-CM

## 2023-01-11 DIAGNOSIS — L729 Follicular cyst of the skin and subcutaneous tissue, unspecified: Secondary | ICD-10-CM

## 2023-01-11 DIAGNOSIS — Z23 Encounter for immunization: Secondary | ICD-10-CM | POA: Diagnosis not present

## 2023-01-11 DIAGNOSIS — Z9889 Other specified postprocedural states: Secondary | ICD-10-CM

## 2023-01-11 LAB — POCT GLYCOSYLATED HEMOGLOBIN (HGB A1C): HbA1c, POC (controlled diabetic range): 6.6 % (ref 0.0–7.0)

## 2023-01-11 MED ORDER — CLONIDINE HCL 0.1 MG PO TABS
0.1000 mg | ORAL_TABLET | Freq: Every evening | ORAL | 1 refills | Status: DC | PRN
  Filled 2023-01-11 – 2023-03-06 (×2): qty 90, 90d supply, fill #0
  Filled 2023-06-13: qty 90, 90d supply, fill #1

## 2023-01-11 MED ORDER — LOSARTAN POTASSIUM-HCTZ 100-25 MG PO TABS
1.0000 | ORAL_TABLET | Freq: Every day | ORAL | 1 refills | Status: DC
  Filled 2023-01-11: qty 90, 90d supply, fill #0
  Filled 2023-05-02: qty 90, 90d supply, fill #1

## 2023-01-11 MED ORDER — ROSUVASTATIN CALCIUM 10 MG PO TABS
10.0000 mg | ORAL_TABLET | Freq: Every day | ORAL | 1 refills | Status: DC
  Filled 2023-01-11 – 2023-03-06 (×2): qty 90, 90d supply, fill #0
  Filled 2023-06-13: qty 90, 90d supply, fill #1

## 2023-01-11 MED ORDER — OZEMPIC (0.25 OR 0.5 MG/DOSE) 2 MG/3ML ~~LOC~~ SOPN
0.2500 mg | PEN_INJECTOR | SUBCUTANEOUS | 6 refills | Status: DC
Start: 2023-01-11 — End: 2023-09-08
  Filled 2023-01-11 – 2023-01-26 (×2): qty 3, 56d supply, fill #0
  Filled 2023-04-07: qty 3, 56d supply, fill #1
  Filled 2023-04-11: qty 3, 28d supply, fill #1
  Filled 2023-04-12: qty 3, 56d supply, fill #1
  Filled 2023-04-28 (×3): qty 3, 56d supply, fill #0
  Filled 2023-06-23 (×2): qty 3, 56d supply, fill #1
  Filled 2023-08-18: qty 3, 56d supply, fill #2

## 2023-01-11 MED ORDER — METFORMIN HCL 500 MG PO TABS
500.0000 mg | ORAL_TABLET | Freq: Two times a day (BID) | ORAL | 1 refills | Status: DC
  Filled 2023-01-11 – 2023-04-11 (×2): qty 180, 90d supply, fill #0
  Filled 2023-08-08: qty 180, 90d supply, fill #1

## 2023-01-11 MED ORDER — AMLODIPINE BESYLATE 5 MG PO TABS
5.0000 mg | ORAL_TABLET | Freq: Every day | ORAL | 1 refills | Status: DC
  Filled 2023-01-11: qty 90, 90d supply, fill #0
  Filled 2023-05-11: qty 90, 90d supply, fill #1

## 2023-01-11 MED ORDER — CARVEDILOL 25 MG PO TABS
25.0000 mg | ORAL_TABLET | Freq: Two times a day (BID) | ORAL | 1 refills | Status: DC
  Filled 2023-01-12: qty 180, 90d supply, fill #0
  Filled 2023-04-11: qty 180, 90d supply, fill #1

## 2023-01-11 MED ORDER — FUROSEMIDE 20 MG PO TABS
20.0000 mg | ORAL_TABLET | Freq: Every day | ORAL | 0 refills | Status: DC
  Filled 2023-01-11 – 2023-03-06 (×2): qty 90, 90d supply, fill #0

## 2023-01-11 NOTE — Patient Instructions (Signed)
Diabetic Neuropathy Diabetic neuropathy refers to nerve damage that is caused by diabetes. Over time, people with diabetes can develop nerve damage throughout the body. There are several types of diabetic neuropathy: Peripheral neuropathy. This is the most common type of diabetic neuropathy. It damages the nerves that carry signals between the spinal cord and other parts of the body (peripheral nerves). This usually affects nerves in the feet, legs, hands, and arms. Autonomic neuropathy. This type causes damage to nerves that control involuntary functions (autonomic nerves). Involuntary functions are functions of the body that you do not control. They include heartbeat, body temperature, blood pressure, urination, digestion, sweating, sexual function, or response to changes in blood glucose. Focal neuropathy. This type of nerve damage affects one area of the body, such as an arm, a leg, or the face. The injury may involve one nerve or a small group of nerves. Focal neuropathy can be painful and unpredictable. It occurs most often in older adults with diabetes. This often develops suddenly, but usually improves over time and does not cause long-term problems. Proximal neuropathy. This type of nerve damage affects the nerves of the thighs, hips, buttocks, or legs. It causes severe pain, weakness, and muscle death (atrophy), usually in the thigh muscles. It is more common among older men and people who have type 2 diabetes. The length of recovery time may vary. What are the causes? Peripheral, autonomic, and focal neuropathies are caused by diabetes that is not well controlled with treatment. The cause of proximal neuropathy is not known, but it may be caused by inflammation related to uncontrolled blood glucose levels. What are the signs or symptoms? Peripheral neuropathy Peripheral neuropathy develops slowly over time. When the nerves of the feet and legs no longer work, you may experience: Burning,  stabbing, or aching pain in the legs or feet. Pain or cramping in the legs or feet. Loss of feeling (numbness) and inability to feel pressure or pain in the feet. This can lead to: Thick calluses or sores on areas of constant pressure. Ulcers. Reduced ability to feel temperature changes. Foot deformities. Muscle weakness. Loss of balance or coordination. Autonomic neuropathy The symptoms of autonomic neuropathy vary depending on which nerves are affected. Symptoms may include: Problems with digestion, such as: Nausea or vomiting. Poor appetite. Bloating. Diarrhea or constipation. Trouble swallowing. Losing weight without trying to. Problems with the heart, blood, and lungs, such as: Dizziness, especially when standing up. Fainting. Shortness of breath. Irregular heartbeat. Bladder problems, such as: Trouble starting or stopping urination. Leaking urine. Trouble emptying the bladder. Urinary tract infections (UTIs). Problems with other body functions, such as: Sweat. You may sweat too much or too little. Temperature. You might get hot easily. Or, you might feel cold more than usual. Sexual function. Men may not be able to get or maintain an erection. Women may have vaginal dryness and difficulty with arousal. Focal neuropathy Symptoms affect only one area of the body. Common symptoms include: Numbness. Tingling. Burning pain. Prickling feeling. Very sensitive skin. Weakness. Inability to move (paralysis). Muscle twitching. Muscles getting smaller (wasting). Poor coordination. Double or blurred vision. Proximal neuropathy Sudden, severe pain in the hip, thigh, or buttocks. Pain may spread from the back into the legs (sciatica). Pain and numbness in the arms and legs. Tingling. Loss of bladder control or bowel control. Weakness and wasting of thigh muscles. Difficulty getting up from a seated position. Abdominal swelling. Unexplained weight loss. How is this  diagnosed? Diagnosis varies depending on the type  of neuropathy your health care provider suspects. Peripheral neuropathy Your health care provider will do a neurologic exam. This exam checks your reflexes, how you move, and what you can feel. You may have other tests, such as: Blood tests. Tests of the fluid that surrounds the spinal cord (lumbar puncture). CT scan. MRI. Checking the nerves that control muscles (electromyogram, or EMG). Checking how quickly signals pass through your nerves (nerve conduction study). Checking a small piece of a nerve using a microscope (biopsy). Autonomic neuropathy You may have tests, such as: Tests to measure your blood pressure and heart rate. You may be secured to an exam table that moves you from a lying position to an upright position (table tilt test). Breathing tests to check your lungs. Tests to check how food moves through the digestive system (gastric emptying tests). Blood, sweat, or urine tests. Ultrasound of your bladder. Spinal fluid tests. Focal neuropathy This condition may be diagnosed with: A neurologic exam. CT scan. MRI. EMG. Nerve conduction study. Proximal neuropathy There is no test to diagnose this type of neuropathy. You may have tests to rule out other possible causes of this type of neuropathy. Tests may include: X-rays of your spine and lumbar region. Lumbar puncture. MRI. How is this treated? The goal of treatment is to keep nerve damage from getting worse. Treatment may include: Following your diabetes management plan. This will help keep your blood glucose level and your A1C level within your target range. This is the most important treatment. Using prescription pain medicine. Follow these instructions at home: Diabetes management Follow your diabetes management plan as told by your health care provider. Check your blood glucose levels. Keep your blood glucose in your target range. Have your A1C level checked at  least two times a year, or as often as told. Take over the counter and prescription medicines only as told by your health care provider. This includes insulin and diabetes medicine.  Lifestyle  Do not use any products that contain nicotine or tobacco, such as cigarettes, e-cigarettes, and chewing tobacco. If you need help quitting, ask your health care provider. Be physically active every day. Include strength training and balance exercises. Follow a healthy meal plan. Work with your health care provider to manage your blood pressure. General instructions Ask your health care provider if the medicine prescribed to you requires you to avoid driving or using machinery. Check your skin and feet every day for cuts, bruises, redness, blisters, or sores. Keep all follow-up visits. This is important. Contact a health care provider if: You have burning, stabbing, or aching pain in your legs or feet. You are unable to feel pressure or pain in your feet. You develop problems with digestion, such as: Nausea. Vomiting. Bloating. Constipation. Diarrhea. Abdominal pain. You have difficulty with urination, such as: Inability to control when you urinate (incontinence). Inability to completely empty the bladder (retention). You feel as if your heart is racing (palpitations). You feel dizzy, weak, or faint when you stand up. Get help right away if: You cannot urinate. You have sudden weakness or loss of coordination. You have trouble speaking. You have pain or pressure in your chest. You have an irregular heartbeat. You have sudden inability to move a part of your body. These symptoms may represent a serious problem that is an emergency. Do not wait to see if the symptoms will go away. Get medical help right away. Call your local emergency services (911 in the [place] Do not drive yourself to  the hospital. Summary Diabetic neuropathy is nerve damage that is caused by diabetes. It can cause numbness  and pain in the arms, legs, digestive tract, heart, and other body systems. This condition is treated by keeping your blood glucose level and your A1C level within your target range. This can help prevent neuropathy from getting worse. Check your skin and feet every day for cuts, bruises, redness, blisters, or sores. Do not use any products that contain nicotine or tobacco, such as cigarettes, e-cigarettes, and chewing tobacco. This information is not intended to replace advice given to you by your health care provider. Make sure you discuss any questions you have with your health care provider. Document Revised: 08/01/2019 Document Reviewed: 08/01/2019 Elsevier Patient Education  2024 [employer].

## 2023-01-11 NOTE — Progress Notes (Unsigned)
Subjective:  Patient ID: Brandy [name], female    DOB: February 04, 1966  Age: 57 y.o. MRN: [id number]  CC: Medical Management of Chronic Issues (Knot on neck/Pain in feet)   HPI Brandy [name] is a 57 y.o. year old female with a history of  hypertension, bipolar disorder, vertigo, GERD, history of pulmonary edema, type 2 diabetes mellitus (A1c 7.0) who comes into the clinic for a follow-up visit   Interval History: Discussed the use of AI scribe software for clinical note transcription with the patient, who gave verbal consent to proceed.  History of Present Illness            Past Medical History:  Diagnosis Date   Allergy    Anxiety    Depression    Diverticulitis    Edema    Gallstones    GERD (gastroesophageal reflux disease)    Hyperlipidemia    Hypertension    Osteoarthritis     Past Surgical History:  Procedure Laterality Date   GALLBLADDER SURGERY      Family History  Problem Relation Age of Onset   Alcohol abuse Mother    Hypertension Father    Diabetes Sister    Schizophrenia Maternal Aunt    Breast cancer Paternal Aunt        60s   Schizophrenia Maternal Grandmother    Coronary artery disease Paternal Grandmother        young age   Diabetes Paternal Grandmother    Colon cancer Neg Hx    Esophageal cancer Neg Hx    Stomach cancer Neg Hx    Rectal cancer Neg Hx     Social History   Socioeconomic History   Marital status: Married    Spouse name: Not on file   Number of children: 3   Years of education: Not on file   Highest education level: Not on file  Occupational History   Occupation: Curator:   Tobacco Use   Smoking status: Every Day    Current packs/day: 0.50    Average packs/day: 0.5 packs/day for 10.0 years (5.0 ttl pk-yrs)    Types: Cigarettes   Smokeless tobacco: Never  Vaping Use   Vaping status: Never Used  Substance and Sexual Activity   Alcohol use: Yes    Alcohol/week: 3.0 standard  drinks of alcohol    Types: 1 Glasses of wine, 2 Cans of beer per week   Drug use: Yes    Types: Marijuana   Sexual activity: Yes    Partners: Male    Birth control/protection: None  Other Topics Concern   Not on file  Social History Narrative   Not on file   Social Determinants of Health   Financial Resource Strain: Low Risk  (08/24/2022)   Overall Financial Resource Strain (CARDIA)    Difficulty of Paying Living Expenses: Not hard at all  Food Insecurity: No Food Insecurity (08/24/2022)   Hunger Vital Sign    Worried About Running Out of Food in the Last Year: Never true    Ran Out of Food in the Last Year: Never true  Transportation Needs: No Transportation Needs (08/24/2022)   PRAPARE - Administrator, Civil Service (Medical): No    Lack of Transportation (Non-Medical): No  Physical Activity: Inactive (08/24/2022)   Exercise Vital Sign    Days of Exercise per Week: 0 days    Minutes of Exercise per Session: 0 min  Stress: No  Stress Concern Present (08/24/2022)   [employer] of Occupational Health - Occupational Stress Questionnaire    Feeling of Stress : Not at all  Social Connections: Moderately Isolated (08/24/2022)   Social Connection and Isolation Panel [NHANES]    Frequency of Communication with Friends and Family: More than three times a week    Frequency of Social Gatherings with Friends and Family: Three times a week    Attends Religious Services: Never    Active Member of Clubs or Organizations: No    Attends Banker Meetings: Never    Marital Status: Married    Allergies  Allergen Reactions   Azithromycin Other (See Comments)    "Stomach cramps"   Contrast Media [Iodinated Contrast Media] Hives   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"    Outpatient Medications Prior to Visit  Medication Sig Dispense Refill   acetaminophen-codeine (TYLENOL #3) 300-30 MG tablet Take 1-2 tablets by mouth every 4  (four) hours as needed for moderate pain. 30 tablet 0   albuterol (VENTOLIN HFA) 108 (90 Base) MCG/ACT inhaler Inhale 2 puffs into the lungs every 4 (four) hours as needed for wheezing or shortness of breath. 18 g 0   amLODipine (NORVASC) 5 MG tablet Take 1 tablet (5 mg total) by mouth daily. 90 tablet 1   aspirin EC 81 MG EC tablet Take 1 tablet (81 mg total) by mouth daily.     Blood Glucose Monitoring Suppl (ONE TOUCH ULTRA 2) w/Device KIT Use as directed three times daily 1 kit 0   carvedilol (COREG) 25 MG tablet Take 1 tablet (25 mg total) by mouth 2 (two) times daily with a meal. 180 tablet 1   cetirizine (ZYRTEC) 10 MG tablet Take 1 tablet (10 mg total) by mouth daily. 90 tablet 1   cloNIDine (CATAPRES) 0.1 MG tablet Take 1 tablet (0.1 mg total) by mouth at bedtime as needed for hot flashes. 90 tablet 1   Continuous Glucose Sensor (FREESTYLE LIBRE 3 SENSOR) MISC Place 1 sensor on the skin every 14 days. Use to check glucose continuously 2 each 11   DULoxetine (CYMBALTA) 60 MG capsule Take 1 capsule (60 mg total) by mouth daily. 90 capsule 0   Fezolinetant (VEOZAH) 45 MG TABS Take 1 tablet (45 mg total) by mouth daily. 90 tablet 1   fluticasone (FLONASE) 50 MCG/ACT nasal spray Place 1 spray into both nostrils daily. 16 g 3   furosemide (LASIX) 20 MG tablet Take 1 tablet (20 mg total) by mouth daily. 90 tablet 0   gabapentin (NEURONTIN) 300 MG capsule Take 1 capsule (300 mg total) by mouth at bedtime. 90 capsule 1   glucose blood (ACCU-CHEK GUIDE) test strip Use as instructed three times daily 100 each 12   haloperidol (HALDOL) 5 MG tablet Take 1 tablet (5 mg total) by mouth 2 (two) times daily. 60 tablet 2   HYDROcodone-acetaminophen (NORCO) 5-325 MG tablet Take 1 tablet by mouth daily as needed. 10 tablet 0   hydrOXYzine (VISTARIL) 25 MG capsule Take 1 capsule (25 mg total) by mouth 2 (two) times daily as needed. 60 capsule 2   lamoTRIgine (LAMICTAL) 150 MG tablet Take 1 tablet (150 mg  total) by mouth 2 (two) times daily. 180 tablet 0   Lancets (ONETOUCH DELICA PLUS LANCET33G) MISC use as directed 3 times daily before meals 100 each 3   losartan-hydrochlorothiazide (HYZAAR) 100-25 MG tablet Take 1 tablet by mouth daily. 90  tablet 1   Lurasidone HCl 120 MG TABS Take 1 tablet (120 mg total) by mouth daily with breakfast. 90 tablet 0   Lurasidone HCl 120 MG TABS Take 1 tablet (120 mg total) by mouth daily with breakfast. 90 tablet 0   meloxicam (MOBIC) 7.5 MG tablet Take 1 tablet (7.5 mg total) by mouth daily. 30 tablet 1   metFORMIN (GLUCOPHAGE) 500 MG tablet Take 1 tablet (500 mg total) by mouth 2 (two) times daily with a meal. 180 tablet 1   nitroGLYCERIN (NITRODUR - DOSED IN MG/24 HR) 0.2 mg/hr patch Place 1 patch (0.2 mg total) onto the skin daily. Apply patch near the affected area and change location daily. 30 patch 1   pantoprazole (PROTONIX) 40 MG tablet Take 1 tablet (40 mg total) by mouth daily. 90 tablet 0   potassium chloride SA (KLOR-CON M) 20 MEQ tablet Take 1 tablet (20 mEq total) by mouth daily. 90 tablet 1   rosuvastatin (CRESTOR) 10 MG tablet Take 1 tablet (10 mg total) by mouth daily. 90 tablet 1   Semaglutide,0.25 or 0.5MG /DOS, (OZEMPIC, 0.25 OR 0.5 MG/DOSE,) 2 MG/3ML SOPN Inject 0.25 mg into the skin once a week. 3 mL 6   traZODone (DESYREL) 100 MG tablet Take 1 tablet (100 mg total) by mouth at bedtime. 30 tablet 2   benzonatate (TESSALON PERLES) 100 MG capsule Take 1 capsule (100 mg total) by mouth 2 (two) times daily as needed for cough. (Patient not taking: Reported on 01/11/2023) 20 capsule 0   guaiFENesin (MUCINEX) 600 MG 12 hr tablet Take 1 tablet (600 mg total) by mouth 2 (two) times daily. (Patient not taking: Reported on 01/11/2023) 20 tablet 0   predniSONE (DELTASONE) 10 MG tablet Take 1 tablet (10 mg total) by mouth daily. (Patient not taking: Reported on 01/11/2023) 30 tablet 0   No facility-administered medications prior to visit.     ROS Review  of Systems *** Objective:  BP 115/80   Pulse (!) 101   Ht 5\' 1"  (1.549 m)   Wt 184 lb 12.8 oz (83.8 kg)   LMP 11/02/2020   SpO2 96%   BMI 34.92 kg/m      01/11/2023    4:02 PM 09/15/2022    2:51 PM 08/24/2022    8:27 PM  BP/Weight  Systolic BP 115  --  Diastolic BP 80  --  Wt. (Lbs) 184.8  197  BMI 34.92 kg/m2  37.22 kg/m2     Information is confidential and restricted. Go to Review Flowsheets to unlock data.    Wt Readings from Last 3 Encounters:  01/11/23 184 lb 12.8 oz (83.8 kg)  08/24/22 197 lb (89.4 kg)  07/18/22 197 lb 6.4 oz (89.5 kg)     Physical Exam Constitutional:      Appearance: She is well-developed.  Cardiovascular:     Rate and Rhythm: Normal rate.     Heart sounds: Normal heart sounds. No murmur heard. Pulmonary:     Effort: Pulmonary effort is normal.     Breath sounds: Normal breath sounds. No wheezing or rales.  Chest:     Chest wall: No tenderness.  Abdominal:     General: Bowel sounds are normal. There is no distension.     Palpations: Abdomen is soft. There is no mass.     Tenderness: There is no abdominal tenderness.  Musculoskeletal:        General: Normal range of motion.     Right lower leg: No  edema.     Left lower leg: No edema.     Comments: Abduction of right shoulder limited to 45 degrees with associated tenderness  Neurological:     Mental Status: She is alert and oriented to person, place, and time.  Psychiatric:        Mood and Affect: Mood normal.    ***    Latest Ref Rng & Units 07/18/2022    4:44 PM 12/22/2021    4:30 PM 05/18/2021    4:12 PM  CMP  Glucose 70 - 99 mg/dL 355  90  732   BUN 6 - 24 mg/dL 14  19  13    Creatinine 0.57 - 1.00 mg/dL 2.02  5.42  7.06   Sodium 134 - 144 mmol/L 143  142  144   Potassium 3.5 - 5.2 mmol/L 4.1  4.0  4.2   Chloride 96 - 106 mmol/L 101  102  101   CO2 20 - 29 mmol/L 27  26  30    Calcium 8.7 - 10.2 mg/dL 23.7  9.8  62.8   Total Protein 6.0 - 8.5 g/dL   7.8   Total Bilirubin  0.0 - 1.2 mg/dL   0.2   Alkaline Phos 44 - 121 IU/L   84   AST 0 - 40 IU/L   13   ALT 0 - 32 IU/L   16     Lipid Panel     Component Value Date/Time   CHOL 193 05/05/2020 1053   TRIG 215 (H) 05/05/2020 1053   HDL 49 05/05/2020 1053   CHOLHDL 3.9 05/05/2020 1053   CHOLHDL 3.1 02/03/2015 0420   VLDL 24 02/03/2015 0420   LDLCALC 107 (H) 05/05/2020 1053    CBC    Component Value Date/Time   WBC 5.7 10/08/2015 0815   RBC 4.27 10/08/2015 0815   HGB 12.2 10/08/2015 0815   HCT 37.6 10/08/2015 0815   PLT 220 10/08/2015 0815   MCV 88.1 10/08/2015 0815   MCH 28.6 10/08/2015 0815   MCHC 32.4 10/08/2015 0815   RDW 15.1 10/08/2015 0815   LYMPHSABS 1.8 10/08/2015 0815   MONOABS 0.2 10/08/2015 0815   EOSABS 0.2 10/08/2015 0815   BASOSABS 0.0 10/08/2015 0815    Lab Results  Component Value Date   HGBA1C 6.6 01/11/2023    Assessment & Plan:  Assessment and Plan               No orders of the defined types were placed in this encounter.   Follow-up: No follow-ups on file.       Hoy Register, MD, FAAFP. [facility] [place], [place] [phone]   01/11/2023, 4:27 PM

## 2023-01-12 ENCOUNTER — Other Ambulatory Visit (HOSPITAL_COMMUNITY): Payer: Self-pay

## 2023-01-12 ENCOUNTER — Encounter: Payer: Self-pay | Admitting: Family Medicine

## 2023-01-12 LAB — BASIC METABOLIC PANEL
BUN/Creatinine Ratio: 15 (ref 9–23)
BUN: 17 mg/dL (ref 6–24)
CO2: 26 mmol/L (ref 20–29)
Calcium: 10 mg/dL (ref 8.7–10.2)
Chloride: 99 mmol/L (ref 96–106)
Creatinine, Ser: 1.15 mg/dL — ABNORMAL HIGH (ref 0.57–1.00)
Glucose: 114 mg/dL — ABNORMAL HIGH (ref 70–99)
Potassium: 3.5 mmol/L (ref 3.5–5.2)
Sodium: 140 mmol/L (ref 134–144)
eGFR: 56 mL/min/{1.73_m2} — ABNORMAL LOW (ref >59)

## 2023-01-26 ENCOUNTER — Other Ambulatory Visit (HOSPITAL_COMMUNITY): Payer: Self-pay

## 2023-01-26 ENCOUNTER — Other Ambulatory Visit: Payer: Self-pay

## 2023-01-30 ENCOUNTER — Other Ambulatory Visit (HOSPITAL_COMMUNITY): Payer: Self-pay

## 2023-02-08 ENCOUNTER — Other Ambulatory Visit (HOSPITAL_COMMUNITY): Payer: Self-pay

## 2023-02-24 ENCOUNTER — Telehealth: Payer: Self-pay

## 2023-02-24 NOTE — Telephone Encounter (Signed)
Copied from CRM [id number]. Topic: General - Other >> Feb 24, 2023  4:16 PM Turkey B wrote: Reason for CRM: pt called in about about getting free diabetic shoes. She states paperwork was faxed over 2 weeks ago and she hasn't heard anything. She gave me their number of [phone]

## 2023-02-28 NOTE — Telephone Encounter (Signed)
Call placed to number provided and fax has been re sent. Will fax once completed by PCP

## 2023-03-06 ENCOUNTER — Other Ambulatory Visit: Payer: Self-pay | Admitting: Family Medicine

## 2023-03-06 ENCOUNTER — Other Ambulatory Visit (HOSPITAL_COMMUNITY): Payer: Self-pay

## 2023-03-06 ENCOUNTER — Other Ambulatory Visit: Payer: Self-pay

## 2023-03-06 DIAGNOSIS — K219 Gastro-esophageal reflux disease without esophagitis: Secondary | ICD-10-CM

## 2023-03-06 MED ORDER — PANTOPRAZOLE SODIUM 40 MG PO TBEC
40.0000 mg | DELAYED_RELEASE_TABLET | Freq: Every day | ORAL | 1 refills | Status: DC
  Filled 2023-03-06: qty 90, 90d supply, fill #0
  Filled 2023-06-13: qty 90, 90d supply, fill #1

## 2023-03-07 NOTE — Telephone Encounter (Signed)
Per CMA fax has been re sent. Will fax once completed by PCP

## 2023-03-07 NOTE — Telephone Encounter (Signed)
Latina from [facility] called to check the status of this request  Best contact: [phone]

## 2023-03-10 ENCOUNTER — Other Ambulatory Visit (HOSPITAL_COMMUNITY): Payer: Self-pay

## 2023-03-13 NOTE — Telephone Encounter (Signed)
Pt is calling back to f/u, stating it's been almost a month since [facility] faxed in paperwork to the office. Stated [facility] just called her again and advised her they still have not received anything from the office. Pt is upset and concerned that she will miss out on this.  [facility] phone number: [phone]  Please advise.

## 2023-03-14 NOTE — Telephone Encounter (Signed)
Form has been received a document is too large to print on the paper we have in office. Phone number dialed to inform sender that fax is too large and can she scale the fax down so that we can receive the entire document.

## 2023-03-16 ENCOUNTER — Ambulatory Visit: Payer: PPO | Admitting: Physician Assistant

## 2023-03-23 ENCOUNTER — Ambulatory Visit (HOSPITAL_COMMUNITY): Payer: PPO | Admitting: Psychiatry

## 2023-03-31 ENCOUNTER — Telehealth (HOSPITAL_COMMUNITY): Payer: PPO | Admitting: Psychiatry

## 2023-04-03 ENCOUNTER — Other Ambulatory Visit (HOSPITAL_COMMUNITY): Payer: Self-pay

## 2023-04-03 ENCOUNTER — Ambulatory Visit: Payer: PPO | Admitting: Physician Assistant

## 2023-04-03 ENCOUNTER — Encounter: Payer: Self-pay | Admitting: Physician Assistant

## 2023-04-03 DIAGNOSIS — G8929 Other chronic pain: Secondary | ICD-10-CM

## 2023-04-03 DIAGNOSIS — M545 Low back pain, unspecified: Secondary | ICD-10-CM

## 2023-04-03 DIAGNOSIS — M25511 Pain in right shoulder: Secondary | ICD-10-CM | POA: Diagnosis not present

## 2023-04-03 DIAGNOSIS — M25512 Pain in left shoulder: Secondary | ICD-10-CM

## 2023-04-03 MED ORDER — ACETAMINOPHEN-CODEINE 300-30 MG PO TABS
1.0000 | ORAL_TABLET | Freq: Two times a day (BID) | ORAL | 0 refills | Status: DC | PRN
  Filled 2023-04-03: qty 30, 15d supply, fill #0

## 2023-04-03 MED ORDER — METHOCARBAMOL 750 MG PO TABS
750.0000 mg | ORAL_TABLET | Freq: Two times a day (BID) | ORAL | 0 refills | Status: DC | PRN
  Filled 2023-04-03: qty 20, 10d supply, fill #0

## 2023-04-03 MED ORDER — METHYLPREDNISOLONE 4 MG PO TBPK
ORAL_TABLET | ORAL | 0 refills | Status: DC
  Filled 2023-04-03: qty 21, 6d supply, fill #0

## 2023-04-03 NOTE — Progress Notes (Signed)
Office Visit Note   Patient: Brandy [name]           Date of Birth: [birth year]           MRN: [id number] Visit Date: 04/03/2023              Requested by: Hoy Register, MD [address] [place],  [place] [address] PCP: Hoy Register, MD   Assessment & Plan: Visit Diagnoses:  1. Chronic pain of both shoulders   2. Chronic midline low back pain without sciatica     Plan: Impression is chronic bilateral shoulder pain.  Right shoulder MRI shows supraspinatus tear.  Patient was previously planning to proceed with surgery in January but is not ready to proceed at this time.  She would like narcotic pain medication.  I have discussed referral to pain management for this and have refilled her Tylenol 3 in the meantime.  In regards to the left shoulder, she did not respond well to subacromial cortisone injection.  Will go ahead and get an MRI of the left shoulder to assess for structural abnormalities.  Follow-up with Dr. Deno Etienne once this has been completed.  In regards to her back, if sent in a referral to PT.  Have sent a steroid pack and muscle relaxer as well.  Follow-up as needed.  Follow-Up Instructions: Return for f/u after mri.   Orders:  No orders of the defined types were placed in this encounter.  Meds ordered this encounter  Medications   methylPREDNISolone (MEDROL DOSEPAK) 4 MG TBPK tablet    Sig: Take as directed    Dispense:  21 tablet    Refill:  0   methocarbamol (ROBAXIN-750) 750 MG tablet    Sig: Take 1 tablet (750 mg total) by mouth 2 (two) times daily as needed for muscle spasms.    Dispense:  20 tablet    Refill:  0   acetaminophen-codeine (TYLENOL #3) 300-30 MG tablet    Sig: Take 1 tablet by mouth 2 (two) times daily as needed for moderate pain (pain score 4-6).    Dispense:  30 tablet    Refill:  0      Procedures: No procedures performed   Clinical Data: No additional findings.   Subjective: Chief Complaint  Patient presents  with   Left Shoulder - Pain   Right Shoulder - Pain    HPI patient is a very pleasant 57 year old female who comes in today with bilateral shoulder pain and low back pain.  In regards to her right shoulder, she has undergone MRI which showed rotator cuff tear.  She was planning on scheduling surgery this January but is too afraid to proceed at this time.  She is asking for narcotic pain medicine as the Tylenol 3 is not helping.  Other issue she brings up is her left shoulder.  History of chronic left shoulder pain.  Previous subacromial injection this past summer did not significantly help.  She has not undergone MRI.  She continues to have pain to the entire shoulder and into the deltoid worse with movement of the shoulder.  In regards to her low back, she has had intermittent pain for a while.  Most recent episode is progressively worsened.  The pain is to the middle of the low back and worse when she is rolling over in the bed.  She notes occasional numbness in her thighs.  No bowel or bladder change or saddle paresthesias.  No weakness in  either leg.  She has not been to physical therapy for this.  Review of Systems as detailed in HPI.  All others reviewed and are negative.   Objective: Vital Signs: LMP 11/02/2020   Physical Exam well-developed well-nourished female no acute distress.  Alert and oriented x 3.  Ortho Exam right shoulder exam: Unchanged.  Left shoulder exam: She has approximately 75% range of motion in all planes.  She does have pain with empty can testing.  Negative drop arm.  Lumbar spine exam: She has mild spinous tenderness to the middle of the lumbar spine. slight pain with lumbar extension.  No focal weakness.  Negative straight leg raise both sides.  She is neurovascularly intact distally.  Specialty Comments:  No specialty comments available.  Imaging: No new imaging   PMFS History: Patient Active Problem List   Diagnosis Date Noted   Type 2 diabetes mellitus  (HCC) 12/22/2021   Obesity (BMI 35.0-39.9 without comorbidity) 10/24/2016   Bipolar disorder (HCC) 04/29/2015   Insomnia 03/13/2015   Major depression, recurrent (HCC) 03/10/2015   Generalized anxiety disorder 03/10/2015   Dizziness and giddiness 02/20/2015   GERD (gastroesophageal reflux disease) 02/20/2015   Abnormal CXR    Dyspnea 02/02/2015   Hypoxia 02/02/2015   Acute pulmonary edema (HCC) 02/02/2015   Hypertensive urgency 02/02/2015   Hypertensive crisis    Nausea with vomiting 06/09/2011   HTN (hypertension) 12/02/2010   Diverticulitis of sigmoid colon 12/02/2010   Tobacco dependence 12/02/2010   Past Medical History:  Diagnosis Date   Allergy    Anxiety    Depression    Diverticulitis    Edema    Gallstones    GERD (gastroesophageal reflux disease)    Hyperlipidemia    Hypertension    Osteoarthritis     Family History  Problem Relation Age of Onset   Alcohol abuse Mother    Hypertension Father    Diabetes Sister    Schizophrenia Maternal Aunt    Breast cancer Paternal Aunt        40s   Schizophrenia Maternal Grandmother    Coronary artery disease Paternal Grandmother        young age   Diabetes Paternal Grandmother    Colon cancer Neg Hx    Esophageal cancer Neg Hx    Stomach cancer Neg Hx    Rectal cancer Neg Hx     Past Surgical History:  Procedure Laterality Date   GALLBLADDER SURGERY     Social History   Occupational History   Occupation: Curator: [employer]  Tobacco Use   Smoking status: Every Day    Current packs/day: 0.50    Average packs/day: 0.5 packs/day for 10.0 years (5.0 ttl pk-yrs)    Types: Cigarettes   Smokeless tobacco: Never  Vaping Use   Vaping status: Never Used  Substance and Sexual Activity   Alcohol use: Yes    Alcohol/week: 3.0 standard drinks of alcohol    Types: 1 Glasses of wine, 2 Cans of beer per week   Drug use: Yes    Types: Marijuana   Sexual activity: Yes    Partners: Male    Birth  control/protection: None

## 2023-04-03 NOTE — Addendum Note (Signed)
Addended by: Wendi Maya on: 04/03/2023 03:37 PM   Modules accepted: Orders

## 2023-04-07 ENCOUNTER — Other Ambulatory Visit (HOSPITAL_COMMUNITY): Payer: Self-pay

## 2023-04-11 ENCOUNTER — Other Ambulatory Visit: Payer: Self-pay | Admitting: Family Medicine

## 2023-04-11 ENCOUNTER — Encounter (HOSPITAL_COMMUNITY): Payer: Self-pay

## 2023-04-11 ENCOUNTER — Other Ambulatory Visit (HOSPITAL_COMMUNITY): Payer: Self-pay

## 2023-04-11 DIAGNOSIS — E1169 Type 2 diabetes mellitus with other specified complication: Secondary | ICD-10-CM

## 2023-04-11 DIAGNOSIS — E876 Hypokalemia: Secondary | ICD-10-CM

## 2023-04-11 MED ORDER — POTASSIUM CHLORIDE CRYS ER 20 MEQ PO TBCR
20.0000 meq | EXTENDED_RELEASE_TABLET | Freq: Every day | ORAL | 0 refills | Status: DC
  Filled 2023-04-11: qty 30, 30d supply, fill #0

## 2023-04-11 NOTE — Telephone Encounter (Signed)
 [facility] called and spoke to [place], [facility] about the refill(s) metformin  requested. Advised it was sent on 01/11/23 #180/1 refill(s). She states rx was received and last RF was 12/2022. She was able to process and fill for today. Advised I would call pt and let her know.   Tried to call pt, no answer, LVMTCB   Requested Prescriptions  Pending Prescriptions Disp Refills   metFORMIN  (GLUCOPHAGE ) 500 MG tablet 180 tablet 1    Sig: Take 1 tablet (500 mg total) by mouth 2 (two) times daily with a meal.     Endocrinology:  Diabetes - Biguanides Failed - 04/11/2023  3:41 PM      Failed - Cr in normal range and within 360 days    Creat  Date Value Ref Range Status  04/29/2015 0.93 0.50 - 1.10 mg/dL Final   Creatinine, Ser  Date Value Ref Range Status  01/11/2023 1.15 (H) 0.57 - 1.00 mg/dL Final         Failed - eGFR in normal range and within 360 days    GFR calc Af Amer  Date Value Ref Range Status  05/05/2020 64 >59 mL/min/1.73 Final    Comment:    **In accordance with recommendations from the NKF-ASN Task force,**   [facility] is in the process of updating its eGFR calculation to the   2021 CKD-EPI creatinine equation that estimates kidney function   without a race variable.    GFR calc non Af Amer  Date Value Ref Range Status  05/05/2020 55 (L) >59 mL/min/1.73 Final   GFR  Date Value Ref Range Status  06/09/2011 93.93 >60.00 mL/min Final   eGFR  Date Value Ref Range Status  01/11/2023 56 (L) >59 mL/min/1.73 Final         Failed - B12 Level in normal range and within 720 days    No results found for: VITAMINB12       Failed - CBC within normal limits and completed in the last 12 months    WBC  Date Value Ref Range Status  10/08/2015 5.7 4.0 - 10.5 K/uL Final   RBC  Date Value Ref Range Status  10/08/2015 4.27 3.87 - 5.11 MIL/uL Final   Hemoglobin  Date Value Ref Range Status  10/08/2015 12.2 12.0 - 15.0 g/dL Final   HCT  Date Value Ref Range Status   10/08/2015 37.6 36.0 - 46.0 % Final   MCHC  Date Value Ref Range Status  10/08/2015 32.4 30.0 - 36.0 g/dL Final   MCH  Date Value Ref Range Status  10/08/2015 28.6 26.0 - 34.0 pg Final   MCV  Date Value Ref Range Status  10/08/2015 88.1 78.0 - 100.0 fL Final   No results found for: PLTCOUNTKUC, LABPLAT, POCPLA RDW  Date Value Ref Range Status  10/08/2015 15.1 11.5 - 15.5 % Final         Passed - HBA1C is between 0 and 7.9 and within 180 days    HbA1c, POC (controlled diabetic range)  Date Value Ref Range Status  01/11/2023 6.6 0.0 - 7.0 % Final         Passed - Valid encounter within last 6 months    Recent Outpatient Visits           3 months ago Type 2 diabetes mellitus with other specified complication, without long-term current use of insulin (HCC)   [facility] [place]. [facility] [place], [place],  MD   8 months ago Type 2 diabetes mellitus with other specified complication, without long-term current use of insulin (HCC)   [facility] [place]. [facility] Delbert Clam, MD   1 year ago Hypertension associated with diabetes [facility])   [facility] [place]. [facility] Delbert Clam, MD   1 year ago Essential hypertension   [facility] [place]. [facility] Delbert Clam, MD   1 year ago Need for hepatitis C screening test   [facility] [place]. [facility] [place], [place], [place]

## 2023-04-11 NOTE — Telephone Encounter (Signed)
 Medication Refill -  Most Recent Primary Care Visit:  Provider: NEWLIN, ENOBONG  Department: [facility]  Visit Type: OFFICE VISIT  Date: 01/11/2023  Medication: metFORMIN  (GLUCOPHAGE ) 500 MG tablet. Patient states she is completely out and would like request expedited.   Has the patient contacted their pharmacy? Yes  (Agent: If yes, when and what did the pharmacy advise?)  Is this the correct pharmacy for this prescription? Yes  This is the patient's preferred pharmacy:  [place] [facility] [address] [place] [address] Phone: [phone] Fax: [phone]   Has the prescription been filled recently? Yes  Is the patient out of the medication? Yes  Has the patient been seen for an appointment in the last year OR does the patient have an upcoming appointment? Yes  Can we respond through MyChart? No  Agent: Please be advised that Rx refills may take up to 3 business days. We ask that you follow-up with your pharmacy.

## 2023-04-12 ENCOUNTER — Other Ambulatory Visit (HOSPITAL_COMMUNITY): Payer: Self-pay

## 2023-04-12 ENCOUNTER — Ambulatory Visit: Payer: Medicare Other | Admitting: Rehabilitative and Restorative Service Providers"

## 2023-04-12 ENCOUNTER — Other Ambulatory Visit: Payer: Self-pay

## 2023-04-14 ENCOUNTER — Other Ambulatory Visit: Payer: Self-pay

## 2023-04-14 ENCOUNTER — Other Ambulatory Visit (HOSPITAL_COMMUNITY): Payer: Self-pay

## 2023-04-16 ENCOUNTER — Ambulatory Visit
Admission: RE | Admit: 2023-04-16 | Discharge: 2023-04-16 | Disposition: A | Discharge status: Home / Self Care | Payer: PPO | Source: Ambulatory Visit | Attending: Physician Assistant | Admitting: Physician Assistant

## 2023-04-16 DIAGNOSIS — G8929 Other chronic pain: Secondary | ICD-10-CM

## 2023-04-17 ENCOUNTER — Other Ambulatory Visit (HOSPITAL_COMMUNITY): Payer: Self-pay

## 2023-04-18 NOTE — Progress Notes (Signed)
F/u with xu to discuss

## 2023-04-19 ENCOUNTER — Telehealth: Payer: Self-pay

## 2023-04-19 NOTE — Telephone Encounter (Signed)
 Can you please look into this and recommend alternative options?  Thank you.

## 2023-04-19 NOTE — Telephone Encounter (Signed)
 Copied from CRM [id number]. Topic: General - Other >> Apr 19, 2023 10:10 AM Lorrane Rosette wrote: Reason for CRM: Pt reports that she switched her insurance and it will not allow her to use a coupon for the Ozempic . Pt stated that she is unable to pay $300 for it and she wants to know if an alternate medication such as Mounjaro could be prescribed. Cb# [phone]

## 2023-04-19 NOTE — Telephone Encounter (Signed)
 Routing to PCP for review.

## 2023-04-20 ENCOUNTER — Other Ambulatory Visit: Payer: Self-pay

## 2023-04-20 ENCOUNTER — Encounter: Payer: Self-pay | Admitting: Rehabilitative and Restorative Service Providers"

## 2023-04-20 ENCOUNTER — Ambulatory Visit: Payer: Medicare Other | Admitting: Rehabilitative and Restorative Service Providers"

## 2023-04-20 DIAGNOSIS — M6281 Muscle weakness (generalized): Secondary | ICD-10-CM

## 2023-04-20 DIAGNOSIS — R293 Abnormal posture: Secondary | ICD-10-CM

## 2023-04-20 DIAGNOSIS — M5459 Other low back pain: Secondary | ICD-10-CM | POA: Diagnosis not present

## 2023-04-20 DIAGNOSIS — R262 Difficulty in walking, not elsewhere classified: Secondary | ICD-10-CM | POA: Diagnosis not present

## 2023-04-20 NOTE — Telephone Encounter (Signed)
MEDICARE PART D DEDUCTIBLE/INITIAL BENEFIT INFO:

## 2023-04-20 NOTE — Therapy (Signed)
OUTPATIENT PHYSICAL THERAPY THORACOLUMBAR EVALUATION Date of referral: 04/03/2023 Referring provider: Cristie Hem, PA-C Referring diagnosis?  Diagnosis  M54.50,G89.29 (ICD-10-CM) - Chronic midline low back pain without sciatica   Treatment diagnosis? (if different than referring diagnosis) R29.3   R26.2   M62.81   M54.59  What was this (referring dx) caused by? Ongoing Issue and Arthritis  Nature of Condition: Chronic (continuous duration > 3 months)   Laterality: Both  Current Functional Measure Score: FOTO 28 (49 predicted in 12 visits)  Objective measurements identify impairments when they are compared to normal values, the uninvolved extremity, and prior level of function.  [x]  Yes  []  No  Objective assessment of functional ability: Moderate functional limitations   Briefly describe symptoms: Low back pain with left sided sciatica  How did symptoms start: Gradual onset, more frequent now  Average pain intensity:  Last 24 hours: 0-10/10  Past week: Same  How often does the pt experience symptoms? Frequently  How much have the symptoms interfered with usual daily activities? Quite a bit  How has condition changed since care began at this facility? NA - initial visit  In general, how is the patients overall health? Fair   BACK PAIN (STarT Back Screening Tool) Has pain spread down the leg(s) at some time in the last 2 weeks? Yes Has there been pain in the shoulder or neck at some time in the last 2 weeks? Yes, rotator cuff tears Has the pt only walked short distances because of back pain? Yes Has patient dressed more slowly because of back pain in the past 2 weeks? Yes Does patient think it's not safe for a person with this condition to be physically active? Yes, educated today Does patient have worrying thoughts a lot of the time? Yes Has patient stopped enjoying things they usually enjoy? Yes Overall, how bothersome has back pain been in the last 2 weeks?                     Very Much   Patient Name: Brandy [name] MRN: [id number] DOB:11-17-1965, 58 y.o., female Today's Date: 04/20/2023  END OF SESSION:  PT End of Session - 04/20/23 1542     Visit Number 1    Number of Visits 12    Date for PT Re-Evaluation 06/15/23    Authorization Type [employer] Medicare    Authorization - Number of Visits 12    Progress Note Due on Visit 10    PT Start Time 1345    PT Stop Time 1430    PT Time Calculation (min) 45 min    Activity Tolerance Patient tolerated treatment well;No increased pain;Patient limited by pain    Behavior During Therapy [facility] for tasks assessed/performed             Past Medical History:  Diagnosis Date   Allergy    Anxiety    Depression    Diverticulitis    Edema    Gallstones    GERD (gastroesophageal reflux disease)    Hyperlipidemia    Hypertension    Osteoarthritis    Past Surgical History:  Procedure Laterality Date   GALLBLADDER SURGERY     Patient Active Problem List   Diagnosis Date Noted   Type 2 diabetes mellitus (HCC) 12/22/2021   Obesity (BMI 35.0-39.9 without comorbidity) 10/24/2016   Bipolar disorder (HCC) 04/29/2015   Insomnia 03/13/2015   Major depression, recurrent (HCC) 03/10/2015   Generalized anxiety disorder 03/10/2015   Dizziness  and giddiness 02/20/2015   GERD (gastroesophageal reflux disease) 02/20/2015   Abnormal CXR    Dyspnea 02/02/2015   Hypoxia 02/02/2015   Acute pulmonary edema (HCC) 02/02/2015   Hypertensive urgency 02/02/2015   Hypertensive crisis    Nausea with vomiting 06/09/2011   HTN (hypertension) 12/02/2010   Diverticulitis of sigmoid colon 12/02/2010   Tobacco dependence 12/02/2010    PCP: Hoy Register, MD  REFERRING PROVIDER: Cristie Hem, PA-C  REFERRING DIAG:  Diagnosis  M54.50,G89.29 (ICD-10-CM) - Chronic midline low back pain without sciatica    Rationale for Evaluation and Treatment: Rehabilitation  THERAPY DIAG:  Abnormal posture  Difficulty  in walking, not elsewhere classified  Muscle weakness (generalized)  Other low back pain  ONSET DATE: Several months ago.  Occurs more frequently now.  SUBJECTIVE:                                                                                                                                                                                           SUBJECTIVE STATEMENT: About every 2 weeks Brandy [name]'s back goes into spasm with bed mobility, sit to stand or other movements.  She is dealing with 2 rotator cuff tears as well and surgery has been recommended for her shoulders.  She also notes radicular symptoms about 2/3 way down the left posterior thigh.  PERTINENT HISTORY:  HLD, HTN, OA, bilateral rotator cuff tears, current everyday smoker, type 2 diabetes  PAIN:  Are you having pain? Yes: NPRS scale: 0-10/10 Pain location: Low back and left posterior thigh Pain description: Sharp Aggravating factors: Bed mobility, sit to stand Relieving factors: Try ice and heat  PRECAUTIONS: Back and Shoulder  RED FLAGS: None   WEIGHT BEARING RESTRICTIONS: No  FALLS:  Has patient fallen in last 6 months? No  LIVING ENVIRONMENT: Lives with: lives with their family and lives with their spouse Lives in: House/apartment Stairs:  Does OK with stairs at home, does not need a rail Has following equipment at home: None  OCCUPATION: Retired from [employer]  PLOF: Independent  PATIENT GOALS: Get the back under control to prepare for rotator cuff surgery  NEXT MD VISIT: 04/25/2023  OBJECTIVE:  Note: Objective measures were completed at Evaluation unless otherwise noted.  DIAGNOSTIC FINDINGS:  NA  PATIENT SURVEYS:  FOTO 28 (Goal 49 in 12 visits)  COGNITION: Overall cognitive status: Within functional limits for tasks assessed     SENSATION: Numbness of left posterior thigh 2/3 way to the knee  MUSCLE LENGTH: Hamstrings: Right 35 deg; Left 35 deg  POSTURE: rounded shoulders   LUMBAR  ROM:   AROM 04/20/2023  Flexion  Extension 10  Right lateral flexion   Left lateral flexion   Right rotation   Left rotation    (Blank rows = not tested)  LOWER EXTREMITY ROM:     Passive  Left/Right 04/20/2023   Hip flexion 90/90   Hip extension    Hip abduction    Hip adduction    Hip internal rotation 5/6   Hip external rotation 26/37   Knee flexion    Knee extension    Ankle dorsiflexion    Ankle plantarflexion    Ankle inversion    Ankle eversion     (Blank rows = not tested)  STRENGTH: Deferred at evaluation secondary to discomfort with test positions either for the low back or bilateral shoulders  MMT    Hip flexion    Hip extension    Hip abduction    Hip adduction    Hip internal rotation    Hip external rotation    Knee flexion    Knee extension    Ankle dorsiflexion    Ankle plantarflexion    Ankle inversion    Ankle eversion     (Blank rows = not tested)  GAIT: Distance walked: 100 feet Assistive device utilized: None Level of assistance: Complete Independence Comments: Brandy [name] has difficulty breathing and is limited to short walks  TREATMENT DATE:  04/20/2023 Standing lumbar extension AROM with hands in front 10 x 3 seconds Scapular retraction/shoulder blade pinches 10 x 5 seconds Supine figure 4 stretch without upper extremity assistance 5 x 20 seconds bilateral  Functional Activities: Went over practical logroll for bed mobility secondary to her difficulty at night, reviewed the importance of avoiding prolonged postures, flexion and the combination of bending lifting and twisting.  Reviewed exam findings and home exercise program.                                                                                                                         PATIENT EDUCATION:  Education details: See above Person educated: Patient Education method: Explanation, Demonstration, Tactile cues, Verbal cues, and Handouts Education comprehension:  verbalized understanding, returned demonstration, verbal cues required, tactile cues required, and needs further education  HOME EXERCISE PROGRAM: Access Code: [email or web address] URL: [email or web address] Date: 04/20/2023 Prepared by: Pauletta Browns  Exercises - Standing Lumbar Extension at Wall - Forearms  - 5 x daily - 7 x weekly - 1 sets - 5 reps - 3 seconds hold - Standing Scapular Retraction  - 5 x daily - 7 x weekly - 1 sets - 5 reps - 5 second hold - Supine Figure 4 Piriformis Stretch  - 2-3 x daily - 7 x weekly - 1 sets - 5 reps - 20 seconds hold  ASSESSMENT:  CLINICAL IMPRESSION: Patient is a 58 y.o. female who was seen today for physical therapy evaluation and treatment for  Diagnosis  M54.50,G89.29 (ICD-10-CM) - Chronic midline low back pain without sciatica  .   OBJECTIVE IMPAIRMENTS: Abnormal gait, cardiopulmonary status limiting  activity, decreased activity tolerance, decreased endurance, decreased knowledge of condition, difficulty walking, decreased ROM, decreased strength, decreased safety awareness, impaired perceived functional ability, increased muscle spasms, impaired flexibility, improper body mechanics, postural dysfunction, obesity, and pain.   ACTIVITY LIMITATIONS: carrying, lifting, bending, sitting, standing, squatting, sleeping, bed mobility, and locomotion level  PARTICIPATION LIMITATIONS: cleaning, driving, and community activity  PERSONAL FACTORS: HLD, HTN, OA, bilateral rotator cuff tears, current everyday smoker, type 2 diabetes are also affecting patient's functional outcome.   REHAB POTENTIAL: Good  CLINICAL DECISION MAKING: Stable/uncomplicated  EVALUATION COMPLEXITY: Low   GOALS: Goals reviewed with patient? Yes  SHORT TERM GOALS: Target date: 05/18/2023  Brandy [name] will be independent with her day 1 home exercise program Baseline: Started 04/20/2023 Goal status: INITIAL  2.  Brandy [name] will be able to walk for between 5 to 10  minutes uninterrupted without increasing low back pain Baseline: Unable secondary to back pain and difficulty breathing at evaluation Goal status: INITIAL   LONG TERM GOALS: Target date: 06/15/2023  Improve FOTO to 49 in 12 visits Baseline: 28 Goal status: INITIAL  2.  Brandy [name] report low back pain consistently 0-2/10 on the visual analog scale Baseline: Can be as high as 10 out of 10 Goal status: INITIAL  3.  Improve bilateral lower extremity flexibility for hip flexors to at least 100 degrees; hamstrings to 50 degrees and hip external rotation to 40 degrees Baseline: 90/90; 35/35; 26/37 respectively Goal status: INITIAL  4.  Brandy [name] will have improved postural and low back strength as assessed by her comfort with ADLs and improved FOTO scores Baseline: See 1 Goal status: INITIAL  5.  Brandy [name] will be independent with her long-term maintenance home exercise program discharge Baseline: Started 04/20/2023 Goal status: INITIAL  PLAN:  PT FREQUENCY: 1-2x/week  PT DURATION: 8 weeks  PLANNED INTERVENTIONS: 97110-Therapeutic exercises, 97530- Therapeutic activity, 97112- Neuromuscular re-education, 97535- Self Care, [id number]- Manual therapy, [id number]- Gait training, [phone]- Traction (mechanical), Patient/Family education, Dry Needling, Spinal mobilization, Cryotherapy, and Moist heat.  PLAN FOR NEXT SESSION: Review day 1 home exercise program with appropriate flexibility and low back strength progressions.  Postural and body mechanics education and remember to avoid using her upper extremities with any stretching or strengthening activities due to bilateral rotator cuff repairs awaiting surgery.   Cherlyn Cushing, PT, MPT 04/20/2023, 4:07 PM

## 2023-04-20 NOTE — Telephone Encounter (Signed)
Hey friend,   Can we apply for patient assistance for this patient since she has Medicare?

## 2023-04-21 ENCOUNTER — Encounter (HOSPITAL_COMMUNITY): Payer: Self-pay | Admitting: Psychiatry

## 2023-04-21 ENCOUNTER — Telehealth (HOSPITAL_BASED_OUTPATIENT_CLINIC_OR_DEPARTMENT_OTHER): Payer: Medicare Other | Admitting: Psychiatry

## 2023-04-21 ENCOUNTER — Other Ambulatory Visit: Payer: Self-pay

## 2023-04-21 ENCOUNTER — Other Ambulatory Visit (HOSPITAL_COMMUNITY): Payer: Self-pay

## 2023-04-21 ENCOUNTER — Encounter: Payer: Self-pay | Admitting: Physical Medicine & Rehabilitation

## 2023-04-21 VITALS — Wt 184.0 lb

## 2023-04-21 DIAGNOSIS — F419 Anxiety disorder, unspecified: Secondary | ICD-10-CM

## 2023-04-21 DIAGNOSIS — F319 Bipolar disorder, unspecified: Secondary | ICD-10-CM

## 2023-04-21 DIAGNOSIS — F5101 Primary insomnia: Secondary | ICD-10-CM

## 2023-04-21 MED ORDER — HYDROXYZINE PAMOATE 25 MG PO CAPS
25.0000 mg | ORAL_CAPSULE | Freq: Every day | ORAL | 2 refills | Status: DC | PRN
  Filled 2023-04-21: qty 30, 30d supply, fill #0

## 2023-04-21 MED ORDER — HALOPERIDOL 5 MG PO TABS
5.0000 mg | ORAL_TABLET | Freq: Two times a day (BID) | ORAL | 2 refills | Status: DC
  Filled 2023-04-21: qty 60, 30d supply, fill #0
  Filled 2023-06-13 (×2): qty 60, 30d supply, fill #1

## 2023-04-21 MED ORDER — TRAZODONE HCL 100 MG PO TABS
100.0000 mg | ORAL_TABLET | Freq: Every day | ORAL | 2 refills | Status: DC
  Filled 2023-04-21: qty 30, 30d supply, fill #0
  Filled 2023-06-13: qty 30, 30d supply, fill #1

## 2023-04-21 MED ORDER — LAMOTRIGINE 150 MG PO TABS
150.0000 mg | ORAL_TABLET | Freq: Two times a day (BID) | ORAL | 0 refills | Status: DC
  Filled 2023-04-21 – 2023-06-13 (×2): qty 180, 90d supply, fill #0

## 2023-04-21 MED ORDER — DULOXETINE HCL 60 MG PO CPEP
60.0000 mg | ORAL_CAPSULE | Freq: Every day | ORAL | 0 refills | Status: DC
  Filled 2023-04-21 – 2023-06-13 (×3): qty 90, 90d supply, fill #0

## 2023-04-21 NOTE — Progress Notes (Signed)
[facility] MD Virtual Progress Note   Patient Location: Home Provider Location: Home Office  I connect with patient by video and verified that I am speaking with correct person by using two identifiers. I discussed the limitations of evaluation and management by telemedicine and the availability of in person appointments. I also discussed with the patient that there may be a patient responsible charge related to this service. The patient expressed understanding and agreed to proceed.  Brandy [name] [id number] 58 y.o.  04/21/2023 8:34 AM  History of Present Illness:  Patient is evaluated by video session.  She reported symptoms are same but not taking the medication on time.  She is no longer taking Latuda but keeping the Haldol Lamictal, Cymbalta and trazodone.  She also taking hydroxyzine which helps anxiety.  She reported a lot of pain in her shoulder and back and there is a possibility have surgery in the coming weeks but she is not sure about the dates.  She reported having poor sleep, dreams, nervousness because of the pain in her back.  She does not sleep all night.  She also reported having flashbacks and not sure if she is going through menopause.  Her weight is stable as taking the Ozempic.  Her last hemoglobin A1c 6.6 which is better than before.  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  We have recommended therapy but patient is still not able to connect with a therapist.  She now need a new referral because she do not remember the previous referral.  She has few anxiety and panic attack but able to manage and monitor.  She does not leave the house unless it is important and necessary.  She lives with her husband.  She has mild tremors and shakes but it does not interfere with her daily activities.  She admitted paranoia, ruminative thoughts and gradually hallucinations but no active suicidal thoughts or homicidal thoughts.  Patient has multiple  health issues.  Past Psychiatric History: H/O inpatient in 05/21/95 after mother died.  Seen at [facility] from 05-20-9000/16/2000,   She saw Dr. Jarold Motto and did IOP in 05/21/2015.  No h/o suicidal attempt.  H/O paranoia, depression and paranoia. Had tried Zoloft, Wellbutrin, Rexulti, Klonopin, Abilify, Lexapro, Risperdal, temazepam, Lunesta and Seroquel.    Outpatient Encounter Medications as of 04/21/2023  Medication Sig   acetaminophen-codeine (TYLENOL #3) 300-30 MG tablet Take 1-2 tablets by mouth every 4 (four) hours as needed for moderate pain.   acetaminophen-codeine (TYLENOL #3) 300-30 MG tablet Take 1 tablet by mouth 2 (two) times daily as needed for moderate pain (pain score 4-6).   albuterol (VENTOLIN HFA) 108 (90 Base) MCG/ACT inhaler Inhale 2 puffs into the lungs every 4 (four) hours as needed for wheezing or shortness of breath.   amLODipine (NORVASC) 5 MG tablet Take 1 tablet (5 mg total) by mouth daily.   aspirin EC 81 MG EC tablet Take 1 tablet (81 mg total) by mouth daily.   benzonatate (TESSALON PERLES) 100 MG capsule Take 1 capsule (100 mg total) by mouth 2 (two) times daily as needed for cough.   Blood Glucose Monitoring Suppl (ONE TOUCH ULTRA 2) w/Device KIT Use as directed three times daily   carvedilol (COREG) 25 MG tablet Take 1 tablet (25 mg total) by mouth 2 (two) times daily with a meal.   cetirizine (ZYRTEC) 10 MG tablet Take 1 tablet (10 mg total) by mouth daily.   cloNIDine (  CATAPRES) 0.1 MG tablet Take 1 tablet (0.1 mg total) by mouth at bedtime as needed for hot flashes.   Continuous Glucose Sensor (FREESTYLE LIBRE 3 SENSOR) MISC Place 1 sensor on the skin every 14 days. Use to check glucose continuously   DULoxetine (CYMBALTA) 60 MG capsule Take 1 capsule (60 mg total) by mouth daily.   Fezolinetant (VEOZAH) 45 MG TABS Take 1 tablet (45 mg total) by mouth daily.   fluticasone (FLONASE) 50 MCG/ACT nasal spray Place 1 spray into both nostrils daily.   furosemide (LASIX) 20 MG tablet  Take 1 tablet (20 mg total) by mouth daily.   gabapentin (NEURONTIN) 300 MG capsule Take 1 capsule (300 mg total) by mouth at bedtime.   glucose blood (ACCU-CHEK GUIDE) test strip Use as instructed three times daily   guaiFENesin (MUCINEX) 600 MG 12 hr tablet Take 1 tablet (600 mg total) by mouth 2 (two) times daily.   haloperidol (HALDOL) 5 MG tablet Take 1 tablet (5 mg total) by mouth 2 (two) times daily.   HYDROcodone-acetaminophen (NORCO) 5-325 MG tablet Take 1 tablet by mouth daily as needed.   hydrOXYzine (VISTARIL) 25 MG capsule Take 1 capsule (25 mg total) by mouth 2 (two) times daily as needed.   lamoTRIgine (LAMICTAL) 150 MG tablet Take 1 tablet (150 mg total) by mouth 2 (two) times daily.   Lancets (ONETOUCH DELICA PLUS LANCET33G) MISC use as directed 3 times daily before meals   losartan-hydrochlorothiazide (HYZAAR) 100-25 MG tablet Take 1 tablet by mouth daily.   Lurasidone HCl 120 MG TABS Take 1 tablet (120 mg total) by mouth daily with breakfast.   Lurasidone HCl 120 MG TABS Take 1 tablet (120 mg total) by mouth daily with breakfast.   meloxicam (MOBIC) 7.5 MG tablet Take 1 tablet (7.5 mg total) by mouth daily.   metFORMIN (GLUCOPHAGE) 500 MG tablet Take 1 tablet (500 mg total) by mouth 2 (two) times daily with a meal.   methocarbamol (ROBAXIN-750) 750 MG tablet Take 1 tablet (750 mg total) by mouth 2 (two) times daily as needed for muscle spasms.   methylPREDNISolone (MEDROL DOSEPAK) 4 MG TBPK tablet Take as directed   nitroGLYCERIN (NITRODUR - DOSED IN MG/24 HR) 0.2 mg/hr patch Place 1 patch (0.2 mg total) onto the skin daily. Apply patch near the affected area and change location daily.   pantoprazole (PROTONIX) 40 MG tablet Take 1 tablet (40 mg total) by mouth daily.   potassium chloride SA (KLOR-CON M) 20 MEQ tablet Take 1 tablet (20 mEq total) by mouth daily. **need office visit for refills**   predniSONE (DELTASONE) 10 MG tablet Take 1 tablet (10 mg total) by mouth daily.    rosuvastatin (CRESTOR) 10 MG tablet Take 1 tablet (10 mg total) by mouth daily.   Semaglutide,0.25 or 0.5MG /DOS, (OZEMPIC, 0.25 OR 0.5 MG/DOSE,) 2 MG/3ML SOPN Inject 0.25 mg into the skin once a week.   traZODone (DESYREL) 100 MG tablet Take 1 tablet (100 mg total) by mouth at bedtime.   [DISCONTINUED] losartan (COZAAR) 100 MG tablet Take 1 tablet (100 mg total) by mouth daily.   No facility-administered encounter medications on file as of 04/21/2023.    No results found for this or any previous visit (from the past 2160 hours).   Psychiatric Specialty Exam: Physical Exam  Review of Systems  Constitutional:  Positive for fatigue.  Musculoskeletal:  Positive for back pain.       Shoulder pain  Psychiatric/Behavioral:  Positive for dysphoric mood  and sleep disturbance.     Last menstrual period 11/02/2020.There is no height or weight on file to calculate BMI.  General Appearance: Fairly Groomed  Eye Contact:  Fair  Speech:  Slow,   Volume:  Decreased  Mood:  Anxious and Dysphoric  Affect:  Constricted  Thought Process:  Descriptions of Associations: Intact  Orientation:  Full (Time, Place, and Person)  Thought Content:  Paranoid Ideation and Rumination  Suicidal Thoughts:  No  Homicidal Thoughts:  No  Memory:  Immediate;   Fair Recent;   Fair Remote;   Fair  Judgement:  Fair  Insight:  Shallow  Psychomotor Activity:  Decreased and Tremor  Concentration:  Concentration: Fair and Attention Span: Fair  Recall:  [employer] of Knowledge:  Good  Language:  Fair  Akathisia:  No  Handed:  Right  AIMS (if indicated):     Assets:  Communication Skills Desire for Improvement Housing Social Support Transportation  ADL's:  Intact  Cognition:  WNL  Sleep:  fair, having dreams     Assessment/Plan: Bipolar I disorder (HCC) - Plan: lamoTRIgine (LAMICTAL) 150 MG tablet, haloperidol (HALDOL) 5 MG tablet  Primary insomnia - Plan: traZODone (DESYREL) 100 MG tablet, haloperidol  (HALDOL) 5 MG tablet, hydrOXYzine (VISTARIL) 25 MG capsule  Anxiety - Plan: lamoTRIgine (LAMICTAL) 150 MG tablet, haloperidol (HALDOL) 5 MG tablet, DULoxetine (CYMBALTA) 60 MG capsule, hydrOXYzine (VISTARIL) 25 MG capsule  I reviewed blood work results.  Hemoglobin A1c is 6.6.  She is no longer taking Latuda.  She is not taking trazodone every night but also complaining of flashback, dreams.  I recommend should see OB/GYN to check her hormone level.  She is anxious about upcoming shoulder surgery but again she is not sure when it will happen.  Discussed polypharmacy.  She feels fatigued, tired..  She has tremors we will continue Lamictal 150 mg twice a day, Haldol 5 mg twice a day, Cymbalta 60 mg daily and I recommend to take the trazodone 100 mg at bedtime.  She will take the hydroxyzine 25 mg only at bedtime.  We will refer her to see a therapist.  I encourage to get the appointment and time noted so she do not missed appointment.  I recommend if reducing the hydroxyzine does not help her fatigue then we may consider further medication adjustment.  Follow up in 3 months for in person visit.  Recommended to call us back if she is any question or any concern.   Follow Up Instructions:     I discussed the assessment and treatment plan with the patient. The patient was provided an opportunity to ask questions and all were answered. The patient agreed with the plan and demonstrated an understanding of the instructions.   The patient was advised to call back or seek an in-person evaluation if the symptoms worsen or if the condition fails to improve as anticipated.    Collaboration of Care: Other provider involved in patient's care AEB notes are available in epic to review  Patient/Guardian was advised Release of Information must be obtained prior to any record release in order to collaborate their care with an outside provider. Patient/Guardian was advised if they have not already done so to contact  the registration department to sign all necessary forms in order for [name] to release information regarding their care.   Consent: Patient/Guardian gives verbal consent for treatment and assignment of benefits for services provided during this visit. Patient/Guardian expressed understanding and agreed to proceed.  I provided 29 minutes of non face to face time during this encounter.  Note: This document was prepared by [employer] voice dictation technology and any errors that results from this process are unintentional.    Cleotis Nipper, MD 04/21/2023

## 2023-04-24 ENCOUNTER — Other Ambulatory Visit (HOSPITAL_COMMUNITY): Payer: Self-pay

## 2023-04-24 NOTE — Progress Notes (Deleted)
Office Visit Note   Patient: Brandy [name]           Date of Birth: [birth year]           MRN: [id number] Visit Date: 04/25/2023              Requested by: Hoy Register, MD [address] [place] [address] [place],  [place] [address] PCP: Hoy Register, MD   Assessment & Plan: Visit Diagnoses: No diagnosis found.  Plan: ***  Follow-Up Instructions: No follow-ups on file.   Orders:  No orders of the defined types were placed in this encounter.  No orders of the defined types were placed in this encounter.     Procedures: No procedures performed   Clinical Data: No additional findings.   Subjective: No chief complaint on file.   HPI Patient returns today to discuss MRI scan left shoulder Review of Systems  Constitutional: Negative.   HENT: Negative.    Eyes: Negative.   Respiratory: Negative.    Cardiovascular: Negative.   Endocrine: Negative.   Musculoskeletal: Negative.   Neurological: Negative.   Hematological: Negative.   Psychiatric/Behavioral: Negative.    All other systems reviewed and are negative.    Objective: Vital Signs: LMP 11/02/2020   Physical Exam Vitals and nursing note reviewed.  Constitutional:      Appearance: She is well-developed.  HENT:     Head: Atraumatic.     Nose: Nose normal.  Eyes:     Extraocular Movements: Extraocular movements intact.  Cardiovascular:     Pulses: Normal pulses.  Pulmonary:     Effort: Pulmonary effort is normal.  Abdominal:     Palpations: Abdomen is soft.  Musculoskeletal:     Cervical back: Neck supple.  Skin:    General: Skin is warm.     Capillary Refill: Capillary refill takes less than 2 seconds.  Neurological:     Mental Status: She is alert. Mental status is at baseline.  Psychiatric:        Behavior: Behavior normal.        Thought Content: Thought content normal.        Judgment: Judgment normal.     Ortho Exam  Specialty Comments:  No specialty comments  available.  Imaging: No results found.   PMFS History: Patient Active Problem List   Diagnosis Date Noted   Type 2 diabetes mellitus (HCC) 12/22/2021   Obesity (BMI 35.0-39.9 without comorbidity) 10/24/2016   Bipolar disorder (HCC) 04/29/2015   Insomnia 03/13/2015   Major depression, recurrent (HCC) 03/10/2015   Generalized anxiety disorder 03/10/2015   Dizziness and giddiness 02/20/2015   GERD (gastroesophageal reflux disease) 02/20/2015   Abnormal CXR    Dyspnea 02/02/2015   Hypoxia 02/02/2015   Acute pulmonary edema (HCC) 02/02/2015   Hypertensive urgency 02/02/2015   Hypertensive crisis    Nausea with vomiting 06/09/2011   HTN (hypertension) 12/02/2010   Diverticulitis of sigmoid colon 12/02/2010   Tobacco dependence 12/02/2010   Past Medical History:  Diagnosis Date   Allergy    Anxiety    Depression    Diverticulitis    Edema    Gallstones    GERD (gastroesophageal reflux disease)    Hyperlipidemia    Hypertension    Osteoarthritis     Family History  Problem Relation Age of Onset   Alcohol abuse Mother    Hypertension Father    Diabetes Sister    Schizophrenia Maternal Aunt    Breast cancer Paternal  Aunt        50s   Schizophrenia Maternal Grandmother    Coronary artery disease Paternal Grandmother        young age   Diabetes Paternal Grandmother    Colon cancer Neg Hx    Esophageal cancer Neg Hx    Stomach cancer Neg Hx    Rectal cancer Neg Hx     Past Surgical History:  Procedure Laterality Date   GALLBLADDER SURGERY     Social History   Occupational History   Occupation: Curator: [employer]  Tobacco Use   Smoking status: Every Day    Current packs/day: 0.50    Average packs/day: 0.5 packs/day for 10.0 years (5.0 ttl pk-yrs)    Types: Cigarettes   Smokeless tobacco: Never  Vaping Use   Vaping status: Never Used  Substance and Sexual Activity   Alcohol use: Yes    Alcohol/week: 3.0 standard drinks of alcohol     Types: 1 Glasses of wine, 2 Cans of beer per week   Drug use: Yes    Types: Marijuana   Sexual activity: Yes    Partners: Male    Birth control/protection: None

## 2023-04-25 ENCOUNTER — Ambulatory Visit: Payer: Medicare Other | Admitting: Orthopaedic Surgery

## 2023-04-25 ENCOUNTER — Ambulatory Visit (INDEPENDENT_AMBULATORY_CARE_PROVIDER_SITE_OTHER): Payer: Medicare Other | Admitting: Rehabilitative and Restorative Service Providers"

## 2023-04-25 ENCOUNTER — Encounter: Payer: Self-pay | Admitting: Rehabilitative and Restorative Service Providers"

## 2023-04-25 ENCOUNTER — Other Ambulatory Visit: Payer: Self-pay

## 2023-04-25 DIAGNOSIS — R293 Abnormal posture: Secondary | ICD-10-CM | POA: Diagnosis not present

## 2023-04-25 DIAGNOSIS — M5459 Other low back pain: Secondary | ICD-10-CM | POA: Diagnosis not present

## 2023-04-25 DIAGNOSIS — R262 Difficulty in walking, not elsewhere classified: Secondary | ICD-10-CM

## 2023-04-25 DIAGNOSIS — M6281 Muscle weakness (generalized): Secondary | ICD-10-CM | POA: Diagnosis not present

## 2023-04-25 NOTE — Therapy (Signed)
OUTPATIENT PHYSICAL THERAPY TREATMENT  Patient Name: Brandy [name] MRN: [id number] DOB:September 06, 1965, 58 y.o., female Today's Date: 04/25/2023  END OF SESSION:  PT End of Session - 04/25/23 1333     Visit Number 2    Number of Visits 12    Date for PT Re-Evaluation 06/15/23    Authorization Type [employer] Medicare $15 copay    Authorization Time Period awaiting approval response    Progress Note Due on Visit 10    PT Start Time 1345    PT Stop Time 1424    PT Time Calculation (min) 39 min    Activity Tolerance Patient limited by pain    Behavior During Therapy [place] for tasks assessed/performed              Past Medical History:  Diagnosis Date   Allergy    Anxiety    Depression    Diverticulitis    Edema    Gallstones    GERD (gastroesophageal reflux disease)    Hyperlipidemia    Hypertension    Osteoarthritis    Past Surgical History:  Procedure Laterality Date   GALLBLADDER SURGERY     Patient Active Problem List   Diagnosis Date Noted   Type 2 diabetes mellitus (HCC) 12/22/2021   Obesity (BMI 35.0-39.9 without comorbidity) 10/24/2016   Bipolar disorder (HCC) 04/29/2015   Insomnia 03/13/2015   Major depression, recurrent (HCC) 03/10/2015   Generalized anxiety disorder 03/10/2015   Dizziness and giddiness 02/20/2015   GERD (gastroesophageal reflux disease) 02/20/2015   Abnormal CXR    Dyspnea 02/02/2015   Hypoxia 02/02/2015   Acute pulmonary edema (HCC) 02/02/2015   Hypertensive urgency 02/02/2015   Hypertensive crisis    Nausea with vomiting 06/09/2011   HTN (hypertension) 12/02/2010   Diverticulitis of sigmoid colon 12/02/2010   Tobacco dependence 12/02/2010    PCP: Hoy Register, MD  REFERRING PROVIDER: Cristie Hem, PA-C  REFERRING DIAG:  Diagnosis  M54.50,G89.29 (ICD-10-CM) - Chronic midline low back pain without sciatica    Rationale for Evaluation and Treatment: Rehabilitation  THERAPY DIAG:  Abnormal posture  Difficulty in  walking, not elsewhere classified  Muscle weakness (generalized)  Other low back pain  ONSET DATE: Several months ago.  Occurs more frequently now.  SUBJECTIVE:                                                                                                                                                                                           SUBJECTIVE STATEMENT: Pt indicated having some complaints in back with prolonged standing/walking activity and sitting on stools.  Pt indicated no trouble from initial  HEP.  Overall about the same presentation reported today.   PERTINENT HISTORY:  HLD, HTN, OA, bilateral rotator cuff tears, current everyday smoker, type 2 diabetes  PAIN:  NPRS scale: at worst in last 24 hours : 7/10.  Sitting at rest upon arrival.  Pain location: Low back and left posterior thigh Pain description: Sharp Aggravating factors: Bed mobility, sit to stand Relieving factors: Try ice and heat  PRECAUTIONS: Back and Shoulder  RED FLAGS: None   WEIGHT BEARING RESTRICTIONS: No  FALLS:  Has patient fallen in last 6 months? No  LIVING ENVIRONMENT: Lives with: lives with their family and lives with their spouse Lives in: House/apartment Stairs:  Does OK with stairs at home, does not need a rail Has following equipment at home: None  OCCUPATION: Retired from [employer]  PLOF: Independent  PATIENT GOALS: Get the back under control to prepare for rotator cuff surgery  NEXT MD VISIT: 04/25/2023  OBJECTIVE:  Note: Objective measures were completed at Evaluation unless otherwise noted.  DIAGNOSTIC FINDINGS:  NA  PATIENT SURVEYS:  04/20/2023 FOTO 28 (Goal 49 in 12 visits)  COGNITION: 04/20/2023 Overall cognitive status: Within functional limits for tasks assessed     SENSATION: 04/20/2023 Numbness of left posterior thigh 2/3 way to the knee  MUSCLE LENGTH: 04/20/2023 Hamstrings: Right 35 deg; Left 35 deg  POSTURE: 04/20/2023  rounded  shoulders   LUMBAR ROM:   AROM 04/20/2023  Flexion   Extension 10  Right lateral flexion   Left lateral flexion   Right rotation   Left rotation    (Blank rows = not tested)  LOWER EXTREMITY ROM:     Passive  Left/Right 04/20/2023   Hip flexion 90/90   Hip extension    Hip abduction    Hip adduction    Hip internal rotation 5/6   Hip external rotation 26/37   Knee flexion    Knee extension    Ankle dorsiflexion    Ankle plantarflexion    Ankle inversion    Ankle eversion     (Blank rows = not tested)  STRENGTH: Deferred at evaluation secondary to discomfort with test positions either for the low back or bilateral shoulders  MMT    Hip flexion    Hip extension    Hip abduction    Hip adduction    Hip internal rotation    Hip external rotation    Knee flexion    Knee extension    Ankle dorsiflexion    Ankle plantarflexion    Ankle inversion    Ankle eversion     (Blank rows = not tested)  GAIT: 04/20/2023 Distance walked: 100 feet Assistive device utilized: None Level of assistance: Complete Independence Comments: Tamela has difficulty breathing and is limited to short walks                   TREATMENT:        DATE:04/25/2023 Therex: Nustep UE/LE for mobility/aerobic exercise 10 mins self selected pace. Standing lumbar extension AROM x 5 Seated scapular retraction 2-3 sec x 10 Supine figure 4 push away 20 sec x 3 bilateral Supine figure 4 pull towards 20 sec x 3 bilateral  Supine lumbar rotation stretch 20 sec x 3 bilateral Supine hooklying glute set 5 sec hold x 10    Additional time spent in review of HEP techniques. Time spent also in education of aerobic exercise benefits and adaptations based off symptom response to encourage increased activity tolerance.  TREATMENT:        DATE:04/20/2023 Standing lumbar extension AROM with hands in front 10 x 3 seconds Scapular retraction/shoulder blade pinches 10 x 5 seconds Supine figure 4 stretch  without upper extremity assistance 5 x 20 seconds bilateral  Functional Activities: Went over practical logroll for bed mobility secondary to her difficulty at night, reviewed the importance of avoiding prolonged postures, flexion and the combination of bending lifting and twisting.  Reviewed exam findings and home exercise program.                                                                                                                         PATIENT EDUCATION:  04/20/2023 Education details: See above Person educated: Patient Education method: Explanation, Demonstration, Tactile cues, Verbal cues, and Handouts Education comprehension: verbalized understanding, returned demonstration, verbal cues required, tactile cues required, and needs further education  HOME EXERCISE PROGRAM: Access Code: [email or web address] URL: [email or web address] Date: 04/20/2023 Prepared by: Pauletta Browns  Exercises - Standing Lumbar Extension at Wall - Forearms  - 5 x daily - 7 x weekly - 1 sets - 5 reps - 3 seconds hold - Standing Scapular Retraction  - 5 x daily - 7 x weekly - 1 sets - 5 reps - 5 second hold - Supine Figure 4 Piriformis Stretch  - 2-3 x daily - 7 x weekly - 1 sets - 5 reps - 20 seconds hold  ASSESSMENT:  CLINICAL IMPRESSION: Pt required consistent monitoring of any shoulder complaint provocation.  In general, lumbar mobility was received fair to good tolerance.  Pt to continue to benefit from skilled PT services to address pain complaints and functional limitations.   OBJECTIVE IMPAIRMENTS: Abnormal gait, cardiopulmonary status limiting activity, decreased activity tolerance, decreased endurance, decreased knowledge of condition, difficulty walking, decreased ROM, decreased strength, decreased safety awareness, impaired perceived functional ability, increased muscle spasms, impaired flexibility, improper body mechanics, postural dysfunction, obesity, and pain.   ACTIVITY  LIMITATIONS: carrying, lifting, bending, sitting, standing, squatting, sleeping, bed mobility, and locomotion level  PARTICIPATION LIMITATIONS: cleaning, driving, and community activity  PERSONAL FACTORS: HLD, HTN, OA, bilateral rotator cuff tears, current everyday smoker, type 2 diabetes are also affecting patient's functional outcome.   REHAB POTENTIAL: Good  CLINICAL DECISION MAKING: Stable/uncomplicated  EVALUATION COMPLEXITY: Low   GOALS: Goals reviewed with patient? Yes  SHORT TERM GOALS: Target date: 05/18/2023  Demetrias will be independent with her day 1 home exercise program Baseline: Started 04/20/2023 Goal status: on going 04/25/2023  2.  Saara will be able to walk for between 5 to 10 minutes uninterrupted without increasing low back pain Baseline: Unable secondary to back pain and difficulty breathing at evaluation Goal status: on going 04/25/2023   LONG TERM GOALS: Target date: 06/15/2023  Improve FOTO to 49 in 12 visits Baseline: 28 Goal status: INITIAL  2.  Victoriana report low back pain consistently 0-2/10 on the visual analog scale Baseline: Can be as high as 10 out of 10 Goal status:  INITIAL  3.  Improve bilateral lower extremity flexibility for hip flexors to at least 100 degrees; hamstrings to 50 degrees and hip external rotation to 40 degrees Baseline: 90/90; 35/35; 26/37 respectively Goal status: INITIAL  4.  Sadaya will have improved postural and low back strength as assessed by her comfort with ADLs and improved FOTO scores Baseline: See 1 Goal status: INITIAL  5.  Miho will be independent with her long-term maintenance home exercise program discharge Baseline: Started 04/20/2023 Goal status: INITIAL  PLAN:  PT FREQUENCY: 1-2x/week  PT DURATION: 8 weeks  PLANNED INTERVENTIONS: 97110-Therapeutic exercises, 97530- Therapeutic activity, 97112- Neuromuscular re-education, 97535- Self Care, [id number]- Manual therapy, [id number]- Gait  training, [phone]- Traction (mechanical), Patient/Family education, Dry Needling, Spinal mobilization, Cryotherapy, and Moist heat.  PLAN FOR NEXT SESSION: Progress lumbar related activity pending shoulder tolerance in activity.    Chyrel Masson, PT, DPT, OCS, ATC 04/25/23  2:22 PM   Date of referral: 04/03/2023 Referring provider: Cristie Hem, PA-C Referring diagnosis?  Diagnosis  M54.50,G89.29 (ICD-10-CM) - Chronic midline low back pain without sciatica   Treatment diagnosis? (if different than referring diagnosis) R29.3   R26.2   M62.81   M54.59  What was this (referring dx) caused by? Ongoing Issue and Arthritis  Nature of Condition: Chronic (continuous duration > 3 months)   Laterality: Both  Current Functional Measure Score: FOTO 28 (49 predicted in 12 visits)  Objective measurements identify impairments when they are compared to normal values, the uninvolved extremity, and prior level of function.  [x]  Yes  []  No  Objective assessment of functional ability: Moderate functional limitations   Briefly describe symptoms: Low back pain with left sided sciatica  How did symptoms start: Gradual onset, more frequent now  Average pain intensity:  Last 24 hours: 0-10/10  Past week: Same  How often does the pt experience symptoms? Frequently  How much have the symptoms interfered with usual daily activities? Quite a bit  How has condition changed since care began at this facility? NA - initial visit  In general, how is the patients overall health? Fair   BACK PAIN (STarT Back Screening Tool) Has pain spread down the leg(s) at some time in the last 2 weeks? Yes Has there been pain in the shoulder or neck at some time in the last 2 weeks? Yes, rotator cuff tears Has the pt only walked short distances because of back pain? Yes Has patient dressed more slowly because of back pain in the past 2 weeks? Yes Does patient think it's not safe for a person with this condition  to be physically active? Yes, educated today Does patient have worrying thoughts a lot of the time? Yes Has patient stopped enjoying things they usually enjoy? Yes Overall, how bothersome has back pain been in the last 2 weeks?                    Very Much

## 2023-04-27 ENCOUNTER — Encounter: Payer: Self-pay | Admitting: Physical Medicine & Rehabilitation

## 2023-04-27 ENCOUNTER — Encounter: Payer: Medicare Other | Attending: Physical Medicine & Rehabilitation | Admitting: Physical Medicine & Rehabilitation

## 2023-04-27 ENCOUNTER — Other Ambulatory Visit (HOSPITAL_COMMUNITY): Payer: Self-pay

## 2023-04-27 VITALS — BP 139/87 | HR 91 | Ht 61.0 in | Wt 190.0 lb

## 2023-04-27 DIAGNOSIS — G8929 Other chronic pain: Secondary | ICD-10-CM | POA: Diagnosis present

## 2023-04-27 DIAGNOSIS — M542 Cervicalgia: Secondary | ICD-10-CM | POA: Diagnosis present

## 2023-04-27 DIAGNOSIS — M545 Low back pain, unspecified: Secondary | ICD-10-CM | POA: Diagnosis present

## 2023-04-27 DIAGNOSIS — M797 Fibromyalgia: Secondary | ICD-10-CM

## 2023-04-27 MED ORDER — PREGABALIN 75 MG PO CAPS
75.0000 mg | ORAL_CAPSULE | Freq: Two times a day (BID) | ORAL | 3 refills | Status: DC
  Filled 2023-04-27: qty 60, 30d supply, fill #0
  Filled 2023-06-13: qty 60, 30d supply, fill #1

## 2023-04-27 NOTE — Progress Notes (Signed)
Subjective:    Patient ID: Brandy [name], female    DOB: [birth year], 58 y.o.   MRN: [id number]  Brandy Brandy  Brandy [name] is a 58 y.o. year old female  who  has a past medical history of Allergy, Anxiety, Depression, Diverticulitis, Edema, Gallstones, GERD (gastroesophageal reflux disease), Hyperlipidemia, Hypertension, and Osteoarthritis.   They are presenting to PM&R clinic as a new patient for pain management evaluation. They were referred by PA Jari Sportsman for treatment of chronic pain.  Patient reports chronic pain for about a year. Patient reports that her pain began about a year ago.  She developed chronic bilateral shoulder pain, followed by orthopedics.   Patient had a limited number of prescriptions for Tylenol with codeine and hydrocodone due to this pain.  She reports that her worst pain is in her shoulders.  Pain is throbbing in quality.  She was found to have a right shoulder supraspinatus tear, reports she is scheduled for surgery. She also has been found to have right shoulder impingement, right AC joint arthritis, biceps tendinopathy.   She is very anxious about going under anesthesia to have the surgery done.  Patient reports she developed lower back pain about 6 to 7 months ago.  She reports pain does not radiate down her legs.  She does however report that she is sore all over and has pain throughout her entire body.    Furthermore she reports pain in her feet of burning quality due to diabetic neuropathy.  She reports occasional numbness around her hips.  Patient reports poor sleep due to the pain.  She reports chronic depression and bipolar disorder followed by mental health.  She reports for many years she has had alternating constipation and diarrhea.  She reports very limited activity, stays in her room most of the time at home.  Red flag symptoms: No red flags for back pain endorsed in Hx or ROS  Medications tried: Topical medications Aspercream-  doesn't help, Voltaren gel Nsaids Meloxicam did not help  Tylenol  - Does not help  Opiates  Tyelnol #3 and hydrocodone provided benefit to her pain Gabapentin- 300mg  HS did not help  Lyrica- has not tried  TCAs  prior use for mood  SNRIs - Takes cymbalta  Medrol Dosepak  Other treatments: PT-had 2 PT visits this month.  Cost of co-pay is a concern Accupuncture/chiropractor/massage  denies TENs unit - Denies  Injections -subacromial cortisone injection did not help Surgery  R shoulder surgery planned  Very limited acticty     Prior UDS results:     Component Value Date/Time   LABOPIA NONE DETECTED 02/02/2015 0800   COCAINSCRNUR POSITIVE (A) 02/02/2015 0800   LABBENZ NONE DETECTED 02/02/2015 0800   AMPHETMU NONE DETECTED 02/02/2015 0800   THCU POSITIVE (A) 02/02/2015 0800   LABBARB NONE DETECTED 02/02/2015 0800     Pain Inventory Average Pain 10 Pain Right Now 7 My pain is intermittent, constant, sharp, burning, aching, and throbbing  In the last 24 hours, has pain interfered with the following? General activity 7 Relation with others 7 Enjoyment of life 7 What TIME of day is your pain at its worst? varies Sleep (in general) Poor  Pain is worse with: walking, sitting, standing, and some activites Pain improves with: rest and medication Relief from Meds: 4  how many minutes can you walk? 5-10 ability to climb steps?  yes do you drive?  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 need assistance with the following:  dressing, meal prep, and household duties  weakness spasms depression  New pt  New pt    Family History  Problem Relation Age of Onset   Alcohol abuse Mother    Hypertension Father    Diabetes Sister    Schizophrenia Maternal Aunt    Breast cancer Paternal Aunt        56s   Schizophrenia Maternal Grandmother    Coronary artery disease Paternal Grandmother        young age   Diabetes Paternal Grandmother    Colon  cancer Neg Hx    Esophageal cancer Neg Hx    Stomach cancer Neg Hx    Rectal cancer Neg Hx    Social History   Socioeconomic History   Marital status: Married    Spouse name: Not on file   Number of children: 3   Years of education: Not on file   Highest education level: Not on file  Occupational History   Occupation: Curator: [employer]  Tobacco Use   Smoking status: Every Day    Current packs/day: 0.50    Average packs/day: 0.5 packs/day for 10.0 years (5.0 ttl pk-yrs)    Types: Cigarettes   Smokeless tobacco: Never  Vaping Use   Vaping status: Never Used  Substance and Sexual Activity   Alcohol use: Yes    Alcohol/week: 3.0 standard drinks of alcohol    Types: 1 Glasses of wine, 2 Cans of beer per week   Drug use: Yes    Types: Marijuana   Sexual activity: Yes    Partners: Male    Birth control/protection: None  Other Topics Concern   Not on file  Social History Narrative   Not on file   Social Drivers of Health   Financial Resource Strain: Low Risk  (08/24/2022)   Overall Financial Resource Strain (CARDIA)    Difficulty of Paying Living Expenses: Not hard at all  Food Insecurity: No Food Insecurity (08/24/2022)   Hunger Vital Sign    Worried About Running Out of Food in the Last Year: Never true    Ran Out of Food in the Last Year: Never true  Transportation Needs: No Transportation Needs (08/24/2022)   PRAPARE - Administrator, Civil Service (Medical): No    Lack of Transportation (Non-Medical): No  Physical Activity: Inactive (08/24/2022)   Exercise Vital Sign    Days of Exercise per Week: 0 days    Minutes of Exercise per Session: 0 min  Stress: No Stress Concern Present (08/24/2022)   [employer] of Occupational Health - Occupational Stress Questionnaire    Feeling of Stress : Not at all  Social Connections: Moderately Isolated (08/24/2022)   Social Connection and Isolation Panel [NHANES]    Frequency of Communication with  Friends and Family: More than three times a week    Frequency of Social Gatherings with Friends and Family: Three times a week    Attends Religious Services: Never    Active Member of Clubs or Organizations: No    Attends Banker Meetings: Never    Marital Status: Married   Past Surgical History:  Procedure Laterality Date   GALLBLADDER SURGERY     Past Medical History:  Diagnosis Date   Allergy    Anxiety    Depression    Diverticulitis    Edema    Gallstones    GERD (gastroesophageal reflux  disease)    Hyperlipidemia    Hypertension    Osteoarthritis    BP 139/87   Pulse 91   Ht 5\' 1"  (1.549 m)   Wt 190 lb (86.2 kg)   LMP 11/02/2020   SpO2 91%   BMI 35.90 kg/m   Opioid Risk Score:   Fall Risk Score:  `1  Depression screen [facility] 2/9     04/27/2023    2:57 PM 08/24/2022    8:29 PM 07/18/2022    3:40 PM 07/05/2022    9:51 AM 01/10/2022    1:50 PM 12/22/2021    3:32 PM 09/20/2021    1:03 PM  Depression screen PHQ 2/9  Decreased Interest 1 3 3   3    Down, Depressed, Hopeless 3 3 3   3    PHQ - 2 Score 4 6 6   6    Altered sleeping 1 3 3   3    Tired, decreased energy 3 3 3   3    Change in appetite 3 3 3   3    Feeling bad or failure about yourself  3 3 3   3    Trouble concentrating 3 3 3   2    Moving slowly or fidgety/restless 0 3 3   3    Suicidal thoughts 1 1 3   3    PHQ-9 Score 18 25 27   26    Difficult doing work/chores Extremely dIfficult           Information is confidential and restricted. Go to Review Flowsheets to unlock data.     Review of Systems  Musculoskeletal:  Positive for back pain.       Bilateral leg pain Spasms Bilateral shoulder pain  Neurological:  Positive for weakness.  All other systems reviewed and are negative.      Objective:   Physical Exam Gen: no distress, normal appearing HEENT: oral mucosa pink and moist, NCAT Chest: normal effort, normal rate of breathing Abd: soft, non-distended Ext: no edema Psych: Flat  affect Skin: intact Neuro: Alert and awake, follows commands, cranial nerves II through XII grossly intact, normal speech and language RUE: At least 4- out of 5 elbow flexion, extension, finger flexion LUE: At least 4- out of 5 elbow flexion, extension, finger flexion Difficult to grade shoulder strength due to pain and guarding Strength bilateral lower extremities at least 4+ out of 5 Sensory exam normal for light touch and pain in all 4 limbs although decreased at bilateral feet in stocking glove type distribution. No limb ataxia or cerebellar signs. No abnormal tone appreciated.  No abnormal tone noted Musculoskeletal:  Patient would only allow minimal passive abduction of her bilateral shoulders maybe 10 degrees on the right and 20 degrees on the left Pain with shoulder movement and internal and external rotation, abduction TTP at anterior and posterior shoulder, over biceps tendon long head Mild diffuse TTP throughout bilateral upper and lower extremities to palpation TTP C-spine and L-spine paraspinals bilaterally Spurling's and slump test negative bilaterally Bilateral shoulder exams limited by patient's pain and guarding TTP throughout bilateral upper and lower extremities in all locations tested TTP and parascapular muscles bilateral  MRI left shoulder 04/16/23 IMPRESSION: 1. Complete full-thickness, full width tear of the supraspinatus tendon with 3.7 cm of retraction. 2. Moderate infraspinatus tendinosis with a small partial-thickness bursal surface tear anteriorly. 3. Severe tendinosis of the intra-articular portion of the long head of the biceps tendon. 4. Moderate partial-thickness cartilage loss of the glenohumeral joint. 12 mm  loose body along the superior aspect of the humeral head.   MRI Right shoulder 04/16/23 IMPRESSION: 1. Severe tendinosis of the supraspinatus tendon with a 7 mm full-thickness tear anteriorly. 2. Severe tendinosis of the infraspinatus  tendon. 3. Mild tendinosis of the subscapularis tendon. 4. Moderate tendinosis of the intra-articular portion of the long head of the biceps tendon.    05/25/22 Left food xray Radiographs of the left foot are negative. No bony abnormality.     Assessment & Plan:   1) Bilateral shoulder pain -MRIs of both shoulder indicates severe rotator cuff disease and tendinosis of long head of biceps tendon. Patient has supraspinatus tears both shoulders.  2) Polyneuropathy likely related to diabetes mellitus type 2 3) Marijuana use hx 4) Cocaine noted on prior UDS -Opiate risk tool high 5) Polyarthralgia -Due to diffuse body wide pain, depression, sleep disturbance, frequent diarrhea and constipation-possible IBS?  I suspect she may have fibromyalgia 6) Depression/bipolar disorder 7) Chronic low back and neck pain   -Will order L-spine and C-spine x-rays -Discussed trying to add a walking regimen -Discussed gradual progressive low impact exercise -Start Lyrica 75 mg twice daily, discontinue gabapentin -Continue follow-up with orthopedic surgery -Continue to follow with mental health --TENS unit recommended, Zynex Nexwave ordered

## 2023-04-28 ENCOUNTER — Other Ambulatory Visit: Payer: Self-pay

## 2023-04-28 ENCOUNTER — Other Ambulatory Visit (HOSPITAL_COMMUNITY): Payer: Self-pay

## 2023-05-02 ENCOUNTER — Other Ambulatory Visit (HOSPITAL_COMMUNITY): Payer: Self-pay

## 2023-05-02 ENCOUNTER — Other Ambulatory Visit: Payer: Self-pay

## 2023-05-02 ENCOUNTER — Ambulatory Visit: Payer: Medicare Other | Admitting: Orthopaedic Surgery

## 2023-05-02 DIAGNOSIS — M75122 Complete rotator cuff tear or rupture of left shoulder, not specified as traumatic: Secondary | ICD-10-CM

## 2023-05-02 DIAGNOSIS — M75121 Complete rotator cuff tear or rupture of right shoulder, not specified as traumatic: Secondary | ICD-10-CM

## 2023-05-02 NOTE — Progress Notes (Signed)
Office Visit Note   Patient: Brandy [name]           Date of Birth: 1966-02-09           MRN: [id number] Visit Date: 05/02/2023              Requested by: Hoy Register, MD [address] [place],  [place] [address] PCP: Hoy Register, MD   Assessment & Plan: Visit Diagnoses:  1. Nontraumatic complete tear of left rotator cuff   2. 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  There is associated muscle atrophy.  Her right shoulder MRI results were also reviewed with her.  Treatment options were given and she states that she needs to get some dental work done first and she would like to think about her treatment options with her shoulder.  She will get back in touch with [name].  Follow-Up Instructions: No follow-ups on file.   Orders:  No orders of the defined types were placed in this encounter.  No orders of the defined types were placed in this encounter.     Procedures: No procedures performed   Clinical Data: No additional findings.   Subjective: Chief Complaint  Patient presents with   Right Shoulder - Follow-up    MRI Review    HPI Patient comes back today for follow-up evaluation of bilateral shoulder pain.  Recently had an MRI of the left shoulder. Review of Systems  Constitutional: Negative.   HENT: Negative.    Eyes: Negative.   Respiratory: Negative.    Cardiovascular: Negative.   Endocrine: Negative.   Musculoskeletal: Negative.   Neurological: Negative.   Hematological: Negative.   Psychiatric/Behavioral: Negative.    All other systems reviewed and are negative.    Objective: Vital Signs: LMP 11/02/2020   Physical Exam Vitals and nursing note reviewed.  Constitutional:      Appearance: She is well-developed.  HENT:     Head: Normocephalic and atraumatic.  Pulmonary:     Effort: Pulmonary effort is normal.   Abdominal:     Palpations: Abdomen is soft.  Musculoskeletal:     Cervical back: Neck supple.  Skin:    General: Skin is warm.     Capillary Refill: Capillary refill takes less than 2 seconds.  Neurological:     Mental Status: She is alert and oriented to person, place, and time.  Psychiatric:        Behavior: Behavior normal.        Thought Content: Thought content normal.        Judgment: Judgment normal.    Ortho Exam Examination of bilateral shoulders show pain and weakness with forward flexion and abduction.  Passive range of motion is normal.  Significant weakness to manual muscle testing of the supraspinatus on both sides. Specialty Comments:  No specialty comments available.  Imaging: No results found.   PMFS History: Patient Active Problem List   Diagnosis Date Noted   Type 2 diabetes mellitus (HCC) 12/22/2021   Obesity (BMI 35.0-39.9 without comorbidity) 10/24/2016   Bipolar disorder (HCC) 04/29/2015   Insomnia 03/13/2015   Major depression, recurrent (HCC) 03/10/2015   Generalized anxiety disorder 03/10/2015   Dizziness and giddiness 02/20/2015   GERD (gastroesophageal reflux disease) 02/20/2015   Abnormal CXR    Dyspnea 02/02/2015   Hypoxia 02/02/2015   Acute pulmonary edema (HCC) 02/02/2015  Hypertensive urgency 02/02/2015   Hypertensive crisis    Nausea with vomiting 06/09/2011   HTN (hypertension) 12/02/2010   Diverticulitis of sigmoid colon 12/02/2010   Tobacco dependence 12/02/2010   Past Medical History:  Diagnosis Date   Allergy    Anxiety    Depression    Diverticulitis    Edema    Gallstones    GERD (gastroesophageal reflux disease)    Hyperlipidemia    Hypertension    Osteoarthritis     Family History  Problem Relation Age of Onset   Alcohol abuse Mother    Hypertension Father    Diabetes Sister    Schizophrenia Maternal Aunt    Breast cancer Paternal Aunt        77s   Schizophrenia Maternal Grandmother    Coronary artery  disease Paternal Grandmother        young age   Diabetes Paternal Grandmother    Colon cancer Neg Hx    Esophageal cancer Neg Hx    Stomach cancer Neg Hx    Rectal cancer Neg Hx     Past Surgical History:  Procedure Laterality Date   GALLBLADDER SURGERY     Social History   Occupational History   Occupation: Curator: [employer]  Tobacco Use   Smoking status: Every Day    Current packs/day: 0.50    Average packs/day: 0.5 packs/day for 10.0 years (5.0 ttl pk-yrs)    Types: Cigarettes   Smokeless tobacco: Never  Vaping Use   Vaping status: Never Used  Substance and Sexual Activity   Alcohol use: Yes    Alcohol/week: 3.0 standard drinks of alcohol    Types: 1 Glasses of wine, 2 Cans of beer per week   Drug use: Yes    Types: Marijuana   Sexual activity: Yes    Partners: Male    Birth control/protection: None

## 2023-05-03 ENCOUNTER — Other Ambulatory Visit (HOSPITAL_COMMUNITY): Payer: Self-pay

## 2023-05-04 ENCOUNTER — Encounter: Payer: Medicare Other | Admitting: Rehabilitative and Restorative Service Providers"

## 2023-05-09 ENCOUNTER — Other Ambulatory Visit: Payer: Self-pay

## 2023-05-10 ENCOUNTER — Other Ambulatory Visit: Payer: Self-pay

## 2023-05-11 ENCOUNTER — Encounter: Payer: Self-pay | Admitting: Rehabilitative and Restorative Service Providers"

## 2023-05-11 ENCOUNTER — Other Ambulatory Visit (HOSPITAL_COMMUNITY): Payer: Self-pay

## 2023-05-11 ENCOUNTER — Ambulatory Visit: Payer: Medicare Other | Admitting: Rehabilitative and Restorative Service Providers"

## 2023-05-11 DIAGNOSIS — R293 Abnormal posture: Secondary | ICD-10-CM

## 2023-05-11 DIAGNOSIS — M6281 Muscle weakness (generalized): Secondary | ICD-10-CM | POA: Diagnosis not present

## 2023-05-11 DIAGNOSIS — R262 Difficulty in walking, not elsewhere classified: Secondary | ICD-10-CM

## 2023-05-11 DIAGNOSIS — M5459 Other low back pain: Secondary | ICD-10-CM | POA: Diagnosis not present

## 2023-05-11 NOTE — Therapy (Addendum)
 OUTPATIENT PHYSICAL THERAPY TREATMENT  Patient Name: Brandy [name] MRN: [id number] DOB:1965/11/11, 58 y.o., female Today's Date: 02/23/2024  PHYSICAL THERAPY DISCHARGE SUMMARY  Visits from Start of Care: 3  Current functional level related to goals / functional outcomes: HEP   Remaining deficits: See note   Education / Equipment: See note   Patient agrees to discharge. Patient goals were partially met. Patient is being discharged due to not returning since the last visit.   END OF SESSION:    Past Medical History:  Diagnosis Date   Allergy    Anxiety    Depression    Diverticulitis    Edema    Gallstones    GERD (gastroesophageal reflux disease)    Hyperlipidemia    Hypertension    Osteoarthritis    Past Surgical History:  Procedure Laterality Date   GALLBLADDER SURGERY     Patient Active Problem List   Diagnosis Date Noted   Type 2 diabetes mellitus (HCC) 12/22/2021   Obesity (BMI 35.0-39.9 without comorbidity) 10/24/2016   Bipolar disorder (HCC) 04/29/2015   Insomnia 03/13/2015   Major depression, recurrent 03/10/2015   Generalized anxiety disorder 03/10/2015   Dizziness and giddiness 02/20/2015   GERD (gastroesophageal reflux disease) 02/20/2015   Abnormal CXR    Dyspnea 02/02/2015   Hypoxia 02/02/2015   Acute pulmonary edema (HCC) 02/02/2015   Hypertensive urgency 02/02/2015   Hypertensive crisis    Nausea with vomiting 06/09/2011   HTN (hypertension) 12/02/2010   Diverticulitis of sigmoid colon 12/02/2010   Tobacco dependence 12/02/2010    PCP: Corrina Sabin, MD  REFERRING PROVIDER: Ronal LITTIE Grave, PA-C  REFERRING DIAG:  Diagnosis  M54.50,G89.29 (ICD-10-CM) - Chronic midline low back pain without sciatica    Rationale for Evaluation and Treatment: Rehabilitation  THERAPY DIAG:  Abnormal posture  Difficulty in walking, not elsewhere classified  Muscle weakness (generalized)  Other low back pain  ONSET DATE: Several  months ago.  Occurs more frequently now.  SUBJECTIVE:                                                                                                                                                                                           SUBJECTIVE STATEMENT: Brandy [name] reports good HEP compliance.  She has not been walking.  Dr. Jerri says she is a TSA candidate on the left (not now) and the right RTC is also very bad shape.  She had a lot of questions about body mechanics with her spine with shoulder modifications.  PERTINENT HISTORY:  HLD, HTN, OA, bilateral rotator cuff tears, current everyday smoker, type 2 diabetes  PAIN:  NPRS scale:  at worst in last 24 hours: Low back 3-7/10 and left posterior thigh 3-8/10 this week. Pain location: Low back and left posterior thigh Pain description: Sharp Aggravating factors: Bed mobility, sit to stand Relieving factors: Try ice and heat  PRECAUTIONS: Back and Shoulder  RED FLAGS: None   WEIGHT BEARING RESTRICTIONS: No  FALLS:  Has patient fallen in last 6 months? No  LIVING ENVIRONMENT: Lives with: lives with their family and lives with their spouse Lives in: House/apartment Stairs: Does OK with stairs at home, does not need a rail Has following equipment at home: None  OCCUPATION: Retired from [employer]  PLOF: Independent  PATIENT GOALS: Get the back under control to prepare for rotator cuff surgery  NEXT MD VISIT: 04/25/2023  OBJECTIVE:  Note: Objective measures were completed at Evaluation unless otherwise noted.  DIAGNOSTIC FINDINGS:  NA  PATIENT SURVEYS:  04/20/2023 FOTO 28 (Goal 49 in 12 visits)  COGNITION: 04/20/2023 Overall cognitive status: Within functional limits for tasks assessed     SENSATION: 04/20/2023 Numbness of left posterior thigh 2/3 way to the knee  MUSCLE LENGTH: 04/20/2023 Hamstrings: Right 35 deg; Left 35 deg  POSTURE: 04/20/2023  rounded shoulders   LUMBAR ROM:   AROM 04/20/2023   Flexion   Extension 10  Right lateral flexion   Left lateral flexion   Right rotation   Left rotation    (Blank rows = not tested)  LOWER EXTREMITY ROM:     Passive  Left/Right 04/20/2023   Hip flexion 90/90   Hip extension    Hip abduction    Hip adduction    Hip internal rotation 5/6   Hip external rotation 26/37   Knee flexion    Knee extension    Ankle dorsiflexion    Ankle plantarflexion    Ankle inversion    Ankle eversion     (Blank rows = not tested)  STRENGTH: Deferred at evaluation secondary to discomfort with test positions either for the low back or bilateral shoulders  MMT    Hip flexion    Hip extension    Hip abduction    Hip adduction    Hip internal rotation    Hip external rotation    Knee flexion    Knee extension    Ankle dorsiflexion    Ankle plantarflexion    Ankle inversion    Ankle eversion     (Blank rows = not tested)  GAIT: 04/20/2023 Distance walked: 100 feet Assistive device utilized: None Level of assistance: Complete Independence Comments: Brandy [name] has difficulty breathing and is limited to short walks                   TREATMENT:        DATE: 05/11/2023 Lumbar extension AROM with hands in front 10 x 3 seconds Scapular retraction/shoulder blade pinches 10 x 5 seconds Supine figure 4 stretch without upper extremity assistance 5 x 20 seconds   Functional Activities: Kitchen/sink/dishwasher practical spine/shoulder mechanics Vacuum/mop/sweep practical spine/shoulder mechanics Bed mobility mechanics Sit to stand 5 x, keep the nice inward lumbar curve and slow eccentrics Hip hike in doorframe without UE support (lean on bent elbow) for gait, walking and standing endurance 2 sets of 5 x 3 seconds bilateral   04/25/2023 Therex: Nustep UE/LE for mobility/aerobic exercise 10 mins self selected pace. Standing lumbar extension AROM x 5 Seated scapular retraction 2-3 sec x 10 Supine figure 4 push away 20 sec x 3  bilateral Supine figure 4 pull towards  20 sec x 3 bilateral  Supine lumbar rotation stretch 20 sec x 3 bilateral Supine hooklying glute set 5 sec hold x 10    Additional time spent in review of HEP techniques. Time spent also in education of aerobic exercise benefits and adaptations based off symptom response to encourage increased activity tolerance.    04/20/2023 Standing lumbar extension AROM with hands in front 10 x 3 seconds Scapular retraction/shoulder blade pinches 10 x 5 seconds Supine figure 4 stretch without upper extremity assistance 5 x 20 seconds bilateral  Functional Activities: Went over practical logroll for bed mobility secondary to her difficulty at night, reviewed the importance of avoiding prolonged postures, flexion and the combination of bending lifting and twisting.  Reviewed exam findings and home exercise program.                                                                                                                         PATIENT EDUCATION:  04/20/2023 Education details: See above Person educated: Patient Education method: Explanation, Demonstration, Tactile cues, Verbal cues, and Handouts Education comprehension: verbalized understanding, returned demonstration, verbal cues required, tactile cues required, and needs further education  HOME EXERCISE PROGRAM: Access Code: [email or web address] URL: [email or web address] Date: 05/11/2023 Prepared by: Lamar Ivory  Exercises - Standing Lumbar Extension at Wall - Forearms  - 5 x daily - 7 x weekly - 1 sets - 5 reps - 3 seconds hold - Standing Scapular Retraction  - 5 x daily - 7 x weekly - 1 sets - 5 reps - 5 second hold - Supine Figure 4 Piriformis Stretch  - 2-3 x daily - 7 x weekly - 1 sets - 5 reps - 20 seconds hold - Sit to Stand Without Arm Support  - 2 x daily - 7 x weekly - 1 sets - 5 reps - Standing Hip Hiking  - 2 x daily - 7 x weekly - 2 sets - 5 reps - 3 seconds  hold  ASSESSMENT:  CLINICAL IMPRESSION: Brandy [name] is doing a good job with her early exercises, particularly given the limited help her shoulders will allow as her left shoulder is a TSA candidate and her right shoulder also has significant rotator cuff damage.  We spent a lot of time on modifications and body mechanics for practical work/ADLs and also worked on some strength progressions (hip hike).  Continue practical and spine strength work to meet long-term goals.  OBJECTIVE IMPAIRMENTS: Abnormal gait, cardiopulmonary status limiting activity, decreased activity tolerance, decreased endurance, decreased knowledge of condition, difficulty walking, decreased ROM, decreased strength, decreased safety awareness, impaired perceived functional ability, increased muscle spasms, impaired flexibility, improper body mechanics, postural dysfunction, obesity, and pain.   ACTIVITY LIMITATIONS: carrying, lifting, bending, sitting, standing, squatting, sleeping, bed mobility, and locomotion level  PARTICIPATION LIMITATIONS: cleaning, driving, and community activity  PERSONAL FACTORS: HLD, HTN, OA, bilateral rotator cuff tears, current everyday smoker, type 2 diabetes are also affecting patient's functional outcome.   REHAB POTENTIAL: Good  CLINICAL DECISION MAKING: Stable/uncomplicated  EVALUATION COMPLEXITY: Low   GOALS: Goals reviewed with patient? Yes  SHORT TERM GOALS: Target date: 05/18/2023  Brandy [name] will be independent with her day 1 home exercise program Baseline: Started 04/20/2023 Goal status: Met 05/11/2023  2.  Brandy [name] will be able to walk for between 5 to 10 minutes uninterrupted without increasing low back pain Baseline: Unable secondary to back pain and difficulty breathing at evaluation Goal status: On going 05/11/2023   LONG TERM GOALS: Target date: 06/15/2023  Improve FOTO to 49 in 12 visits Baseline: 28 Goal status: INITIAL  2.  Brandy [name] low back pain  consistently 0-2/10 on the visual analog scale Baseline: Can be as high as 10 out of 10 Goal status: INITIAL  3.  Improve bilateral lower extremity flexibility for hip flexors to at least 100 degrees; hamstrings to 50 degrees and hip external rotation to 40 degrees Baseline: 90/90; 35/35; 26/37 respectively Goal status: INITIAL  4.  Brandy [name] will have improved postural and low back strength as assessed by her comfort with ADLs and improved FOTO scores Baseline: See 1 Goal status: INITIAL  5.  Brandy [name] will be independent with her long-term maintenance home exercise program discharge Baseline: Started 04/20/2023 Goal status: INITIAL  PLAN:  PT FREQUENCY: 1-2x/week  PT DURATION: 8 weeks  PLANNED INTERVENTIONS: 97110-Therapeutic exercises, 97530- Therapeutic activity, 97112- Neuromuscular re-education, 97535- Self Care, 02859- Manual therapy, [id number]- Gait training, [phone]- Traction (mechanical), Patient/Family education, Dry Needling, Spinal mobilization, Cryotherapy, and Moist heat.  PLAN FOR NEXT SESSION: Progress low back strength wile avoiding flaring-up her shoulders.  Practical work for ADLs.  Myer LELON Ivory PT, MPT 02/23/24  1:05 PM   Date of referral: 04/03/2023 Referring provider: Ronal LITTIE Grave, PA-C Referring diagnosis?  Diagnosis  M54.50,G89.29 (ICD-10-CM) - Chronic midline low back pain without sciatica   Treatment diagnosis? (if different than referring diagnosis) R29.3   R26.2   M62.81   M54.59  What was this (referring dx) caused by? Ongoing Issue and Arthritis  Nature of Condition: Chronic (continuous duration > 3 months)   Laterality: Both  Current Functional Measure Score: FOTO 28 (49 predicted in 12 visits)  Objective measurements identify impairments when they are compared to normal values, the uninvolved extremity, and prior level of function.  [x]  Yes  []  No  Objective assessment of functional ability: Moderate functional limitations   Briefly  describe symptoms: Low back pain with left sided sciatica  How did symptoms start: Gradual onset, more frequent now  Average pain intensity:  Last 24 hours: 0-10/10  Past week: Same  How often does the pt experience symptoms? Frequently  How much have the symptoms interfered with usual daily activities? Quite a bit  How has condition changed since care began at this facility? NA - initial visit  In general, how is the patients overall health? Fair   BACK PAIN (STarT Back Screening Tool) Has pain spread down the leg(s) at some time in the last 2 weeks? Yes Has there been pain in the shoulder or neck at some time in the last 2 weeks? Yes, rotator cuff tears Has the pt only walked short distances because of back pain? Yes Has patient dressed more slowly because of back pain in the past 2 weeks? Yes Does patient think it's not safe for a person with this condition to be physically active? Yes, educated today Does patient have worrying thoughts a lot of the time? Yes Has patient stopped enjoying things they usually enjoy? Yes  Overall, how bothersome has back pain been in the last 2 weeks?                    Very Much

## 2023-05-12 ENCOUNTER — Other Ambulatory Visit: Payer: Self-pay

## 2023-05-18 ENCOUNTER — Encounter: Payer: Medicare Other | Admitting: Rehabilitative and Restorative Service Providers"

## 2023-05-25 ENCOUNTER — Encounter: Payer: Medicare Other | Admitting: Rehabilitative and Restorative Service Providers"

## 2023-06-01 ENCOUNTER — Encounter: Payer: Medicare Other | Admitting: Rehabilitative and Restorative Service Providers"

## 2023-06-02 ENCOUNTER — Encounter (HOSPITAL_COMMUNITY): Payer: Self-pay

## 2023-06-02 ENCOUNTER — Other Ambulatory Visit (HOSPITAL_COMMUNITY): Payer: Self-pay

## 2023-06-02 ENCOUNTER — Other Ambulatory Visit: Payer: Self-pay | Admitting: Family Medicine

## 2023-06-02 DIAGNOSIS — E876 Hypokalemia: Secondary | ICD-10-CM

## 2023-06-06 ENCOUNTER — Encounter (HOSPITAL_COMMUNITY): Payer: Self-pay

## 2023-06-06 ENCOUNTER — Other Ambulatory Visit (HOSPITAL_COMMUNITY): Payer: Self-pay

## 2023-06-13 ENCOUNTER — Other Ambulatory Visit: Payer: Self-pay | Admitting: Family Medicine

## 2023-06-13 ENCOUNTER — Other Ambulatory Visit: Payer: Self-pay

## 2023-06-13 ENCOUNTER — Encounter (HOSPITAL_COMMUNITY): Payer: Self-pay

## 2023-06-13 ENCOUNTER — Other Ambulatory Visit (HOSPITAL_COMMUNITY): Payer: Self-pay

## 2023-06-13 DIAGNOSIS — R600 Localized edema: Secondary | ICD-10-CM

## 2023-06-13 DIAGNOSIS — E876 Hypokalemia: Secondary | ICD-10-CM

## 2023-06-23 ENCOUNTER — Encounter: Payer: Medicare Other | Attending: Physical Medicine & Rehabilitation | Admitting: Physical Medicine & Rehabilitation

## 2023-06-23 ENCOUNTER — Other Ambulatory Visit: Payer: Self-pay

## 2023-06-23 DIAGNOSIS — M545 Low back pain, unspecified: Secondary | ICD-10-CM | POA: Insufficient documentation

## 2023-06-23 DIAGNOSIS — G8929 Other chronic pain: Secondary | ICD-10-CM | POA: Insufficient documentation

## 2023-06-23 DIAGNOSIS — M797 Fibromyalgia: Secondary | ICD-10-CM | POA: Insufficient documentation

## 2023-06-23 DIAGNOSIS — M542 Cervicalgia: Secondary | ICD-10-CM | POA: Insufficient documentation

## 2023-06-26 ENCOUNTER — Other Ambulatory Visit (HOSPITAL_COMMUNITY): Payer: Self-pay

## 2023-06-26 ENCOUNTER — Other Ambulatory Visit: Payer: Self-pay

## 2023-06-30 ENCOUNTER — Other Ambulatory Visit: Payer: Self-pay

## 2023-07-19 ENCOUNTER — Other Ambulatory Visit (HOSPITAL_COMMUNITY): Payer: Self-pay

## 2023-07-19 ENCOUNTER — Other Ambulatory Visit: Payer: Self-pay | Admitting: Family Medicine

## 2023-07-19 DIAGNOSIS — E1159 Type 2 diabetes mellitus with other circulatory complications: Secondary | ICD-10-CM

## 2023-07-19 DIAGNOSIS — E876 Hypokalemia: Secondary | ICD-10-CM

## 2023-07-19 MED ORDER — CARVEDILOL 25 MG PO TABS
25.0000 mg | ORAL_TABLET | Freq: Two times a day (BID) | ORAL | 0 refills | Status: DC
  Filled 2023-07-19: qty 60, 30d supply, fill #0

## 2023-07-19 MED ORDER — LOSARTAN POTASSIUM-HCTZ 100-25 MG PO TABS
1.0000 | ORAL_TABLET | Freq: Every day | ORAL | 0 refills | Status: DC
  Filled 2023-07-19 (×2): qty 30, 30d supply, fill #0

## 2023-07-20 ENCOUNTER — Other Ambulatory Visit (HOSPITAL_COMMUNITY): Payer: Self-pay

## 2023-07-20 ENCOUNTER — Encounter (HOSPITAL_COMMUNITY): Payer: Self-pay

## 2023-07-21 ENCOUNTER — Other Ambulatory Visit: Payer: Self-pay | Admitting: Family Medicine

## 2023-07-21 ENCOUNTER — Telehealth (HOSPITAL_COMMUNITY): Payer: Medicare Other | Admitting: Psychiatry

## 2023-07-21 ENCOUNTER — Encounter (HOSPITAL_COMMUNITY): Payer: Self-pay | Admitting: Psychiatry

## 2023-07-21 ENCOUNTER — Other Ambulatory Visit (HOSPITAL_COMMUNITY): Payer: Self-pay

## 2023-07-21 VITALS — Wt 190.0 lb

## 2023-07-21 DIAGNOSIS — E876 Hypokalemia: Secondary | ICD-10-CM

## 2023-07-21 DIAGNOSIS — F419 Anxiety disorder, unspecified: Secondary | ICD-10-CM | POA: Diagnosis not present

## 2023-07-21 DIAGNOSIS — F319 Bipolar disorder, unspecified: Secondary | ICD-10-CM | POA: Diagnosis not present

## 2023-07-21 DIAGNOSIS — F5101 Primary insomnia: Secondary | ICD-10-CM | POA: Diagnosis not present

## 2023-07-21 MED ORDER — TRAZODONE HCL 100 MG PO TABS
100.0000 mg | ORAL_TABLET | Freq: Every day | ORAL | 2 refills | Status: DC
  Filled 2023-07-21 – 2023-08-18 (×2): qty 30, 30d supply, fill #0

## 2023-07-21 MED ORDER — HALOPERIDOL 5 MG PO TABS
5.0000 mg | ORAL_TABLET | Freq: Two times a day (BID) | ORAL | 2 refills | Status: DC
Start: 2023-07-21 — End: 2023-12-20
  Filled 2023-07-21 – 2023-08-18 (×2): qty 60, 30d supply, fill #0

## 2023-07-21 MED ORDER — HYDROXYZINE PAMOATE 25 MG PO CAPS
25.0000 mg | ORAL_CAPSULE | Freq: Every day | ORAL | 2 refills | Status: DC | PRN
  Filled 2023-07-21: qty 30, 30d supply, fill #0

## 2023-07-21 MED ORDER — LAMOTRIGINE 150 MG PO TABS
150.0000 mg | ORAL_TABLET | Freq: Two times a day (BID) | ORAL | 0 refills | Status: DC
Start: 2023-07-21 — End: 2023-12-20
  Filled 2023-07-21: qty 180, 90d supply, fill #0

## 2023-07-21 MED ORDER — DULOXETINE HCL 60 MG PO CPEP
60.0000 mg | ORAL_CAPSULE | Freq: Every day | ORAL | 0 refills | Status: DC
  Filled 2023-07-21: qty 90, 90d supply, fill #0

## 2023-07-21 NOTE — Progress Notes (Signed)
  [facility] MD Virtual Progress Note   Patient Location: Home Provider Location: Home Office  I connect with patient by telephone and verified that I am speaking with correct person by using two identifiers. I discussed the limitations

## 2023-07-24 ENCOUNTER — Other Ambulatory Visit (HOSPITAL_COMMUNITY): Payer: Self-pay

## 2023-07-24 ENCOUNTER — Encounter (HOSPITAL_COMMUNITY): Payer: Self-pay

## 2023-08-07 ENCOUNTER — Other Ambulatory Visit (HOSPITAL_COMMUNITY): Payer: Self-pay

## 2023-08-08 ENCOUNTER — Other Ambulatory Visit: Payer: Self-pay | Admitting: Family Medicine

## 2023-08-08 ENCOUNTER — Other Ambulatory Visit: Payer: Self-pay

## 2023-08-08 ENCOUNTER — Other Ambulatory Visit (HOSPITAL_COMMUNITY): Payer: Self-pay

## 2023-08-08 ENCOUNTER — Encounter (HOSPITAL_COMMUNITY): Payer: Self-pay

## 2023-08-08 DIAGNOSIS — R600 Localized edema: Secondary | ICD-10-CM

## 2023-08-08 DIAGNOSIS — E1159 Type 2 diabetes mellitus with other circulatory complications: Secondary | ICD-10-CM

## 2023-08-08 DIAGNOSIS — I152 Hypertension secondary to endocrine disorders: Secondary | ICD-10-CM

## 2023-08-08 DIAGNOSIS — E876 Hypokalemia: Secondary | ICD-10-CM

## 2023-08-10 ENCOUNTER — Other Ambulatory Visit (HOSPITAL_COMMUNITY): Payer: Self-pay

## 2023-08-11 ENCOUNTER — Other Ambulatory Visit: Payer: Self-pay | Admitting: Family Medicine

## 2023-08-11 DIAGNOSIS — I152 Hypertension secondary to endocrine disorders: Secondary | ICD-10-CM

## 2023-08-11 NOTE — Telephone Encounter (Unsigned)
 Copied from CRM [id number]. Topic: Clinical - Medication Refill >> Aug 11, 2023 11:02 AM [name] wrote: Medication: amLODipine  (NORVASC ) 5 MG tablet losartan -hydrochlorothiazide  (HYZAAR ) 100-25 MG tablet   Has the patient contacted their pharmacy? Yes (Agent: If no, request that the patient contact the pharmacy for the refill. If patient does not wish to contact the pharmacy document the reason why and proceed with request.) (Agent: If yes, when and what did the pharmacy advise?)  This is the patient's preferred pharmacy:  [facility] [address], [address] [place] [address] Phone: [phone] Fax: [phone]  Is this the correct pharmacy for this prescription? Yes If no, delete pharmacy and type the correct one.   Has the prescription been filled recently? Yes  Is the patient out of the medication? Yes  Has the patient been seen for an appointment in the last year OR does the patient have an upcoming appointment? Yes  Can we respond through MyChart? Yes  Agent: Please be advised that Rx refills may take up to 3 business days. We ask that you follow-up with your pharmacy.  **Pt req refill to get her to her next appt in office.

## 2023-08-14 ENCOUNTER — Other Ambulatory Visit (HOSPITAL_COMMUNITY): Payer: Self-pay

## 2023-08-14 MED ORDER — LOSARTAN POTASSIUM-HCTZ 100-25 MG PO TABS
1.0000 | ORAL_TABLET | Freq: Every day | ORAL | 0 refills | Status: DC
Start: 2023-08-14 — End: 2023-09-08
  Filled 2023-08-14: qty 30, 30d supply, fill #0

## 2023-08-14 MED ORDER — AMLODIPINE BESYLATE 5 MG PO TABS
5.0000 mg | ORAL_TABLET | Freq: Every day | ORAL | 0 refills | Status: DC
  Filled 2023-08-14: qty 30, 30d supply, fill #0

## 2023-08-14 NOTE — Telephone Encounter (Signed)
 Requested Prescriptions  Pending Prescriptions Disp Refills   amLODipine  (NORVASC ) 5 MG tablet 30 tablet 0    Sig: Take 1 tablet (5 mg total) by mouth daily.     Cardiovascular: Calcium  Channel Blockers 2 Failed - 08/14/2023  2:04 PM      Failed - Valid encounter within last 6 months    Recent Outpatient Visits           7 months ago Type 2 diabetes mellitus with other specified complication, without long-term current use of insulin (HCC)   [facility] [place]. [facility] Joaquin Mulberry, MD   1 year ago Type 2 diabetes mellitus with other specified complication, without long-term current use of insulin (HCC)   [facility] [facility] [place] [facility] Of [place]. [facility] Joaquin Mulberry, MD   1 year ago Hypertension associated with diabetes [facility])   [place] [facility] [place]. [facility] Joaquin Mulberry, MD   2 years ago Essential hypertension   [place] [facility] [place] [facility] Dept Of [place]. [facility] Joaquin Mulberry, MD   2 years ago Need for hepatitis C screening test   [facility] [place] [facility] Dept Of [place]. [facility], [place], [place]              Passed - Last BP in normal range    BP Readings from Last 1 Encounters:  04/27/23 139/87         Passed - Last Heart Rate in normal range    Pulse Readings from Last 1 Encounters:  04/27/23 91          losartan -hydrochlorothiazide  (HYZAAR ) 100-25 MG tablet 30 tablet 0    Sig: Take 1 tablet by mouth daily. Must have office visit for refills     Cardiovascular: ARB + Diuretic Combos Failed - 08/14/2023  2:04 PM      Failed - K in normal range and within 180 days    Potassium  Date Value Ref Range Status  01/11/2023 3.5 3.5 - 5.2 mmol/L Final         Failed - Na in normal range and within 180 days    Sodium  Date Value Ref Range Status  01/11/2023 140 134 - 144 mmol/L Final          Failed - Cr in normal range and within 180 days    Creat  Date Value Ref Range Status  04/29/2015 0.93 0.50 - 1.10 mg/dL Final   Creatinine, Ser  Date Value Ref Range Status  01/11/2023 1.15 (H) 0.57 - 1.00 mg/dL Final         Failed - eGFR is 10 or above and within 180 days    GFR calc Af Amer  Date Value Ref Range Status  05/05/2020 64 >59 mL/min/1.73 Final    Comment:    **In accordance with recommendations from the NKF-ASN Task force,**   [facility] is in the process of updating its eGFR calculation to the   2021 CKD-EPI creatinine equation that estimates kidney function   without a race variable.    GFR calc non Af Amer  Date Value Ref Range Status  05/05/2020 55 (L) >59 mL/min/1.73 Final   GFR  Date Value Ref Range Status  06/09/2011 93.93 >60.00 mL/min Final   eGFR  Date Value Ref Range Status  01/11/2023  56 (L) >59 mL/min/1.73 Final         Failed - Valid encounter within last 6 months    Recent Outpatient Visits           7 months ago Type 2 diabetes mellitus with other specified complication, without long-term current use of insulin (HCC)   [place] [facility] [place]. [facility] Joaquin Mulberry, MD   1 year ago Type 2 diabetes mellitus with other specified complication, without long-term current use of insulin (HCC)   [place] [facility] [place] [facility] Dept Of [place]. [facility] Joaquin Mulberry, MD   1 year ago Hypertension associated with diabetes [facility])   [place] [facility] Dept Of [place]. [facility] Joaquin Mulberry, MD   2 years ago Essential hypertension    [facility] [place] - [facility] Dept Of [place]. [facility] Joaquin Mulberry, MD   2 years ago Need for hepatitis C screening test   [facility] [place] - [facility] Dept Of [place]. [facility] [place], [place], [place]              [id number] - Patient is not pregnant      Passed - Last BP in  normal range    BP Readings from Last 1 Encounters:  04/27/23 139/87

## 2023-08-18 ENCOUNTER — Other Ambulatory Visit: Payer: Self-pay | Admitting: Family Medicine

## 2023-08-18 ENCOUNTER — Other Ambulatory Visit (HOSPITAL_COMMUNITY): Payer: Self-pay

## 2023-08-18 ENCOUNTER — Other Ambulatory Visit: Payer: Self-pay

## 2023-08-18 DIAGNOSIS — E1159 Type 2 diabetes mellitus with other circulatory complications: Secondary | ICD-10-CM

## 2023-08-18 DIAGNOSIS — N951 Menopausal and female climacteric states: Secondary | ICD-10-CM

## 2023-08-18 DIAGNOSIS — J302 Other seasonal allergic rhinitis: Secondary | ICD-10-CM

## 2023-08-18 DIAGNOSIS — E876 Hypokalemia: Secondary | ICD-10-CM

## 2023-08-18 DIAGNOSIS — E1169 Type 2 diabetes mellitus with other specified complication: Secondary | ICD-10-CM

## 2023-08-18 DIAGNOSIS — J328 Other chronic sinusitis: Secondary | ICD-10-CM

## 2023-08-18 DIAGNOSIS — K219 Gastro-esophageal reflux disease without esophagitis: Secondary | ICD-10-CM

## 2023-08-22 ENCOUNTER — Other Ambulatory Visit: Payer: Self-pay

## 2023-08-22 ENCOUNTER — Encounter: Payer: Self-pay | Admitting: Pharmacist

## 2023-08-25 ENCOUNTER — Other Ambulatory Visit: Payer: Self-pay

## 2023-08-25 ENCOUNTER — Other Ambulatory Visit (HOSPITAL_COMMUNITY): Payer: Self-pay

## 2023-08-30 ENCOUNTER — Other Ambulatory Visit: Payer: Self-pay

## 2023-09-05 ENCOUNTER — Ambulatory Visit: Attending: Family Medicine

## 2023-09-08 ENCOUNTER — Other Ambulatory Visit: Payer: Self-pay

## 2023-09-08 ENCOUNTER — Ambulatory Visit: Attending: Physician Assistant | Admitting: Physician Assistant

## 2023-09-08 VITALS — BP 117/82 | HR 87 | Ht 61.0 in | Wt 187.0 lb

## 2023-09-08 DIAGNOSIS — M25511 Pain in right shoulder: Secondary | ICD-10-CM

## 2023-09-08 DIAGNOSIS — M25512 Pain in left shoulder: Secondary | ICD-10-CM

## 2023-09-08 DIAGNOSIS — I10 Essential (primary) hypertension: Secondary | ICD-10-CM | POA: Diagnosis not present

## 2023-09-08 DIAGNOSIS — E1165 Type 2 diabetes mellitus with hyperglycemia: Secondary | ICD-10-CM | POA: Diagnosis not present

## 2023-09-08 DIAGNOSIS — Z7984 Long term (current) use of oral hypoglycemic drugs: Secondary | ICD-10-CM

## 2023-09-08 DIAGNOSIS — E876 Hypokalemia: Secondary | ICD-10-CM

## 2023-09-08 DIAGNOSIS — F419 Anxiety disorder, unspecified: Secondary | ICD-10-CM

## 2023-09-08 DIAGNOSIS — G8929 Other chronic pain: Secondary | ICD-10-CM

## 2023-09-08 DIAGNOSIS — Z7985 Long-term (current) use of injectable non-insulin antidiabetic drugs: Secondary | ICD-10-CM | POA: Diagnosis not present

## 2023-09-08 DIAGNOSIS — F5101 Primary insomnia: Secondary | ICD-10-CM

## 2023-09-08 DIAGNOSIS — K0889 Other specified disorders of teeth and supporting structures: Secondary | ICD-10-CM

## 2023-09-08 DIAGNOSIS — M79672 Pain in left foot: Secondary | ICD-10-CM

## 2023-09-08 DIAGNOSIS — E1159 Type 2 diabetes mellitus with other circulatory complications: Secondary | ICD-10-CM

## 2023-09-08 DIAGNOSIS — N951 Menopausal and female climacteric states: Secondary | ICD-10-CM

## 2023-09-08 LAB — GLUCOSE, POCT (MANUAL RESULT ENTRY): POC Glucose: 164 mg/dL — AB (ref 70–99)

## 2023-09-08 LAB — POCT GLYCOSYLATED HEMOGLOBIN (HGB A1C): HbA1c, POC (controlled diabetic range): 6 % (ref 0.0–7.0)

## 2023-09-08 MED ORDER — CARVEDILOL 25 MG PO TABS
25.0000 mg | ORAL_TABLET | Freq: Two times a day (BID) | ORAL | 3 refills | Status: DC
  Filled 2023-09-08: qty 60, 30d supply, fill #0
  Filled 2023-10-13 (×2): qty 60, 30d supply, fill #1
  Filled 2023-11-16: qty 60, 30d supply, fill #2
  Filled 2023-12-20: qty 60, 30d supply, fill #3

## 2023-09-08 MED ORDER — POTASSIUM CHLORIDE CRYS ER 20 MEQ PO TBCR
20.0000 meq | EXTENDED_RELEASE_TABLET | Freq: Every day | ORAL | 0 refills | Status: DC
Start: 2023-09-08 — End: 2023-12-12
  Filled 2023-09-08: qty 30, 30d supply, fill #0

## 2023-09-08 MED ORDER — METHOCARBAMOL 750 MG PO TABS
750.0000 mg | ORAL_TABLET | Freq: Two times a day (BID) | ORAL | 0 refills | Status: AC | PRN
  Filled 2023-09-08: qty 60, 30d supply, fill #0

## 2023-09-08 MED ORDER — MELOXICAM 7.5 MG PO TABS
7.5000 mg | ORAL_TABLET | Freq: Every day | ORAL | 1 refills | Status: AC
  Filled 2023-09-08: qty 30, 30d supply, fill #0
  Filled 2023-10-13 (×2): qty 30, 30d supply, fill #1

## 2023-09-08 MED ORDER — PREGABALIN 75 MG PO CAPS: 75.0000 mg | ORAL_CAPSULE | Freq: Two times a day (BID) | ORAL | 3 refills | Status: AC

## 2023-09-08 MED ORDER — VEOZAH 45 MG PO TABS
1.0000 | ORAL_TABLET | Freq: Every day | ORAL | 1 refills | Status: AC
  Filled 2023-09-08: qty 30, 30d supply, fill #0

## 2023-09-08 MED ORDER — OZEMPIC (0.25 OR 0.5 MG/DOSE) 2 MG/3ML ~~LOC~~ SOPN
0.5000 mg | PEN_INJECTOR | SUBCUTANEOUS | 3 refills | Status: DC
  Filled 2023-09-08: qty 3, fill #0
  Filled 2023-10-02: qty 9, 84d supply, fill #0
  Filled 2023-12-27: qty 3, 28d supply, fill #1

## 2023-09-08 MED ORDER — ROSUVASTATIN CALCIUM 10 MG PO TABS
10.0000 mg | ORAL_TABLET | Freq: Every day | ORAL | 1 refills | Status: DC
Start: 2023-09-08 — End: 2023-12-12
  Filled 2023-09-08: qty 90, 90d supply, fill #0
  Filled 2023-11-16: qty 90, 90d supply, fill #1

## 2023-09-08 MED ORDER — AMLODIPINE BESYLATE 5 MG PO TABS
5.0000 mg | ORAL_TABLET | Freq: Every day | ORAL | 1 refills | Status: DC
  Filled 2023-09-08 – 2023-09-19 (×2): qty 90, 90d supply, fill #0
  Filled 2023-12-12: qty 90, 90d supply, fill #1

## 2023-09-08 MED ORDER — METFORMIN HCL 500 MG PO TABS
500.0000 mg | ORAL_TABLET | Freq: Two times a day (BID) | ORAL | 1 refills | Status: DC
  Filled 2023-09-08 – 2023-11-29 (×3): qty 180, 90d supply, fill #0
  Filled 2024-03-05 – 2024-03-11 (×2): qty 180, 90d supply, fill #1

## 2023-09-08 MED ORDER — PREGABALIN 75 MG PO CAPS
ORAL_CAPSULE | ORAL | 3 refills | Status: AC
  Filled 2023-09-08: qty 60, 30d supply, fill #0
  Filled 2023-10-13 (×2): qty 60, 30d supply, fill #1
  Filled 2024-03-05: qty 60, 30d supply, fill #0

## 2023-09-08 MED ORDER — LOSARTAN POTASSIUM-HCTZ 100-25 MG PO TABS
1.0000 | ORAL_TABLET | Freq: Every day | ORAL | 1 refills | Status: DC
  Filled 2023-09-08 – 2023-11-16 (×2): qty 90, 90d supply, fill #0
  Filled 2024-02-01 (×2): qty 90, 90d supply, fill #1

## 2023-09-08 NOTE — Progress Notes (Signed)
 Patient ID: Brandy [name], female   DOB: [birth year], 58 y.o.   MRN: [id number]   Brandy [name], is a 58 y.o. female  MVH:[id number]  [id number]  DOB - January 02, 1966  Chief Complaint  Patient presents with   Medical Management of Chronic Issues    No concerns       Subjective:   Brandy [name] is a 58 y.o. female here today for RF on meds and wants to get a counselor.  She is followed by psychiatry and last seen 2 months ago.  She has a lot of pressure on her with family members and family stress.  She is asking for something for pain for B shoulder pain.  She has seen Dr Christiane Cowing and knows she needs surgery but is afraid 1-she won't wake up from anesthesia and 2-no one will take care of her after the surgery.     No problems updated.  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 Authorizing Provider  acetaminophen -codeine  (TYLENOL  #3) 300-30 MG tablet Take 1-2 tablets by mouth every 4 (four) hours as needed for moderate pain. 11/03/22  Yes Persons, Norma Beckers, PA  acetaminophen -codeine  (TYLENOL  #3) 300-30 MG tablet Take 1 tablet by mouth 2 (two) times daily as needed for moderate pain (pain score 4-6). 04/03/23  Yes Sandie Cross, PA-C  albuterol  (VENTOLIN  HFA) 108 (90 Base) MCG/ACT inhaler Inhale 2 puffs into the lungs every 4 (four) hours as needed for wheezing or shortness of breath. 09/16/19  Yes Hall-Potvin, [place], PA-C  aspirin  EC 81 MG EC tablet  Take 1 tablet (81 mg total) by mouth daily. 02/05/15  Yes Deforest Fast, MD  benzonatate  (TESSALON  PERLES) 100 MG capsule Take 1 capsule (100 mg total) by mouth 2 (two) times daily as needed for cough. 02/14/22  Yes Crain, Whitney L, PA  Blood Glucose Monitoring Suppl (ONE TOUCH ULTRA 2) w/Device KIT Use to check blood sugar 3 (three) times daily. 12/19/22  Yes Newlin, Enobong, MD  cetirizine  (ZYRTEC ) 10 MG tablet Take 1 tablet (10 mg total) by mouth daily. 07/18/22  Yes Newlin, Enobong, MD  cloNIDine  (CATAPRES ) 0.1 MG tablet Take 1 tablet (0.1 mg total) by mouth at bedtime as needed for hot flashes. 01/11/23  Yes Newlin, Lavelle Posey, MD  Continuous Glucose Sensor (FREESTYLE LIBRE 3 SENSOR) MISC Place 1 sensor on the skin every 14 days. Use to check glucose continuously 12/23/21  Yes Newlin, Enobong, MD  DULoxetine  (CYMBALTA ) 60 MG capsule Take 1 capsule (60 mg total) by mouth daily. 07/21/23  Yes Arfeen, Bronson Canny, MD  fluticasone  (FLONASE ) 50 MCG/ACT nasal spray Place 1 spray into both nostrils daily. 08/24/22  Yes Newlin, Enobong, MD  furosemide  (LASIX ) 20 MG tablet Take 1 tablet (20 mg total) by mouth daily. 01/11/23  Yes Newlin, Enobong, MD  glucose blood (ACCU-CHEK GUIDE) test strip Use as instructed three times daily 07/18/22  Yes  Newlin, Enobong, MD  guaiFENesin  (MUCINEX ) 600 MG 12 hr tablet Take 1 tablet (600 mg total) by mouth 2 (two) times daily. 02/14/22  Yes Crain, Whitney L, PA  haloperidol  (HALDOL ) 5 MG tablet Take 1 tablet (5 mg total) by mouth 2 (two) times daily. 07/21/23  Yes Arfeen, Bronson Canny, MD  HYDROcodone -acetaminophen  (NORCO) 5-325 MG tablet Take 1 tablet by mouth daily as needed. 12/20/22  Yes Wes Hamman, MD  hydrOXYzine  (VISTARIL ) 25 MG capsule Take 1 capsule (25 mg total) by mouth daily as needed for anxiety. 07/21/23  Yes Arfeen, Bronson Canny, MD  lamoTRIgine  (LAMICTAL ) 150 MG tablet Take 1 tablet (150 mg total) by mouth 2 (two) times daily. 07/21/23  Yes Arfeen, Bronson Canny, MD  Lancets [facility]  DELICA PLUS [place]) MISC use as directed 3 times daily before meals 07/18/22  Yes Newlin, Enobong, MD  nitroGLYCERIN  (NITRODUR - DOSED IN MG/24 HR) 0.2 mg/hr patch Place 1 patch (0.2 mg total) onto the skin daily. Apply patch near the affected area and change location daily. 07/14/21  Yes Reuben Castilla, NP  pantoprazole  (PROTONIX ) 40 MG tablet Take 1 tablet (40 mg total) by mouth daily. 03/06/23  Yes Newlin, Enobong, MD  predniSONE  (DELTASONE ) 10 MG tablet Take 1 tablet (10 mg total) by mouth daily. 07/14/21  Yes Felicity Horseman R, NP  Semaglutide ,0.25 or 0.5MG /DOS, (OZEMPIC , 0.25 OR 0.5 MG/DOSE,) 2 MG/3ML SOPN Inject 0.5 mg into the skin once a week. 09/08/23  Yes Hassie Lint, PA-C  traZODone  (DESYREL ) 100 MG tablet Take 1 tablet (100 mg total) by mouth at bedtime. 07/21/23  Yes Arfeen, Bronson Canny, MD  amLODipine  (NORVASC ) 5 MG tablet Take 1 tablet (5 mg total) by mouth daily. 09/08/23   Hassie Lint, PA-C  carvedilol  (COREG ) 25 MG tablet Take 1 tablet (25 mg total) by mouth 2 (two) times daily with a meal. 09/08/23   Rocquel Askren, Stan Eans, PA-C  Fezolinetant  (VEOZAH ) 45 MG TABS Take 1 tablet (45 mg total) by mouth daily. For hot flashes 09/08/23   Hassie Lint, PA-C  losartan -hydrochlorothiazide  (HYZAAR ) 100-25 MG tablet Take 1 tablet by mouth daily. 09/08/23   Hassie Lint, PA-C  meloxicam  (MOBIC ) 7.5 MG tablet Take 1 tablet (7.5 mg total) by mouth daily. 09/08/23   Hassie Lint, PA-C  metFORMIN  (GLUCOPHAGE ) 500 MG tablet Take 1 tablet (500 mg total) by mouth 2 (two) times daily with a meal. 09/08/23   Jarrid Lienhard, Stan Eans, PA-C  methocarbamol  (ROBAXIN -750) 750 MG tablet Take 1 tablet (750 mg total) by mouth 2 (two) times daily as needed for muscle spasms. 09/08/23   Hassie Lint, PA-C  potassium chloride  SA (KLOR-CON  M) 20 MEQ tablet Take 1 tablet (20 mEq total) by mouth daily. **need office visit for refills** 09/08/23   Hassie Lint, PA-C  pregabalin  (LYRICA ) 75 MG capsule Take 1 capsule (75  mg total) by mouth 2 (two) times daily. 09/08/23   Hassie Lint, PA-C  rosuvastatin  (CRESTOR ) 10 MG tablet Take 1 tablet (10 mg total) by mouth daily. 09/08/23   Hassie Lint, PA-C  losartan  (COZAAR ) 100 MG tablet Take 1 tablet (100 mg total) by mouth daily. 03/16/20 05/05/20  Joaquin Mulberry, MD    ROS: Neg HEENT Neg resp Neg cardiac Neg GI Neg GU Neg psych Neg neuro  Objective:   Vitals:   09/08/23 1615  BP: 117/82  Pulse: 87  SpO2: 97%  Weight: 187 lb (84.8 kg)  Height: 5\' 1"  (  1.549 m)   Exam General appearance : Awake, alert, not in any distress. Speech Clear. Not toxic looking HEENT: Atraumatic and Normocephalic Neck: Supple, no JVD. No cervical lymphadenopathy.  Chest: Good air entry bilaterally, CTAB.  No rales/rhonchi/wheezing CVS: S1 S2 regular, no murmurs.  Extremities: B/L Lower Ext shows no edema, both legs are warm to touch Neurology: Awake alert, and oriented X 3, CN II-XII intact, Non focal Skin: No Rash Tearful at times when discussing family stress/husband on dialysis, etc.    Data Review Lab Results  Component Value Date   HGBA1C 6.0 09/08/2023   HGBA1C 6.6 01/11/2023   HGBA1C 7.0 07/18/2022    Assessment & Plan   1. Type 2 diabetes mellitus with hyperglycemia, unspecified whether long term insulin use (HCC) (Primary) At goal!!  She does want to go up on ozempic  to help with appetite which should be fine - Glucose (CBG) - HgB A1c - Semaglutide ,0.25 or 0.5MG /DOS, (OZEMPIC , 0.25 OR 0.5 MG/DOSE,) 2 MG/3ML SOPN; Inject 0.5 mg into the skin once a week.  Dispense: 3 mL; Refill: 3 - metFORMIN  (GLUCOPHAGE ) 500 MG tablet; Take 1 tablet (500 mg total) by mouth 2 (two) times daily with a meal.  Dispense: 180 tablet; Refill: 1 - CMP14+EGFR - Ambulatory referral to Ophthalmology - Comprehensive metabolic panel - Lipid Panel  2. Anxiety Continue current meds and follow with Psych - Ambulatory referral to Psychology  3. Primary insomnia Continue  follow up with psych and psychmeds - Ambulatory referral to Psychology  4. Hypertension associated with diabetes (HCC) - amLODipine  (NORVASC ) 5 MG tablet; Take 1 tablet (5 mg total) by mouth daily.  Dispense: 90 tablet; Refill: 1 - carvedilol  (COREG ) 25 MG tablet; Take 1 tablet (25 mg total) by mouth 2 (two) times daily with a meal.  Dispense: 60 tablet; Refill: 3 - losartan -hydrochlorothiazide  (HYZAAR ) 100-25 MG tablet; Take 1 tablet by mouth daily.  Dispense: 90 tablet; Refill: 1 - CMP14+EGFR  5. Vasomotor symptoms due to menopause Clonidine  did not work.  Says she never tried veozah  but it was on her med list from last year - Fezolinetant  (VEOZAH ) 45 MG TABS; Take 1 tablet (45 mg total) by mouth daily. For hot flashes  Dispense: 90 tablet; Refill: 1  6. Left foot pain - meloxicam  (MOBIC ) 7.5 MG tablet; Take 1 tablet (7.5 mg total) by mouth daily.  Dispense: 30 tablet; Refill: 1   8. Hypokalemia - potassium chloride  SA (KLOR-CON  M) 20 MEQ tablet; Take 1 tablet (20 mEq total) by mouth daily. **need office visit for refills**  Dispense: 30 tablet; Refill: 0  9. Tooth pain - Ambulatory referral to Dentistry  10. Chronic pain of both shoulders Followed by Dr Christiane Cowing - pregabalin  (LYRICA ) 75 MG capsule; Take 1 capsule (75 mg total) by mouth 2 (two) times daily.  Dispense: 60 capsule; Refill: 3 - meloxicam  (MOBIC ) 7.5 MG tablet; Take 1 tablet (7.5 mg total) by mouth daily.  Dispense: 30 tablet; Refill: 1 - methocarbamol  (ROBAXIN -750) 750 MG tablet; Take 1 tablet (750 mg total) by mouth 2 (two) times daily as needed for muscle spasms.  Dispense: 60 tablet; Refill: 0    Return in about 3 months (around 12/09/2023) for PCP for chronic conditions.  The patient was given clear instructions to go to ER or return to medical center if symptoms don't improve, worsen or new problems develop. The patient verbalized understanding. The patient was told to call to get lab results if they haven't heard  anything in the  next week.      Dulce Gibbs, PA-C [facility] [place], [place] [phone]   09/08/2023, 4:48 PM

## 2023-09-09 LAB — CMP14+EGFR
ALT: 15 IU/L (ref 0–32)
AST: 17 IU/L (ref 0–40)
Albumin: 4.4 g/dL (ref 3.8–4.9)
Alkaline Phosphatase: 80 IU/L (ref 44–121)
BUN/Creatinine Ratio: 13 (ref 9–23)
BUN: 11 mg/dL (ref 6–24)
Bilirubin Total: <0.2 mg/dL (ref 0.0–1.2)
CO2: 25 mmol/L (ref 20–29)
Calcium: 10 mg/dL (ref 8.7–10.2)
Chloride: 99 mmol/L (ref 96–106)
Creatinine, Ser: 0.82 mg/dL (ref 0.57–1.00)
Globulin, Total: 2.8 g/dL (ref 1.5–4.5)
Glucose: 141 mg/dL — ABNORMAL HIGH (ref 70–99)
Potassium: 4.1 mmol/L (ref 3.5–5.2)
Sodium: 139 mmol/L (ref 134–144)
Total Protein: 7.2 g/dL (ref 6.0–8.5)
eGFR: 83 mL/min/{1.73_m2} (ref >59)

## 2023-09-09 LAB — LIPID PANEL
Chol/HDL Ratio: 2.1 ratio (ref 0.0–4.4)
Cholesterol, Total: 110 mg/dL (ref 100–199)
HDL: 53 mg/dL (ref >39)
LDL Chol Calc (NIH): 34 mg/dL (ref 0–99)
Triglycerides: 132 mg/dL (ref 0–149)
VLDL Cholesterol Cal: 23 mg/dL (ref 5–40)

## 2023-09-12 ENCOUNTER — Ambulatory Visit: Payer: Self-pay | Admitting: Physician Assistant

## 2023-09-12 ENCOUNTER — Ambulatory Visit: Attending: Family Medicine

## 2023-09-12 DIAGNOSIS — Z1231 Encounter for screening mammogram for malignant neoplasm of breast: Secondary | ICD-10-CM

## 2023-09-12 DIAGNOSIS — Z0000 Encounter for general adult medical examination without abnormal findings: Secondary | ICD-10-CM

## 2023-09-12 NOTE — Progress Notes (Signed)
 Because this visit was a virtual/telehealth visit,  certain criteria was not obtained, such a blood pressure, CBG if applicable, and timed get up and go. Any medications not marked as "taking" were not mentioned during the medication reconciliation part of the visit. Any vitals not documented were not able to be obtained due to this being a telehealth visit or patient was unable to self-report a recent blood pressure reading due to a lack of equipment at home via telehealth. Vitals that have been documented are verbally provided by the patient.   Subjective:   Brandy [name] is a 58 y.o. who presents for a Medicare Wellness preventive visit.  As a reminder, Annual Wellness Visits don't include a physical exam, and some assessments may be limited, especially if this visit is performed virtually. We may recommend an in-person follow-up visit with your provider if needed.  Visit Complete: Virtual I connected with  Brandy [name] on 09/12/23 by a audio enabled telemedicine application and verified that I am speaking with the correct person using two identifiers.  Patient Location: Home  Provider Location: Office/Clinic  I discussed the limitations of evaluation and management by telemedicine. The patient expressed understanding and agreed to proceed.  Vital Signs: Because this visit was a virtual/telehealth visit, some criteria may be missing or patient reported. Any vitals not documented were not able to be obtained and vitals that have been documented are patient reported.  VideoDeclined- This patient declined Librarian, academic. Therefore the visit was completed with audio only.  Persons Participating in Visit: Patient.  AWV Questionnaire: No: Patient Medicare AWV questionnaire was not completed prior to this visit.  Cardiac Risk Factors include: advanced age (>10men, >23 women);sedentary lifestyle;dyslipidemia;family history of premature cardiovascular  disease;hypertension;obesity (BMI >30kg/m2)     Objective:     Today's Vitals   09/12/23 1035  PainSc: 7   PainLoc: Hip   There is no height or weight on file to calculate BMI.     09/12/2023   10:38 AM 04/20/2023    3:42 PM 08/24/2022    8:32 PM 04/13/2021    3:08 PM 10/24/2016    9:52 AM 02/24/2016    2:51 PM 11/16/2015    3:21 PM  Advanced Directives  Does Patient Have a Medical Advance Directive? No No No No No No No  Would patient like information on creating a medical advance directive? No - Patient declined No - Patient declined Yes (MAU/Ambulatory/Procedural Areas - Information given) Yes (Inpatient - patient defers creating a medical advance directive at this time - Information given)   No - patient declined information    Current Medications (verified) Outpatient Encounter Medications as of 09/12/2023  Medication Sig   acetaminophen -codeine  (TYLENOL  #3) 300-30 MG tablet Take 1-2 tablets by mouth every 4 (four) hours as needed for moderate pain.   acetaminophen -codeine  (TYLENOL  #3) 300-30 MG tablet Take 1 tablet by mouth 2 (two) times daily as needed for moderate pain (pain score 4-6).   albuterol  (VENTOLIN  HFA) 108 (90 Base) MCG/ACT inhaler Inhale 2 puffs into the lungs every 4 (four) hours as needed for wheezing or shortness of breath.   amLODipine  (NORVASC ) 5 MG tablet Take 1 tablet (5 mg total) by mouth daily.   aspirin  EC 81 MG EC tablet Take 1 tablet (81 mg total) by mouth daily.   benzonatate  (TESSALON  PERLES) 100 MG capsule Take 1 capsule (100 mg total) by mouth 2 (two) times daily as needed for cough. (Patient not taking:  Reported on 09/12/2023)   Blood Glucose Monitoring Suppl (ONE TOUCH ULTRA 2) w/Device KIT Use to check blood sugar 3 (three) times daily.   carvedilol  (COREG ) 25 MG tablet Take 1 tablet (25 mg total) by mouth 2 (two) times daily with a meal.   cetirizine  (ZYRTEC ) 10 MG tablet Take 1 tablet (10 mg total) by mouth daily.   cloNIDine  (CATAPRES ) 0.1 MG  tablet Take 1 tablet (0.1 mg total) by mouth at bedtime as needed for hot flashes.   Continuous Glucose Sensor (FREESTYLE LIBRE 3 SENSOR) MISC Place 1 sensor on the skin every 14 days. Use to check glucose continuously   DULoxetine  (CYMBALTA ) 60 MG capsule Take 1 capsule (60 mg total) by mouth daily.   Fezolinetant  (VEOZAH ) 45 MG TABS Take 1 tablet (45 mg total) by mouth daily. For hot flashes   fluticasone  (FLONASE ) 50 MCG/ACT nasal spray Place 1 spray into both nostrils daily.   furosemide  (LASIX ) 20 MG tablet Take 1 tablet (20 mg total) by mouth daily.   glucose blood (ACCU-CHEK GUIDE) test strip Use as instructed three times daily   guaiFENesin  (MUCINEX ) 600 MG 12 hr tablet Take 1 tablet (600 mg total) by mouth 2 (two) times daily. (Patient not taking: Reported on 09/12/2023)   haloperidol  (HALDOL ) 5 MG tablet Take 1 tablet (5 mg total) by mouth 2 (two) times daily.   HYDROcodone -acetaminophen  (NORCO) 5-325 MG tablet Take 1 tablet by mouth daily as needed.   hydrOXYzine  (VISTARIL ) 25 MG capsule Take 1 capsule (25 mg total) by mouth daily as needed for anxiety.   lamoTRIgine  (LAMICTAL ) 150 MG tablet Take 1 tablet (150 mg total) by mouth 2 (two) times daily.   Lancets (ONETOUCH DELICA PLUS LANCET33G) MISC use as directed 3 times daily before meals   losartan -hydrochlorothiazide  (HYZAAR ) 100-25 MG tablet Take 1 tablet by mouth daily.   meloxicam  (MOBIC ) 7.5 MG tablet Take 1 tablet (7.5 mg total) by mouth daily.   metFORMIN  (GLUCOPHAGE ) 500 MG tablet Take 1 tablet (500 mg total) by mouth 2 (two) times daily with a meal.   methocarbamol  (ROBAXIN ) 750 MG tablet Take 1 tablet (750 mg total) by mouth 2 (two) times daily as needed for muscle spasms.   nitroGLYCERIN  (NITRODUR - DOSED IN MG/24 HR) 0.2 mg/hr patch Place 1 patch (0.2 mg total) onto the skin daily. Apply patch near the affected area and change location daily.   pantoprazole  (PROTONIX ) 40 MG tablet Take 1 tablet (40 mg total) by mouth daily.    potassium chloride  SA (KLOR-CON  M) 20 MEQ tablet Take 1 tablet (20 mEq total) by mouth daily. **need office visit for refills**   predniSONE  (DELTASONE ) 10 MG tablet Take 1 tablet (10 mg total) by mouth daily.   pregabalin  (LYRICA ) 75 MG capsule Take 1 capsule (75 mg total) by mouth 2 (two) times daily.   pregabalin  (LYRICA ) 75 MG capsule Take 1 capsule by mouth 2 times daily   rosuvastatin  (CRESTOR ) 10 MG tablet Take 1 tablet (10 mg total) by mouth daily.   Semaglutide ,0.25 or 0.5MG /DOS, (OZEMPIC , 0.25 OR 0.5 MG/DOSE,) 2 MG/3ML SOPN Inject 0.5 mg into the skin once a week.   traZODone  (DESYREL ) 100 MG tablet Take 1 tablet (100 mg total) by mouth at bedtime.   [DISCONTINUED] losartan  (COZAAR ) 100 MG tablet Take 1 tablet (100 mg total) by mouth daily.   No facility-administered encounter medications on file as of 09/12/2023.    Allergies (verified) Azithromycin, Contrast media [iodinated contrast media], and Shellfish allergy  History: Past Medical History:  Diagnosis Date   Allergy    Anxiety    Depression    Diverticulitis    Edema    Gallstones    GERD (gastroesophageal reflux disease)    Hyperlipidemia    Hypertension    Osteoarthritis    Past Surgical History:  Procedure Laterality Date   GALLBLADDER SURGERY     Family History  Problem Relation Age of Onset   Alcohol abuse Mother    Hypertension Father    Diabetes Sister    Schizophrenia Maternal Aunt    Breast cancer Paternal Aunt        6s   Schizophrenia Maternal Grandmother    Coronary artery disease Paternal Grandmother        young age   Diabetes Paternal Grandmother    Colon cancer Neg Hx    Esophageal cancer Neg Hx    Stomach cancer Neg Hx    Rectal cancer Neg Hx    Social History   Socioeconomic History   Marital status: Married    Spouse name: Not on file   Number of children: 3   Years of education: Not on file   Highest education level: Not on file  Occupational History   Occupation:  Curator: [employer]  Tobacco Use   Smoking status: Every Day    Current packs/day: 0.50    Average packs/day: 0.5 packs/day for 10.0 years (5.0 ttl pk-yrs)    Types: Cigarettes   Smokeless tobacco: Never  Vaping Use   Vaping status: Never Used  Substance and Sexual Activity   Alcohol use: Yes    Alcohol/week: 3.0 standard drinks of alcohol    Types: 1 Glasses of wine, 2 Cans of beer per week   Drug use: Yes    Types: Marijuana   Sexual activity: Yes    Partners: Male    Birth control/protection: None  Other Topics Concern   Not on file  Social History Narrative   Not on file   Social Drivers of Health   Financial Resource Strain: Low Risk  (09/12/2023)   Overall Financial Resource Strain (CARDIA)    Difficulty of Paying Living Expenses: Not hard at all  Food Insecurity: No Food Insecurity (09/12/2023)   Hunger Vital Sign    Worried About Running Out of Food in the Last Year: Never true    Ran Out of Food in the Last Year: Never true  Transportation Needs: No Transportation Needs (09/12/2023)   PRAPARE - Administrator, Civil Service (Medical): No    Lack of Transportation (Non-Medical): No  Physical Activity: Inactive (09/12/2023)   Exercise Vital Sign    Days of Exercise per Week: 0 days    Minutes of Exercise per Session: 0 min  Stress: No Stress Concern Present (09/12/2023)   [employer] of Occupational Health - Occupational Stress Questionnaire    Feeling of Stress : Not at all  Social Connections: Moderately Isolated (09/12/2023)   Social Connection and Isolation Panel [NHANES]    Frequency of Communication with Friends and Family: More than three times a week    Frequency of Social Gatherings with Friends and Family: Three times a week    Attends Religious Services: Never    Active Member of Clubs or Organizations: No    Attends Banker Meetings: Never    Marital Status: Married    Tobacco Counseling Ready to quit:  Not Answered Counseling given: Not  Answered    Clinical Intake:  Pre-visit preparation completed: Yes  Pain : 0-10 Pain Score: 7  Pain Type: Acute pain, Chronic pain, Neuropathic pain     Nutritional Risks: None Diabetes: Yes CBG done?: No Did pt. bring in CBG monitor from home?: No  Lab Results  Component Value Date   HGBA1C 6.0 09/08/2023   HGBA1C 6.6 01/11/2023   HGBA1C 7.0 07/18/2022     How often do you need to have someone help you when you read instructions, pamphlets, or other written materials from your doctor or pharmacy?: 1 - Never What is the last grade level you completed in school?: HSG  Interpreter Needed?: No  Information entered by :: Druscilla Gerhard, LPN.   Activities of Daily Living     09/12/2023   10:38 AM  In your present state of health, do you have any difficulty performing the following activities:  Hearing? 0  Vision? 0  Difficulty concentrating or making decisions? 1  Walking or climbing stairs? 0  Dressing or bathing? 0  Doing errands, shopping? 0  Preparing Food and eating ? N  Using the Toilet? N  In the past six months, have you accidently leaked urine? N  Do you have problems with loss of bowel control? N  Managing your Medications? N  Managing your Finances? N  Housekeeping or managing your Housekeeping? N    Patient Care Team: Joaquin Mulberry, MD as PCP - General (Family Medicine) Carlos Chesterfield Bronson Canny, MD as Consulting Physician (Psychiatry)  I have updated your Care Teams any recent Medical Services you may have received from other providers in the past year.     Assessment:    This is a routine wellness examination for Ceri.  Hearing/Vision screen Hearing Screening - Comments:: Denies hearing difficulties.  Vision Screening - Comments:: Wears rx glasses - not up to date with routine eye exams.    Goals Addressed             This Visit's Progress    Patient Stated: No goals at this time.          Depression Screen     09/12/2023   10:38 AM 07/21/2023    9:07 AM 04/27/2023    2:57 PM 08/24/2022    8:29 PM 07/18/2022    3:40 PM 07/05/2022    9:51 AM 01/10/2022    1:50 PM  PHQ 2/9 Scores  PHQ - 2 Score 6  4 6 6     PHQ- 9 Score 15  18 25 27        Information is confidential and restricted. Go to Review Flowsheets to unlock data.    Fall Risk     09/12/2023   10:38 AM 04/27/2023    2:57 PM 01/11/2023    4:01 PM 08/24/2022    8:31 PM 07/18/2022    3:40 PM  Fall Risk   Falls in the past year? 0 0 0 1 0  Number falls in past yr: 0  0 0 0  Injury with Fall? 0  0 0 0  Risk for fall due to : No Fall Risks  No Fall Risks Impaired balance/gait;Impaired mobility No Fall Risks  Follow up Falls evaluation completed  Falls evaluation completed Education provided;Falls prevention discussed;Falls evaluation completed     MEDICARE RISK AT HOME:  Medicare Risk at Home Any stairs in or around the home?: No If so, are there any without handrails?: No Home free of loose throw rugs in walkways, pet  beds, electrical cords, etc?: Yes Adequate lighting in your home to reduce risk of falls?: Yes Life alert?: No Use of a cane, walker or w/c?: No Grab bars in the bathroom?: No Shower chair or bench in shower?: No Elevated toilet seat or a handicapped toilet?: No  TIMED UP AND GO:  Was the test performed?  No  Cognitive Function: 6CIT completed    09/12/2023   10:40 AM 04/13/2021    3:18 PM  MMSE - Mini Mental State Exam  Not completed: Unable to complete   Orientation to time  5  Orientation to Place  5  Registration  3  Attention/ Calculation  0  Recall  3  Language- name 2 objects  2  Language- repeat  1  Language- follow 3 step command  3  Language- read & follow direction  1  Write a sentence  1  Copy design  1  Total score  25        09/12/2023   10:36 AM 08/24/2022    8:31 PM  6CIT Screen  What Year? 0 points 0 points  What month? 0 points 0 points  What time? 0 points 0  points  Count back from 20 0 points 0 points  Months in reverse 0 points 0 points  Repeat phrase 0 points 2 points  Total Score 0 points 2 points    Immunizations Immunization History  Administered Date(s) Administered   Influenza, Seasonal, Injecte, Preservative Fre 01/11/2023   Influenza,inj,Quad PF,6+ Mos 12/22/2021   Moderna Sars-Covid-2 Vaccination 04/03/2020   PFIZER(Purple Top)SARS-COV-2 Vaccination 04/19/2019, 05/10/2019   Tdap 05/18/2021    Screening Tests Health Maintenance  Topic Date Due   OPHTHALMOLOGY EXAM  Never done   HIV Screening  Never done   Diabetic kidney evaluation - Urine ACR  Never done   Pneumococcal Vaccine 64-53 Years old (1 of 2 - PCV) Never done   Zoster Vaccines- Shingrix (1 of 2) Never done   Cervical Cancer Screening (HPV/Pap Cotest)  Never done   COVID-19 Vaccine (4 - 2024-25 season) 12/04/2022   MAMMOGRAM  07/17/2023   FOOT EXAM  07/18/2023   INFLUENZA VACCINE  11/03/2023   HEMOGLOBIN A1C  03/09/2024   Diabetic kidney evaluation - eGFR measurement  09/07/2024   Medicare Annual Wellness (AWV)  09/11/2024   DTaP/Tdap/Td (2 - Td or Tdap) 05/19/2031   Colonoscopy  05/29/2031   Hepatitis C Screening  Completed   HPV VACCINES  Aged Out   Meningococcal B Vaccine  Aged Out    Health Maintenance  Health Maintenance Due  Topic Date Due   OPHTHALMOLOGY EXAM  Never done   HIV Screening  Never done   Diabetic kidney evaluation - Urine ACR  Never done   Pneumococcal Vaccine 51-70 Years old (1 of 2 - PCV) Never done   Zoster Vaccines- Shingrix (1 of 2) Never done   Cervical Cancer Screening (HPV/Pap Cotest)  Never done   COVID-19 Vaccine (4 - 2024-25 season) 12/04/2022   MAMMOGRAM  07/17/2023   FOOT EXAM  07/18/2023   Health Maintenance Items Addressed: Yes Mammogram ordered, Diabetic Foot Exam recommended  Additional Screening:  Vision Screening: Recommended annual ophthalmology exams for early detection of glaucoma and other disorders  of the eye. Would you like a referral to an eye doctor? Yes    Dental Screening: Recommended annual dental exams for proper oral hygiene  Community Resource Referral / Chronic Care Management: CRR required this visit?  No   CCM  required this visit?  No   Plan:    I have personally reviewed and noted the following in the patient's chart:   Medical and social history Use of alcohol, tobacco or illicit drugs  Current medications and supplements including opioid prescriptions. Patient is currently taking opioid prescriptions. Information provided to patient regarding non-opioid alternatives. Patient advised to discuss non-opioid treatment plan with their provider. Functional ability and status Nutritional status Physical activity Advanced directives List of other physicians Hospitalizations, surgeries, and ER visits in previous 12 months Vitals Screenings to include cognitive, depression, and falls Referrals and appointments  In addition, I have reviewed and discussed with patient certain preventive protocols, quality metrics, and best practice recommendations. A written personalized care plan for preventive services as well as general preventive health recommendations were provided to patient.   Margette Sheldon, LPN   10/01/5282   After Visit Summary: (MyChart) Due to this being a telephonic visit, the after visit summary with patients personalized plan was offered to patient via MyChart   Nurse Notes: Mammogram ordered, Diabetic Foot Exam recommended.  Patient saw PA last week and was ordered eye exam, psychology referral and dental consult. This nurse completed Depression screening and the patient is not having any SUICIDE IDEATION based on score of PHQ-2 and PHQ-9.

## 2023-09-12 NOTE — Patient Instructions (Addendum)
 Brandy [name] , Thank you for taking time out of your busy schedule to complete your Annual Wellness Visit with me. I enjoyed our conversation and look forward to speaking with you again next year. I, as well as your care team,  appreciate your ongoing commitment to your health goals. Please review the following plan we discussed and let me know if I can assist you in the future. Your Game plan/ To Do List    Referrals: If you haven't heard from the office you've been referred to, please reach out to them at the phone provided.  [facility] [address] [place], [place] [address]  Phone [phone] Appointment for screening mammogram  Follow up Visits: Next Medicare AWV with our clinical staff: 09/17/2024 at 10:30 phone visit with Nurse Health Advisor   Have you seen your provider in the last 6 months (3 months if uncontrolled diabetes)? Yes Next Office Visit with your provider: 12/12/2023 at 1:30 a.m. office visit with Dr. Adan Holms  Clinician Recommendations:  Aim for 30 minutes of exercise or brisk walking, 6-8 glasses of water, and 5 servings of fruits and vegetables each day.       This is a list of the screening recommended for you and due dates:  Health Maintenance  Topic Date Due   Eye exam for diabetics  Never done   HIV Screening  Never done   Yearly kidney health urinalysis for diabetes  Never done   Pneumococcal Vaccination (1 of 2 - PCV) Never done   Zoster (Shingles) Vaccine (1 of 2) Never done   Pap with HPV screening  Never done   COVID-19 Vaccine (4 - 2024-25 season) 12/04/2022   Mammogram  07/17/2023   Complete foot exam   07/18/2023   Flu Shot  11/03/2023   Hemoglobin A1C  03/09/2024   Yearly kidney function blood test for diabetes  09/07/2024   Medicare Annual Wellness Visit  09/11/2024   DTaP/Tdap/Td vaccine (2 - Td or Tdap) 05/19/2031   Colon Cancer Screening  05/29/2031   Hepatitis C Screening  Completed   HPV Vaccine  Aged Out    Meningitis B Vaccine  Aged Out    Advanced directives: (Declined) Advance directive discussed with you today. Even though you declined this today, please call our office should you change your mind, and we can give you the proper paperwork for you to fill out. Advance Care Planning is important because it:  [x]  Makes sure you receive the medical care that is consistent with your values, goals, and preferences  [x]  It provides guidance to your family and loved ones and reduces their decisional burden about whether or not they are making the right decisions based on your wishes.  Follow the link provided in your after visit summary or read over the paperwork we have mailed to you to help you started getting your Advance Directives in place. If you need assistance in completing these, please reach out to us  so that we can help you!  See attachments for Preventive Care and Fall Prevention Tips.

## 2023-09-19 ENCOUNTER — Other Ambulatory Visit: Payer: Self-pay | Admitting: Family Medicine

## 2023-09-19 ENCOUNTER — Other Ambulatory Visit: Payer: Self-pay

## 2023-09-19 DIAGNOSIS — K219 Gastro-esophageal reflux disease without esophagitis: Secondary | ICD-10-CM

## 2023-09-19 MED ORDER — PANTOPRAZOLE SODIUM 40 MG PO TBEC
40.0000 mg | DELAYED_RELEASE_TABLET | Freq: Every day | ORAL | 1 refills | Status: DC
  Filled 2023-09-19: qty 90, 90d supply, fill #0
  Filled 2023-12-12: qty 90, 90d supply, fill #1

## 2023-09-27 ENCOUNTER — Other Ambulatory Visit: Payer: Self-pay

## 2023-09-27 ENCOUNTER — Other Ambulatory Visit: Payer: Self-pay | Admitting: Family Medicine

## 2023-09-27 DIAGNOSIS — N951 Menopausal and female climacteric states: Secondary | ICD-10-CM

## 2023-09-27 MED ORDER — CLONIDINE HCL 0.1 MG PO TABS
0.1000 mg | ORAL_TABLET | Freq: Every evening | ORAL | 1 refills | Status: DC | PRN
  Filled 2023-09-27: qty 90, 90d supply, fill #0
  Filled 2023-12-27: qty 90, 90d supply, fill #1

## 2023-10-02 ENCOUNTER — Other Ambulatory Visit: Payer: Self-pay

## 2023-10-03 ENCOUNTER — Other Ambulatory Visit (HOSPITAL_COMMUNITY): Payer: Self-pay

## 2023-10-03 ENCOUNTER — Other Ambulatory Visit: Payer: Self-pay

## 2023-10-03 ENCOUNTER — Ambulatory Visit
Admission: EM | Admit: 2023-10-03 | Discharge: 2023-10-03 | Disposition: A | Discharge status: Home / Self Care | Attending: Family Medicine | Admitting: Family Medicine

## 2023-10-03 DIAGNOSIS — K529 Noninfective gastroenteritis and colitis, unspecified: Secondary | ICD-10-CM | POA: Diagnosis not present

## 2023-10-03 DIAGNOSIS — R112 Nausea with vomiting, unspecified: Secondary | ICD-10-CM | POA: Diagnosis not present

## 2023-10-03 LAB — POCT FASTING CBG KUC MANUAL ENTRY: POCT Glucose (KUC): 120 mg/dL — AB (ref 70–99)

## 2023-10-03 MED ORDER — ONDANSETRON 4 MG PO TBDP
4.0000 mg | ORAL_TABLET | Freq: Three times a day (TID) | ORAL | 0 refills | Status: AC | PRN
  Filled 2023-10-03: qty 10, 4d supply, fill #0

## 2023-10-03 NOTE — ED Triage Notes (Signed)
 This started with nausea/vomiting about 4 days ago, I am very weak, light headed a little, very spacey right now. No cough or runny nose. Voids normal. Stools some constipation recently but took some Prune Juice which helped. Last emesis 06-30, HS.

## 2023-10-03 NOTE — Discharge Instructions (Addendum)
 Your sugar was 120.  Ondansetron  dissolved in the mouth every 8 hours as needed for nausea or vomiting. Clear liquids(water, gatorade/pedialyte, ginger ale/sprite, chicken broth/soup) and bland things(crackers/toast, rice, potato, bananas) to eat. Avoid acidic foods like lemon/lime/orange/tomato, and avoid greasy/spicy foods.   Do not take your metformin  until you can eat more normally.  Please go to the emergency room if you feel worse or cannot keep fluids down.

## 2023-10-03 NOTE — ED Provider Notes (Signed)
 [facility]    CSN: [id number] Arrival date & time: 10/03/23  1240      History   Chief Complaint Chief Complaint  Patient presents with   Emesis   Weakness    HPI Brandy [name] is a 58 y.o. female.    Emesis Weakness Associated symptoms: vomiting   Here for nausea and vomiting and weakness and dizziness.  About 4 days ago she started feeling weak and lightheaded/dizzy.  Then 2 days ago she started having some nausea and vomiting.  She threw up once or twice on June 29 and yesterday she threw up 3 times.  She felt constipated and so took some prune juice and yesterday had more of a normal bowel movement.  No blood in the stool or urine or emesis.  No dysuria and no upper respiratory symptoms.  She is still nauseated this morning but her last emesis was last night at bedtime.  She has managed to keep down some vegetable soup about 6 or 7 hours ago.  She did not take her metformin  this morning  She does not check her sugars at home; her last A1c was 6.0.  She does also take Ozempic .  She is allergic to azithromycin and iodine contrast.  Past Medical History:  Diagnosis Date   Allergy    Anxiety    Depression    Diverticulitis    Edema    Gallstones    GERD (gastroesophageal reflux disease)    Hyperlipidemia    Hypertension    Osteoarthritis     Patient Active Problem List   Diagnosis Date Noted   Type 2 diabetes mellitus (HCC) 12/22/2021   Obesity (BMI 35.0-39.9 without comorbidity) 10/24/2016   Bipolar disorder (HCC) 04/29/2015   Insomnia 03/13/2015   Major depression, recurrent (HCC) 03/10/2015   Generalized anxiety disorder 03/10/2015   Dizziness and giddiness 02/20/2015   GERD (gastroesophageal reflux disease) 02/20/2015   Abnormal CXR    Dyspnea 02/02/2015   Hypoxia 02/02/2015   Acute pulmonary edema (HCC) 02/02/2015   Hypertensive urgency 02/02/2015   Hypertensive crisis    Nausea with vomiting 06/09/2011   HTN (hypertension)  12/02/2010   Diverticulitis of sigmoid colon 12/02/2010   Tobacco dependence 12/02/2010    Past Surgical History:  Procedure Laterality Date   GALLBLADDER SURGERY      OB History   No obstetric history on file.      Home Medications    Prior to Admission medications   Medication Sig Start Date End Date Taking? Authorizing Provider  ondansetron  (ZOFRAN -ODT) 4 MG disintegrating tablet Take 1 tablet (4 mg total) by mouth every 8 (eight) hours as needed for nausea or vomiting. 10/03/23  Yes Vonna Sharlet POUR, MD  acetaminophen -codeine  (TYLENOL  #3) 300-30 MG tablet Take 1-2 tablets by mouth every 4 (four) hours as needed for moderate pain. 11/03/22   Persons, Ronal Dragon, PA  albuterol  (VENTOLIN  HFA) 108 (90 Base) MCG/ACT inhaler Inhale 2 puffs into the lungs every 4 (four) hours as needed for wheezing or shortness of breath. 09/16/19   Hall-Potvin, [place], PA-C  amLODipine  (NORVASC ) 5 MG tablet Take 1 tablet (5 mg total) by mouth daily. 09/08/23   Danton Jon CHRISTELLA, PA-C  aspirin  EC 81 MG EC tablet Take 1 tablet (81 mg total) by mouth daily. 02/05/15   Fairy Frames, MD  Blood Glucose Monitoring Suppl (ONE TOUCH ULTRA 2) w/Device KIT Use to check blood sugar 3 (three) times daily. 12/19/22   Newlin, Enobong, MD  carvedilol  (COREG ) 25 MG tablet Take 1 tablet (25 mg total) by mouth 2 (two) times daily with a meal. 09/08/23   McClung, Jon HERO, PA-C  cetirizine  (ZYRTEC ) 10 MG tablet Take 1 tablet (10 mg total) by mouth daily. 07/18/22   Newlin, Enobong, MD  cloNIDine  (CATAPRES ) 0.1 MG tablet Take 1 tablet (0.1 mg total) by mouth at bedtime as needed for hot flashes. 09/27/23   Newlin, Enobong, MD  Continuous Glucose Sensor (FREESTYLE LIBRE 3 SENSOR) MISC Place 1 sensor on the skin every 14 days. Use to check glucose continuously 12/23/21   Newlin, Enobong, MD  DULoxetine  (CYMBALTA ) 60 MG capsule Take 1 capsule (60 mg total) by mouth daily. 07/21/23   Arfeen, Leni DASEN, MD  Fezolinetant  (VEOZAH ) 45 MG TABS  Take 1 tablet (45 mg total) by mouth daily. For hot flashes 09/08/23   Danton Jon HERO, PA-C  fluticasone  (FLONASE ) 50 MCG/ACT nasal spray Place 1 spray into both nostrils daily. 08/24/22   Newlin, Enobong, MD  furosemide  (LASIX ) 20 MG tablet Take 1 tablet (20 mg total) by mouth daily. 01/11/23   Newlin, Enobong, MD  glucose blood (ACCU-CHEK GUIDE) test strip Use as instructed three times daily 07/18/22   Newlin, Enobong, MD  guaiFENesin  (MUCINEX ) 600 MG 12 hr tablet Take 1 tablet (600 mg total) by mouth 2 (two) times daily. Patient not taking: Reported on 09/12/2023 02/14/22   Lowella Folks L, PA  haloperidol  (HALDOL ) 5 MG tablet Take 1 tablet (5 mg total) by mouth 2 (two) times daily. 07/21/23   Arfeen, Leni DASEN, MD  hydrOXYzine  (VISTARIL ) 25 MG capsule Take 1 capsule (25 mg total) by mouth daily as needed for anxiety. 07/21/23   Arfeen, Leni DASEN, MD  lamoTRIgine  (LAMICTAL ) 150 MG tablet Take 1 tablet (150 mg total) by mouth 2 (two) times daily. 07/21/23   Arfeen, Leni DASEN, MD  Lancets [facility] DELICA PLUS [place]) MISC use as directed 3 times daily before meals 07/18/22   Delbert Clam, MD  losartan -hydrochlorothiazide  (HYZAAR ) 100-25 MG tablet Take 1 tablet by mouth daily. 09/08/23   Danton Jon HERO, PA-C  meloxicam  (MOBIC ) 7.5 MG tablet Take 1 tablet (7.5 mg total) by mouth daily. 09/08/23   Danton Jon HERO, PA-C  metFORMIN  (GLUCOPHAGE ) 500 MG tablet Take 1 tablet (500 mg total) by mouth 2 (two) times daily with a meal. 09/08/23   McClung, Jon HERO, PA-C  methocarbamol  (ROBAXIN ) 750 MG tablet Take 1 tablet (750 mg total) by mouth 2 (two) times daily as needed for muscle spasms. 09/08/23   Danton Jon HERO, PA-C  nitroGLYCERIN  (NITRODUR - DOSED IN MG/24 HR) 0.2 mg/hr patch Place 1 patch (0.2 mg total) onto the skin daily. Apply patch near the affected area and change location daily. 07/14/21   Valdemar Rocky SAUNDERS, NP  pantoprazole  (PROTONIX ) 40 MG tablet Take 1 tablet (40 mg total) by mouth daily. 09/19/23    Newlin, Enobong, MD  potassium chloride  SA (KLOR-CON  M) 20 MEQ tablet Take 1 tablet (20 mEq total) by mouth daily. **need office visit for refills** 09/08/23   Danton Jon HERO, PA-C  pregabalin  (LYRICA ) 75 MG capsule Take 1 capsule (75 mg total) by mouth 2 (two) times daily. 09/08/23   McClung, Angela M, PA-C  pregabalin  (LYRICA ) 75 MG capsule Take 1 capsule by mouth 2 times daily 09/08/23   Danton Jon HERO, PA-C  rosuvastatin  (CRESTOR ) 10 MG tablet Take 1 tablet (10 mg total) by mouth daily. 09/08/23   Danton Jon HERO, PA-C  Semaglutide ,0.25 or 0.5MG /DOS, (OZEMPIC , 0.25 OR 0.5 MG/DOSE,) 2 MG/3ML SOPN Inject 0.5 mg into the skin once a week. 09/08/23   Danton Jon HERO, PA-C  traZODone  (DESYREL ) 100 MG tablet Take 1 tablet (100 mg total) by mouth at bedtime. 07/21/23   Arfeen, Leni DASEN, MD  losartan  (COZAAR ) 100 MG tablet Take 1 tablet (100 mg total) by mouth daily. 03/16/20 05/05/20  Delbert Clam, MD    Family History Family History  Problem Relation Age of Onset   Alcohol abuse Mother    Hypertension Father    Diabetes Sister    Schizophrenia Maternal Aunt    Breast cancer Paternal Aunt        92s   Schizophrenia Maternal Grandmother    Coronary artery disease Paternal Grandmother        young age   Diabetes Paternal Grandmother    Colon cancer Neg Hx    Esophageal cancer Neg Hx    Stomach cancer Neg Hx    Rectal cancer Neg Hx     Social History Social History   Tobacco Use   Smoking status: Every Day    Current packs/day: 0.50    Average packs/day: 0.5 packs/day for 10.0 years (5.0 ttl pk-yrs)    Types: Cigarettes   Smokeless tobacco: Never  Vaping Use   Vaping status: Never Used  Substance Use Topics   Alcohol use: Yes    Alcohol/week: 3.0 standard drinks of alcohol    Types: 1 Glasses of wine, 2 Cans of beer per week   Drug use: Yes    Types: Marijuana     Allergies   Azithromycin, Contrast media [iodinated contrast media], and Shellfish allergy   Review of  Systems Review of Systems  Gastrointestinal:  Positive for vomiting.  Neurological:  Positive for weakness.     Physical Exam Triage Vital Signs ED Triage Vitals  Encounter Vitals Group     BP 10/03/23 1252 106/74     Girls Systolic BP Percentile --      Girls Diastolic BP Percentile --      Boys Systolic BP Percentile --      Boys Diastolic BP Percentile --      Pulse Rate 10/03/23 1252 94     Resp 10/03/23 1252 18     Temp 10/03/23 1252 98.9 F (37.2 C)     Temp Source 10/03/23 1252 Oral     SpO2 10/03/23 1252 96 %     Weight 10/03/23 1250 186 lb 15.2 oz (84.8 kg)     Height 10/03/23 1250 5' 1 (1.549 m)     Head Circumference --      Peak Flow --      Pain Score 10/03/23 1247 0     Pain Loc --      Pain Education --      Exclude from Growth Chart --    Orthostatic VS for the past 24 hrs:  BP- Lying Pulse- Lying BP- Standing at 0 minutes Pulse- Standing at 0 minutes  10/03/23 1306 119/85 90 121/87 100    Updated Vital Signs BP 106/74 (BP Location: Left Arm)   Pulse 94   Temp 98.9 F (37.2 C) (Oral)   Resp 18   Ht 5' 1 (1.549 m)   Wt 84.8 kg   LMP 11/02/2020   SpO2 96%   BMI 35.32 kg/m   Visual Acuity Right Eye Distance:   Left Eye Distance:   Bilateral Distance:    Right Eye  Near:   Left Eye Near:    Bilateral Near:     Physical Exam Vitals reviewed.  Constitutional:      General: She is not in acute distress.    Appearance: She is not toxic-appearing.  HENT:     Nose: Nose normal.     Mouth/Throat:     Mouth: Mucous membranes are moist.     Pharynx: No oropharyngeal exudate or posterior oropharyngeal erythema.   Eyes:     Extraocular Movements: Extraocular movements intact.     Conjunctiva/sclera: Conjunctivae normal.     Pupils: Pupils are equal, round, and reactive to light.    Cardiovascular:     Rate and Rhythm: Normal rate and regular rhythm.     Heart sounds: No murmur heard. Pulmonary:     Effort: Pulmonary effort is normal. No  respiratory distress.     Breath sounds: No stridor. No wheezing, rhonchi or rales.   Musculoskeletal:     Cervical back: Neck supple.  Lymphadenopathy:     Cervical: No cervical adenopathy.   Skin:    Capillary Refill: Capillary refill takes less than 2 seconds.     Coloration: Skin is not jaundiced or pale.   Neurological:     General: No focal deficit present.     Mental Status: She is alert and oriented to person, place, and time.   Psychiatric:        Behavior: Behavior normal.      UC Treatments / Results  Labs (all labs ordered are listed, but only abnormal results are displayed) Labs Reviewed  POCT FASTING CBG [facility] MANUAL ENTRY - Abnormal; Notable for the following components:      Result Value   POCT Glucose ([facility]) 120 (*)    All other components within normal limits  CBC  COMPREHENSIVE METABOLIC PANEL WITH GFR    EKG   Radiology No results found.  Procedures Procedures (including critical care time)  Medications Ordered in UC Medications - No data to display  Initial Impression / Assessment and Plan / UC Course  I have reviewed the triage vital signs and the nursing notes.  Pertinent labs & imaging results that were available during my care of the patient were reviewed by me and considered in my medical decision making (see chart for details).       Fingerstick glucose is 120. Vital signs did not show her to be orthostatic.  CBC and CMP are drawn.  Ondansetron  is sent in for the nausea.  I have asked her not to take her metformin  until she is able to eat more normally.  She is to go to the emergency room if she worsens anyway. Final Clinical Impressions(s) / UC Diagnoses   Final diagnoses:  Nausea and vomiting, unspecified vomiting type  Gastroenteritis     Discharge Instructions      Your sugar was 120.  Ondansetron  dissolved in the mouth every 8 hours as needed for nausea or vomiting. Clear liquids(water, gatorade/pedialyte, ginger  ale/sprite, chicken broth/soup) and bland things(crackers/toast, rice, potato, bananas) to eat. Avoid acidic foods like lemon/lime/orange/tomato, and avoid greasy/spicy foods.   Do not take your metformin  until you can eat more normally.  Please go to the emergency room if you feel worse or cannot keep fluids down.     ED Prescriptions     Medication Sig Dispense Auth. Provider   ondansetron  (ZOFRAN -ODT) 4 MG disintegrating tablet Take 1 tablet (4 mg total) by mouth every 8 (eight) hours as  needed for nausea or vomiting. 10 tablet Vonna Masayoshi Couzens K, MD      PDMP not reviewed this encounter.   Vonna Sharlet POUR, MD 10/03/23 [phone]

## 2023-10-04 ENCOUNTER — Ambulatory Visit (HOSPITAL_COMMUNITY): Payer: Self-pay

## 2023-10-04 LAB — COMPREHENSIVE METABOLIC PANEL WITH GFR
ALT: 17 IU/L (ref 0–32)
AST: 15 IU/L (ref 0–40)
Albumin: 4.4 g/dL (ref 3.8–4.9)
Alkaline Phosphatase: 81 IU/L (ref 44–121)
BUN/Creatinine Ratio: 15 (ref 9–23)
BUN: 16 mg/dL (ref 6–24)
Bilirubin Total: 0.4 mg/dL (ref 0.0–1.2)
CO2: 20 mmol/L (ref 20–29)
Calcium: 9.9 mg/dL (ref 8.7–10.2)
Chloride: 102 mmol/L (ref 96–106)
Creatinine, Ser: 1.05 mg/dL — ABNORMAL HIGH (ref 0.57–1.00)
Globulin, Total: 3.1 g/dL (ref 1.5–4.5)
Glucose: 112 mg/dL — ABNORMAL HIGH (ref 70–99)
Potassium: 4.4 mmol/L (ref 3.5–5.2)
Sodium: 140 mmol/L (ref 134–144)
Total Protein: 7.5 g/dL (ref 6.0–8.5)
eGFR: 62 mL/min/{1.73_m2} (ref >59)

## 2023-10-04 LAB — CBC
Hematocrit: 42.5 % (ref 34.0–46.6)
Hemoglobin: 13.8 g/dL (ref 11.1–15.9)
MCH: 29 pg (ref 26.6–33.0)
MCHC: 32.5 g/dL (ref 31.5–35.7)
MCV: 89 fL (ref 79–97)
Platelets: 262 10*3/uL (ref 150–450)
RBC: 4.76 x10E6/uL (ref 3.77–5.28)
RDW: 14 % (ref 11.7–15.4)
WBC: 6.8 10*3/uL (ref 3.4–10.8)

## 2023-10-13 ENCOUNTER — Other Ambulatory Visit: Payer: Self-pay

## 2023-10-13 ENCOUNTER — Other Ambulatory Visit: Payer: Self-pay | Admitting: Family Medicine

## 2023-10-13 DIAGNOSIS — E876 Hypokalemia: Secondary | ICD-10-CM

## 2023-10-20 ENCOUNTER — Telehealth (HOSPITAL_COMMUNITY): Admitting: Psychiatry

## 2023-11-01 ENCOUNTER — Other Ambulatory Visit: Payer: Self-pay

## 2023-11-09 ENCOUNTER — Ambulatory Visit (HOSPITAL_COMMUNITY): Admitting: Psychiatry

## 2023-11-16 ENCOUNTER — Other Ambulatory Visit: Payer: Self-pay

## 2023-11-24 ENCOUNTER — Other Ambulatory Visit: Payer: Self-pay

## 2023-11-27 ENCOUNTER — Other Ambulatory Visit: Payer: Self-pay

## 2023-11-29 ENCOUNTER — Other Ambulatory Visit (HOSPITAL_COMMUNITY): Payer: Self-pay

## 2023-12-12 ENCOUNTER — Other Ambulatory Visit: Payer: Self-pay

## 2023-12-12 ENCOUNTER — Ambulatory Visit: Attending: Family Medicine | Admitting: Family Medicine

## 2023-12-12 ENCOUNTER — Other Ambulatory Visit (HOSPITAL_COMMUNITY): Payer: Self-pay

## 2023-12-12 ENCOUNTER — Encounter: Payer: Self-pay | Admitting: Family Medicine

## 2023-12-12 VITALS — BP 110/74 | HR 88 | Temp 98.5°F | Resp 14 | Ht 61.0 in | Wt 184.2 lb

## 2023-12-12 DIAGNOSIS — N951 Menopausal and female climacteric states: Secondary | ICD-10-CM

## 2023-12-12 DIAGNOSIS — Z7984 Long term (current) use of oral hypoglycemic drugs: Secondary | ICD-10-CM

## 2023-12-12 DIAGNOSIS — E1165 Type 2 diabetes mellitus with hyperglycemia: Secondary | ICD-10-CM

## 2023-12-12 DIAGNOSIS — J328 Other chronic sinusitis: Secondary | ICD-10-CM | POA: Diagnosis not present

## 2023-12-12 DIAGNOSIS — Z23 Encounter for immunization: Secondary | ICD-10-CM

## 2023-12-12 DIAGNOSIS — R0989 Other specified symptoms and signs involving the circulatory and respiratory systems: Secondary | ICD-10-CM

## 2023-12-12 DIAGNOSIS — E876 Hypokalemia: Secondary | ICD-10-CM | POA: Diagnosis not present

## 2023-12-12 DIAGNOSIS — F319 Bipolar disorder, unspecified: Secondary | ICD-10-CM

## 2023-12-12 DIAGNOSIS — J302 Other seasonal allergic rhinitis: Secondary | ICD-10-CM

## 2023-12-12 DIAGNOSIS — G4709 Other insomnia: Secondary | ICD-10-CM

## 2023-12-12 DIAGNOSIS — M797 Fibromyalgia: Secondary | ICD-10-CM

## 2023-12-12 MED ORDER — ROSUVASTATIN CALCIUM 10 MG PO TABS
10.0000 mg | ORAL_TABLET | Freq: Every day | ORAL | 1 refills | Status: AC
  Filled 2023-12-12 – 2024-03-19 (×5): qty 90, 90d supply, fill #0

## 2023-12-12 MED ORDER — FLUTICASONE PROPIONATE 50 MCG/ACT NA SUSP
1.0000 | Freq: Every day | NASAL | 3 refills | Status: AC
Start: 2023-12-12 — End: ?
  Filled 2023-12-12 (×2): qty 16, 60d supply, fill #0

## 2023-12-12 MED ORDER — PROMETHAZINE-DM 6.25-15 MG/5ML PO SYRP
5.0000 mL | ORAL_SOLUTION | Freq: Four times a day (QID) | ORAL | 0 refills | Status: AC | PRN
  Filled 2023-12-12 (×2): qty 118, 6d supply, fill #0

## 2023-12-12 MED ORDER — NICOTINE 14 MG/24HR TD PT24
14.0000 mg | MEDICATED_PATCH | Freq: Every day | TRANSDERMAL | 0 refills | Status: AC
  Filled 2023-12-12: qty 28, 28d supply, fill #0

## 2023-12-12 MED ORDER — CETIRIZINE HCL 10 MG PO TABS
10.0000 mg | ORAL_TABLET | Freq: Every day | ORAL | 1 refills | Status: AC
  Filled 2023-12-12 (×2): qty 90, 90d supply, fill #0

## 2023-12-12 MED ORDER — NICOTINE 7 MG/24HR TD PT24
7.0000 mg | MEDICATED_PATCH | Freq: Every day | TRANSDERMAL | 0 refills | Status: AC
  Filled 2023-12-12: qty 28, 28d supply, fill #0

## 2023-12-12 MED ORDER — POTASSIUM CHLORIDE CRYS ER 20 MEQ PO TBCR
20.0000 meq | EXTENDED_RELEASE_TABLET | Freq: Every day | ORAL | 1 refills | Status: AC
  Filled 2023-12-12 (×2): qty 90, 90d supply, fill #0

## 2023-12-12 NOTE — Patient Instructions (Signed)
 VISIT SUMMARY:  Today, we discussed your sleep disturbances, congestion, and several other ongoing health issues. We reviewed your current medications and made some adjustments to better manage your symptoms. We also talked about your general health maintenance and scheduled necessary follow-ups.  YOUR PLAN:  -TYPE 2 DIABETES MELLITUS WITH DIABETIC PERIPHERAL NEUROPATHY: Your diabetes is well-controlled with an A1c of 6.0, but you are experiencing numbness in your feet due to diabetic peripheral neuropathy. We will perform a foot exam to assess circulation and neuropathy, and refer you to a podiatrist for toenail care.  -HYPERTENSION: Your blood pressure is well-controlled. Continue with your current management plan.  -BIPOLAR DISORDER: You are continuing psychiatric management but have had difficulty with your current psychiatrist. It is important to contact your psychiatrist to reschedule your appointment or request a different provider if needed.  -FIBROMYALGIA: Fibromyalgia is a condition that causes widespread pain. You are using a TENS machine for relief, and we will refill your pregabalin  prescription to help manage your symptoms.  -CHRONIC BILATERAL SHOULDER PAIN: You have chronic pain in both shoulders, with the left being worse. The pain is not well-controlled, and you have concerns about surgery and anesthesia. We will continue to explore non-surgical options for pain management.  -NICOTINE  DEPENDENCE, CIGARETTES: You are trying to quit smoking and currently smoke one pack every two days. We will prescribe nicotine  patches to help you with smoking cessation.  -INSOMNIA: You have difficulty staying asleep despite using sleep aids. We will continue to monitor this and explore other options to improve your sleep.  -MENOPAUSAL SYMPTOMS WITH HOT FLASHES: You are experiencing significant hot flashes, and clonidine  has not been effective. We will refer you to a gynecologist for further  management of your menopausal symptoms.  -UPPER RESPIRATORY CONGESTION: Your congestion is likely due to sleeping with a fan. You have some wheezing and difficulty expectorating phlegm. We will prescribe cough medication to help with mucus expectoration.  -GENERAL HEALTH MAINTENANCE: You are due for a Pap smear and a mammogram. You have agreed to schedule a mammogram, and we will provide you with the contact information for the breast center. We will also administer flu and pneumonia vaccines and assist you with MyChart access issues.  INSTRUCTIONS:  Please schedule an appointment with a podiatrist for toenail care and contact your psychiatrist to reschedule your missed appointment or request a different provider. Schedule a mammogram using the contact information provided. Follow up with a gynecologist for further management of your menopausal symptoms. Administer the prescribed cough medication as directed. Our nurse will assist you with MyChart access issues. Continue with your current management plan for hypertension and monitor your sleep disturbances. Use the nicotine  patches as prescribed to help quit smoking.

## 2023-12-12 NOTE — Progress Notes (Signed)
 Subjective:  Patient ID: Brandy [name], female    DOB: 12/05/1965  Age: 58 y.o. MRN: [id number]  CC: Follow-up     Discussed the use of AI scribe software for clinical note transcription with the patient, who gave verbal consent to proceed.  History of Present Illness Brandy [name] is a 58 year old female with a history of  hypertension, bipolar disorder, vertigo, GERD, history of pulmonary edema, type 2 diabetes mellitus who presents with sleep disturbances and congestion.  She has difficulty maintaining sleep, waking up after a few hours and staying awake for four to five hours. Tea and psychiatric medications have not improved her sleep.  Med list reveals she is on trazodone  as well as hydroxyzine .  She states she was diagnosed with fibromyalgia after a physical medicine and rehab visit and placed on Lyrica  for this.  Significant congestion occurs, likely due to sleeping with a fan for hot flashes, accompanied by a raspy voice and occasional wheezing. She struggles to expectorate phlegm but denies pain or shortness of breath.  Severe hot flashes persist despite taking clonidine  at bedtime. She has not consulted a gynecologist for further evaluation.  Chronic shoulder pain, worse on the left, is managed with pregabalin , Cymbalta , and a TENS machine. Tramadol and Tylenol  are ineffective.  She is under psychiatric care for bipolar disorder but missed her last appointment. Her A1c is 6.0, and endorses adherence with her diabetes medications.  She experiences foot numbness.  Endorses adherence with her antihypertensive and her statin. She smokes one pack of cigarettes every two days and is attempting to quit.    Past Medical History:  Diagnosis Date   Allergy    Anxiety    Depression    Diverticulitis    Edema    Gallstones    GERD (gastroesophageal reflux disease)    Hyperlipidemia    Hypertension    Osteoarthritis     Past Surgical History:  Procedure  Laterality Date   GALLBLADDER SURGERY      Family History  Problem Relation Age of Onset   Alcohol abuse Mother    Hypertension Father    Diabetes Sister    Schizophrenia Maternal Aunt    Breast cancer Paternal Aunt        56s   Schizophrenia Maternal Grandmother    Coronary artery disease Paternal Grandmother        young age   Diabetes Paternal Grandmother    Colon cancer Neg Hx    Esophageal cancer Neg Hx    Stomach cancer Neg Hx    Rectal cancer Neg Hx     Social History   Socioeconomic History   Marital status: Married    Spouse name: Not on file   Number of children: 3   Years of education: Not on file   Highest education level: Not on file  Occupational History   Occupation: Curator: [employer]  Tobacco Use   Smoking status: Every Day    Current packs/day: 0.50    Average packs/day: 0.5 packs/day for 10.0 years (5.0 ttl pk-yrs)    Types: Cigarettes   Smokeless tobacco: Never  Vaping Use   Vaping status: Never Used  Substance and Sexual Activity   Alcohol use: Yes    Alcohol/week: 3.0 standard drinks of alcohol    Types: 1 Glasses of wine, 2 Cans of beer per week   Drug use: Yes    Types: Marijuana   Sexual  activity: Yes    Partners: Male    Birth control/protection: None  Other Topics Concern   Not on file  Social History Narrative   Not on file   Social Drivers of Health   Financial Resource Strain: Low Risk  (09/12/2023)   Overall Financial Resource Strain (CARDIA)    Difficulty of Paying Living Expenses: Not hard at all  Food Insecurity: No Food Insecurity (09/12/2023)   Hunger Vital Sign    Worried About Running Out of Food in the Last Year: Never true    Ran Out of Food in the Last Year: Never true  Transportation Needs: No Transportation Needs (09/12/2023)   PRAPARE - Administrator, Civil Service (Medical): No    Lack of Transportation (Non-Medical): No  Physical Activity: Inactive (09/12/2023)   Exercise Vital  Sign    Days of Exercise per Week: 0 days    Minutes of Exercise per Session: 0 min  Stress: No Stress Concern Present (09/12/2023)   [employer] of Occupational Health - Occupational Stress Questionnaire    Feeling of Stress : Not at all  Social Connections: Moderately Isolated (09/12/2023)   Social Connection and Isolation Panel    Frequency of Communication with Friends and Family: More than three times a week    Frequency of Social Gatherings with Friends and Family: Three times a week    Attends Religious Services: Never    Active Member of Clubs or Organizations: No    Attends Banker Meetings: Never    Marital Status: Married    Allergies  Allergen Reactions   Azithromycin Other (See Comments)    Stomach cramps   Contrast Media [Iodinated Contrast Media] Hives   Shellfish Allergy Swelling and Other (See Comments)    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 30 tablet 0   albuterol  (VENTOLIN  HFA) 108 (90 Base) MCG/ACT inhaler Inhale 2 puffs into the lungs every 4 (four) hours as needed for wheezing or shortness of breath. 18 g 0   amLODipine  (NORVASC ) 5 MG tablet Take 1 tablet (5 mg total) by mouth daily. 90 tablet 1   aspirin  EC 81 MG EC tablet Take 1 tablet (81 mg total) by mouth daily.     Blood Glucose Monitoring Suppl (ONE TOUCH ULTRA 2) w/Device KIT Use to check blood sugar 3 (three) times daily. 1 kit 0   carvedilol  (COREG ) 25 MG tablet Take 1 tablet (25 mg total) by mouth 2 (two) times daily with a meal. 60 tablet 3   cetirizine  (ZYRTEC ) 10 MG tablet Take 1 tablet (10 mg total) by mouth daily. 90 tablet 1   cloNIDine  (CATAPRES ) 0.1 MG tablet Take 1 tablet (0.1 mg total) by mouth at bedtime as needed for hot flashes. 90 tablet 1   Continuous Glucose Sensor (FREESTYLE LIBRE 3  SENSOR) MISC Place 1 sensor on the skin every 14 days. Use to check glucose continuously 2 each 11   DULoxetine  (CYMBALTA ) 60 MG capsule Take 1 capsule (60 mg total) by mouth daily. 90 capsule 0   Fezolinetant  (VEOZAH ) 45 MG TABS Take 1 tablet (45 mg total) by mouth daily. For hot flashes 90 tablet 1   fluticasone  (FLONASE ) 50 MCG/ACT nasal spray Place 1 spray into both nostrils daily. 16 g 3   furosemide  (LASIX )  20 MG tablet Take 1 tablet (20 mg total) by mouth daily. 90 tablet 0   glucose blood (ACCU-CHEK GUIDE) test strip Use as instructed three times daily 100 each 12   haloperidol  (HALDOL ) 5 MG tablet Take 1 tablet (5 mg total) by mouth 2 (two) times daily. 60 tablet 2   hydrOXYzine  (VISTARIL ) 25 MG capsule Take 1 capsule (25 mg total) by mouth daily as needed for anxiety. 30 capsule 2   lamoTRIgine  (LAMICTAL ) 150 MG tablet Take 1 tablet (150 mg total) by mouth 2 (two) times daily. 180 tablet 0   Lancets (ONETOUCH DELICA PLUS LANCET33G) MISC use as directed 3 times daily before meals 100 each 3   losartan -hydrochlorothiazide  (HYZAAR ) 100-25 MG tablet Take 1 tablet by mouth daily. 90 tablet 1   meloxicam  (MOBIC ) 7.5 MG tablet Take 1 tablet (7.5 mg total) by mouth daily. 30 tablet 1   metFORMIN  (GLUCOPHAGE ) 500 MG tablet Take 1 tablet (500 mg total) by mouth 2 (two) times daily with a meal. 180 tablet 1   methocarbamol  (ROBAXIN ) 750 MG tablet Take 1 tablet (750 mg total) by mouth 2 (two) times daily as needed for muscle spasms. 60 tablet 0   nitroGLYCERIN  (NITRODUR - DOSED IN MG/24 HR) 0.2 mg/hr patch Place 1 patch (0.2 mg total) onto the skin daily. Apply patch near the affected area and change location daily. 30 patch 1   ondansetron  (ZOFRAN -ODT) 4 MG disintegrating tablet Dissolve 1 tablet (4 mg total) in mouth every 8 (eight) hours as needed for nausea or vomiting. 10 tablet 0   pantoprazole  (PROTONIX ) 40 MG tablet Take 1 tablet (40 mg total) by mouth daily. 90 tablet 1   potassium chloride   SA (KLOR-CON  M) 20 MEQ tablet Take 1 tablet (20 mEq total) by mouth daily. **need office visit for refills** 30 tablet 0   pregabalin  (LYRICA ) 75 MG capsule Take 1 capsule (75 mg total) by mouth 2 (two) times daily. 60 capsule 3   pregabalin  (LYRICA ) 75 MG capsule Take 1 capsule by mouth 2 times daily 60 capsule 3   rosuvastatin  (CRESTOR ) 10 MG tablet Take 1 tablet (10 mg total) by mouth daily. 90 tablet 1   Semaglutide ,0.25 or 0.5MG /DOS, (OZEMPIC , 0.25 OR 0.5 MG/DOSE,) 2 MG/3ML SOPN Inject 0.5 mg into the skin once a week. 3 mL 3   traZODone  (DESYREL ) 100 MG tablet Take 1 tablet (100 mg total) by mouth at bedtime. 30 tablet 2   guaiFENesin  (MUCINEX ) 600 MG 12 hr tablet Take 1 tablet (600 mg total) by mouth 2 (two) times daily. (Patient not taking: Reported on 12/12/2023) 20 tablet 0   No facility-administered medications prior to visit.     ROS Review of Systems  Constitutional:  Negative for activity change and appetite change.  HENT:  Negative for sinus pressure and sore throat.   Respiratory:  Positive for cough. Negative for chest tightness, shortness of breath and wheezing.   Cardiovascular:  Negative for chest pain and palpitations.  Gastrointestinal:  Negative for abdominal distention, abdominal pain and constipation.  Genitourinary: Negative.   Musculoskeletal:        Bilateral shoulder pain  Psychiatric/Behavioral:  Positive for dysphoric mood. Negative for behavioral problems.     Objective:  BP 110/74 (BP Location: Right Arm, Patient Position: Sitting, Cuff Size: Normal)   Pulse 88   Temp 98.5 F (36.9 C) (Oral)   Resp 14   Ht 5' 1 (1.549 m)   Wt 184 lb 3.2 oz (83.6 kg)  LMP 11/02/2020   SpO2 99%   BMI 34.80 kg/m      12/12/2023    1:43 PM 10/03/2023   12:52 PM 10/03/2023   12:50 PM  BP/Weight  Systolic BP 110 106   Diastolic BP 74 74   Wt. (Lbs) 184.2  186.95  BMI 34.8 kg/m2  35.32 kg/m2      Physical Exam Constitutional:      Appearance: She is  well-developed.  Cardiovascular:     Rate and Rhythm: Normal rate.     Heart sounds: Normal heart sounds. No murmur heard. Pulmonary:     Effort: Pulmonary effort is normal.     Breath sounds: Normal breath sounds. No wheezing or rales.  Chest:     Chest wall: No tenderness.  Abdominal:     General: Bowel sounds are normal. There is no distension.     Palpations: Abdomen is soft. There is no mass.     Tenderness: There is no abdominal tenderness.  Musculoskeletal:     Right lower leg: No edema.     Left lower leg: No edema.     Comments: Decreased range of motion in bilateral upper extremity with associated tenderness  Neurological:     Mental Status: She is alert and oriented to person, place, and time.  Psychiatric:     Comments: Dysphoric mood    Diabetic Foot Exam - Simple   Simple Foot Form Diabetic Foot exam was performed with the following findings: Yes 12/12/2023  2:13 PM  Visual Inspection No deformities, no ulcerations, no other skin breakdown bilaterally: Yes Sensation Testing Intact to touch and monofilament testing bilaterally: Yes Pulse Check Posterior Tibialis and Dorsalis pulse intact bilaterally: Yes Comments        Latest Ref Rng & Units 10/03/2023    1:13 PM 09/08/2023    4:57 PM 01/11/2023    4:51 PM  CMP  Glucose 70 - 99 mg/dL 887  858  885   BUN 6 - 24 mg/dL 16  11  17    Creatinine 0.57 - 1.00 mg/dL 8.94  9.17  8.84   Sodium 134 - 144 mmol/L 140  139  140   Potassium 3.5 - 5.2 mmol/L 4.4  4.1  3.5   Chloride 96 - 106 mmol/L 102  99  99   CO2 20 - 29 mmol/L 20  25  26    Calcium  8.7 - 10.2 mg/dL 9.9  89.9  89.9   Total Protein 6.0 - 8.5 g/dL 7.5  7.2    Total Bilirubin 0.0 - 1.2 mg/dL 0.4  <9.7    Alkaline Phos 44 - 121 IU/L 81  80    AST 0 - 40 IU/L 15  17    ALT 0 - 32 IU/L 17  15      Lipid Panel     Component Value Date/Time   CHOL 110 09/08/2023 1657   TRIG 132 09/08/2023 1657   HDL 53 09/08/2023 1657   CHOLHDL 2.1 09/08/2023 1657    CHOLHDL 3.1 02/03/2015 0420   VLDL 24 02/03/2015 0420   LDLCALC 34 09/08/2023 1657    CBC    Component Value Date/Time   WBC 6.8 10/03/2023 1313   WBC 5.7 10/08/2015 0815   RBC 4.76 10/03/2023 1313   RBC 4.27 10/08/2015 0815   HGB 13.8 10/03/2023 1313   HCT 42.5 10/03/2023 1313   PLT 262 10/03/2023 1313   MCV 89 10/03/2023 1313   MCH 29.0 10/03/2023 1313  MCH 28.6 10/08/2015 0815   MCHC 32.5 10/03/2023 1313   MCHC 32.4 10/08/2015 0815   RDW 14.0 10/03/2023 1313   LYMPHSABS 1.8 10/08/2015 0815   MONOABS 0.2 10/08/2015 0815   EOSABS 0.2 10/08/2015 0815   BASOSABS 0.0 10/08/2015 0815    Lab Results  Component Value Date   HGBA1C 6.0 09/08/2023       Assessment & Plan Type 2 diabetes mellitus with diabetic peripheral neuropathy Diabetes well-controlled with A1c of 6.0. Numbness in feet consistent with diabetic peripheral neuropathy.  -Continue Lyrica  - Perform foot exam to assess circulation and neuropathy. - Refer to podiatrist for toenail care.  Hypertension Blood pressure well-controlled.  Bipolar disorder Continues psychiatric management. Reports difficulty with current psychiatrist and missed appointment. - Encourage to contact psychiatrist to reschedule appointment or request a different provider.  Fibromyalgia Uses TENS machine for additional relief. - Refill pregabalin  prescription.    Nicotine  dependence, cigarettes Desires to quit smoking. Smokes one pack every two days. Reports cigarettes taste unpleasant. - Prescribe nicotine  patches to aid in smoking cessation.  Insomnia Difficulty staying asleep despite sleep aids. Psychiatric medications not effective. - Will defer to psychiatry given she is on sedative medications  Menopausal symptoms with hot flashes Significant hot flashes. Clonidine  ineffective. - Refer to gynecologist for further management of menopausal symptoms.  Chest congestion with associated chronic sinusitis  No pain or  significant shortness of breath. Some wheezing and difficulty expectorating phlegm. Raspy voice. - Prescribe cough medication to help with mucus expectoration.   Hypokalemia Diuretic induced Continue potassium   General Health Maintenance Due for Pap smear and mammogram. Reluctant for Pap smear but agrees to mammogram. Needs MyChart access assistance. - Provide contact information for breast center to schedule mammogram. - Administer flu and pneumonia vaccines. - Have nurse assist with MyChart access issues.     Healthcare maintenance Encounter for vaccine administration-flu shot and pneumonia vaccines administered  No orders of the defined types were placed in this encounter.   Follow-up: No follow-ups on file.       Corrina Sabin, MD, FAAFP. [facility] [place], [place] [phone]   12/12/2023, 1:46 PM

## 2023-12-20 ENCOUNTER — Encounter (HOSPITAL_COMMUNITY): Payer: Self-pay | Admitting: Psychiatry

## 2023-12-20 ENCOUNTER — Other Ambulatory Visit: Payer: Self-pay

## 2023-12-20 ENCOUNTER — Other Ambulatory Visit (HOSPITAL_COMMUNITY): Payer: Self-pay

## 2023-12-20 ENCOUNTER — Telehealth (HOSPITAL_BASED_OUTPATIENT_CLINIC_OR_DEPARTMENT_OTHER): Admitting: Psychiatry

## 2023-12-20 VITALS — Wt 184.0 lb

## 2023-12-20 DIAGNOSIS — F419 Anxiety disorder, unspecified: Secondary | ICD-10-CM | POA: Diagnosis not present

## 2023-12-20 DIAGNOSIS — F5101 Primary insomnia: Secondary | ICD-10-CM | POA: Diagnosis not present

## 2023-12-20 DIAGNOSIS — F319 Bipolar disorder, unspecified: Secondary | ICD-10-CM | POA: Diagnosis not present

## 2023-12-20 MED ORDER — LAMOTRIGINE 150 MG PO TABS
150.0000 mg | ORAL_TABLET | Freq: Two times a day (BID) | ORAL | 0 refills | Status: DC
  Filled 2023-12-20 – 2024-02-01 (×4): qty 180, 90d supply, fill #0

## 2023-12-20 MED ORDER — TRAZODONE HCL 100 MG PO TABS
100.0000 mg | ORAL_TABLET | Freq: Every day | ORAL | 2 refills | Status: DC
  Filled 2023-12-20 (×2): qty 30, 30d supply, fill #0

## 2023-12-20 MED ORDER — HYDROXYZINE PAMOATE 50 MG PO CAPS
50.0000 mg | ORAL_CAPSULE | Freq: Every day | ORAL | 2 refills | Status: DC | PRN
  Filled 2023-12-20 – 2024-02-01 (×4): qty 30, 30d supply, fill #0

## 2023-12-20 MED ORDER — DULOXETINE HCL 60 MG PO CPEP
60.0000 mg | ORAL_CAPSULE | Freq: Every day | ORAL | 0 refills | Status: DC
  Filled 2023-12-20 – 2024-02-01 (×4): qty 90, 90d supply, fill #0

## 2023-12-20 MED ORDER — HALOPERIDOL 5 MG PO TABS
5.0000 mg | ORAL_TABLET | Freq: Two times a day (BID) | ORAL | 2 refills | Status: DC
  Filled 2023-12-20 – 2024-02-01 (×4): qty 60, 30d supply, fill #0

## 2023-12-20 NOTE — Progress Notes (Signed)
 [facility] MD Virtual Progress Note   Patient Location: Home Provider Location: Home Office  I connect with patient by video and verified that I am speaking with correct person by using two identifiers. I discussed the limitations of evaluation and management by telemedicine and the availability of in person appointments. I also discussed with the patient that there may be a patient responsible charge related to this service. The patient expressed understanding and agreed to proceed.  Brandy [name] [id number] 58 y.o.  12/20/2023 11:27 AM  History of Present Illness:  Patient is evaluated by video session.  She missed the last appointment because she reported having issues with the phone and could not do MyChart visit.  She is taking medication but still reporting chronic symptoms.  She reported chronic pain, numbness and now having hot flashes.  She is not sleeping very well.  She reported her anxiety and depression is not as bad but is still having crying spells when she think about her kids and husband.  Her husband is having shoulder surgery in November.  Patient also will have shoulder surgery soon.  She denies any suicidal thoughts, homicidal thoughts, paranoia.  She reported getting easily overwhelmed and sometimes having nightmares and flashback.  We have recommended to see therapist but patient did not call back to schedule appointment.  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  She continues to work 2 to 3 hours a day.  She liked the hydroxyzine  but reported not sleeping all night.  Her shoulder pain bothers her a lot.  She is hoping to have a physical therapy very soon.  Recently had a visit with primary care.  She is on Ozempic .  Her creatinine improved and her blood sugar improved from the past.  Patient lives with her husband.  She denies any drinking or using any illegal substance.  Past Psychiatric History: H/O inpatient in  01/04/1996 after mother died.  Seen at [facility] from 1990-10-02October 02, 2000,   She saw Dr. Arlen and did IOP in 01/04/16.  No h/o suicidal attempt.  H/O paranoia, depression and paranoia. Had tried Zoloft, Wellbutrin, Rexulti , Klonopin , Abilify , Lexapro , Risperdal , temazepam , Lunesta  and Seroquel .   Past Medical History:  Diagnosis Date   Allergy    Anxiety    Depression    Diverticulitis    Edema    Gallstones    GERD (gastroesophageal reflux disease)    Hyperlipidemia    Hypertension    Osteoarthritis     Outpatient Encounter Medications as of 12/20/2023  Medication Sig   acetaminophen -codeine  (TYLENOL  #3) 300-30 MG tablet Take 1-2 tablets by mouth every 4 (four) hours as needed for moderate pain.   albuterol  (VENTOLIN  HFA) 108 (90 Base) MCG/ACT inhaler Inhale 2 puffs into the lungs every 4 (four) hours as needed for wheezing or shortness of breath.   amLODipine  (NORVASC ) 5 MG tablet Take 1 tablet (5 mg total) by mouth daily.   aspirin  EC 81 MG EC tablet Take 1 tablet (81 mg total) by mouth daily.   Blood Glucose Monitoring Suppl (ONE TOUCH ULTRA 2) w/Device KIT Use to check blood sugar 3 (three) times daily.   carvedilol  (COREG ) 25 MG tablet Take 1 tablet (25 mg total) by mouth 2 (two) times daily with a meal.   cetirizine  (ZYRTEC ) 10 MG tablet Take 1 tablet (10 mg total) by mouth daily.   cloNIDine  (CATAPRES ) 0.1 MG tablet Take 1 tablet (0.1 mg total) by mouth  at bedtime as needed for hot flashes.   Continuous Glucose Sensor (FREESTYLE LIBRE 3 SENSOR) MISC Place 1 sensor on the skin every 14 days. Use to check glucose continuously   DULoxetine  (CYMBALTA ) 60 MG capsule Take 1 capsule (60 mg total) by mouth daily.   Fezolinetant  (VEOZAH ) 45 MG TABS Take 1 tablet (45 mg total) by mouth daily. For hot flashes   fluticasone  (FLONASE ) 50 MCG/ACT nasal spray Place 1 spray into both nostrils daily.   furosemide  (LASIX ) 20 MG tablet Take 1 tablet (20 mg total) by mouth daily.   glucose blood (ACCU-CHEK GUIDE)  test strip Use as instructed three times daily   guaiFENesin  (MUCINEX ) 600 MG 12 hr tablet Take 1 tablet (600 mg total) by mouth 2 (two) times daily. (Patient not taking: Reported on 12/12/2023)   haloperidol  (HALDOL ) 5 MG tablet Take 1 tablet (5 mg total) by mouth 2 (two) times daily.   hydrOXYzine  (VISTARIL ) 25 MG capsule Take 1 capsule (25 mg total) by mouth daily as needed for anxiety.   lamoTRIgine  (LAMICTAL ) 150 MG tablet Take 1 tablet (150 mg total) by mouth 2 (two) times daily.   Lancets (ONETOUCH DELICA PLUS LANCET33G) MISC use as directed 3 times daily before meals   losartan -hydrochlorothiazide  (HYZAAR ) 100-25 MG tablet Take 1 tablet by mouth daily.   meloxicam  (MOBIC ) 7.5 MG tablet Take 1 tablet (7.5 mg total) by mouth daily.   metFORMIN  (GLUCOPHAGE ) 500 MG tablet Take 1 tablet (500 mg total) by mouth 2 (two) times daily with a meal.   methocarbamol  (ROBAXIN ) 750 MG tablet Take 1 tablet (750 mg total) by mouth 2 (two) times daily as needed for muscle spasms.   nicotine  (NICODERM CQ ) 14 mg/24hr patch Place 1 patch (14 mg total) onto the skin daily for 4 weeks then decrease to 7 mg patches.   nicotine  (NICODERM CQ ) 7 mg/24hr patch Place 1 patch (7 mg total) onto the skin daily.   nitroGLYCERIN  (NITRODUR - DOSED IN MG/24 HR) 0.2 mg/hr patch Place 1 patch (0.2 mg total) onto the skin daily. Apply patch near the affected area and change location daily.   ondansetron  (ZOFRAN -ODT) 4 MG disintegrating tablet Dissolve 1 tablet (4 mg total) in mouth every 8 (eight) hours as needed for nausea or vomiting.   pantoprazole  (PROTONIX ) 40 MG tablet Take 1 tablet (40 mg total) by mouth daily.   potassium chloride  SA (KLOR-CON  M) 20 MEQ tablet Take 1 tablet (20 mEq total) by mouth daily.   pregabalin  (LYRICA ) 75 MG capsule Take 1 capsule (75 mg total) by mouth 2 (two) times daily.   pregabalin  (LYRICA ) 75 MG capsule Take 1 capsule by mouth 2 times daily   promethazine -dextromethorphan (PROMETHAZINE -DM)  6.25-15 MG/5ML syrup Take 5 mLs by mouth 4 (four) times daily as needed for cough.   rosuvastatin  (CRESTOR ) 10 MG tablet Take 1 tablet (10 mg total) by mouth daily.   Semaglutide ,0.25 or 0.5MG /DOS, (OZEMPIC , 0.25 OR 0.5 MG/DOSE,) 2 MG/3ML SOPN Inject 0.5 mg into the skin once a week.   traZODone  (DESYREL ) 100 MG tablet Take 1 tablet (100 mg total) by mouth at bedtime.   [DISCONTINUED] losartan  (COZAAR ) 100 MG tablet Take 1 tablet (100 mg total) by mouth daily.   No facility-administered encounter medications on file as of 12/20/2023.    Recent Results (from the past 2160 hours)  POCT CBG (manual entry)     Status: Abnormal   Collection Time: 10/03/23  1:03 PM  Result Value Ref Range  POCT Glucose (KUC) 120 (A) 70 - 99 mg/dL  CBC     Status: None   Collection Time: 10/03/23  1:13 PM  Result Value Ref Range   WBC 6.8 3.4 - 10.8 x10E3/uL   RBC 4.76 3.77 - 5.28 x10E6/uL   Hemoglobin 13.8 11.1 - 15.9 g/dL   Hematocrit 57.4 65.9 - 46.6 %   MCV 89 79 - 97 fL   MCH 29.0 26.6 - 33.0 pg   MCHC 32.5 31.5 - 35.7 g/dL   RDW 85.9 88.2 - 84.5 %   Platelets 262 150 - 450 x10E3/uL  Comprehensive metabolic panel     Status: Abnormal   Collection Time: 10/03/23  1:13 PM  Result Value Ref Range   Glucose 112 (H) 70 - 99 mg/dL   BUN 16 6 - 24 mg/dL   Creatinine, Ser 8.94 (H) 0.57 - 1.00 mg/dL   eGFR 62 >40 fO/fpw/8.26   BUN/Creatinine Ratio 15 9 - 23   Sodium 140 134 - 144 mmol/L   Potassium 4.4 3.5 - 5.2 mmol/L   Chloride 102 96 - 106 mmol/L   CO2 20 20 - 29 mmol/L   Calcium  9.9 8.7 - 10.2 mg/dL   Total Protein 7.5 6.0 - 8.5 g/dL   Albumin 4.4 3.8 - 4.9 g/dL   Globulin, Total 3.1 1.5 - 4.5 g/dL   Bilirubin Total 0.4 0.0 - 1.2 mg/dL   Alkaline Phosphatase 81 44 - 121 IU/L   AST 15 0 - 40 IU/L   ALT 17 0 - 32 IU/L     Psychiatric Specialty Exam: Physical Exam  Review of Systems  Constitutional:        Hot flashes  Musculoskeletal:  Positive for back pain.       Left shoulder pain   Neurological:  Positive for numbness.    Weight 184 lb (83.5 kg), last menstrual period 11/02/2020.There is no height or weight on file to calculate BMI.  General Appearance: Casual  Eye Contact:  Fair  Speech:  Slow and hoarseness  Volume:  Decreased  Mood:  Anxious  Affect:  Depressed  Thought Process:  Descriptions of Associations: Intact  Orientation:  Full (Time, Place, and Person)  Thought Content:  Rumination  Suicidal Thoughts:  No  Homicidal Thoughts:  No  Memory:  Immediate;   Fair Recent;   Fair Remote;   Fair  Judgement:  Fair  Insight:  Fair  Psychomotor Activity:  Decreased  Concentration:  Concentration: Fair and Attention Span: Fair  Recall:  [employer] of Knowledge:  Fair  Language:  Fair  Akathisia:  No  Handed:  Right  AIMS (if indicated):     Assets:  Communication Skills Desire for Improvement Housing  ADL's:  Intact  Cognition:  WNL  Sleep:  2-3 hrs       12/12/2023    2:25 PM 09/12/2023   10:38 AM 07/21/2023    9:07 AM 04/27/2023    2:57 PM 08/24/2022    8:29 PM  Depression screen PHQ 2/9  Decreased Interest 3 3 2 1 3   Down, Depressed, Hopeless 2 3 1 3 3   PHQ - 2 Score 5 6 3 4 6   Altered sleeping 3 3 1 1 3   Tired, decreased energy 3 3 2 3 3   Change in appetite 2 1 1 3 3   Feeling bad or failure about yourself  2 1 0 3 3  Trouble concentrating 3 1 1 3 3   Moving slowly or fidgety/restless  3 0 1 0 3  Suicidal thoughts 2 0 1 1 1   PHQ-9 Score 23 15 10 18 25   Difficult doing work/chores  Not difficult at all Somewhat difficult Extremely dIfficult     Assessment/Plan: Bipolar I disorder (HCC) - Plan: lamoTRIgine  (LAMICTAL ) 150 MG tablet, haloperidol  (HALDOL ) 5 MG tablet  Anxiety - Plan: DULoxetine  (CYMBALTA ) 60 MG capsule, lamoTRIgine  (LAMICTAL ) 150 MG tablet, hydrOXYzine  (VISTARIL ) 50 MG capsule, haloperidol  (HALDOL ) 5 MG tablet  Primary insomnia - Plan: hydrOXYzine  (VISTARIL ) 50 MG capsule, traZODone  (DESYREL ) 100 MG tablet, haloperidol   (HALDOL ) 5 MG tablet  Patient is 58 year old African-American employed, married female with multiple health issues noticeable shoulder pain, numbness, hypertension, GERD, diabetes, bipolar disorder, insomnia, anxiety.  Currently taking medication but is still have residual symptoms.  Discussed polypharmacy, reviewed current medication and blood work results.  She is going to start physical therapy very soon to help her pain.  Emphasis given about seeing therapist to help her coping skills.  She is not sure if she can afford but like to get a referral.  I recommend to try higher dose of hydroxyzine  to help her sleep and anxiety but her insomnia could be due to pain in her shoulder.  She is hoping to have a surgery very soon.  Will continue Lamictal  150 mg 2 times a day, Haldol  5 mg twice a day, trazodone  100 mg at bedtime, Cymbalta  60 mg daily and we will increase hydroxyzine  50 mg at bedtime.  Will refer for therapy.  Recommend to call back and emphasis given about compliance with follow-up appointments.  Will schedule in 3 months unless patient requested an earlier appointment.  Discussed medication side effects and benefits.   Follow Up Instructions:     I discussed the assessment and treatment plan with the patient. The patient was provided an opportunity to ask questions and all were answered. The patient agreed with the plan and demonstrated an understanding of the instructions.   The patient was advised to call back or seek an in-person evaluation if the symptoms worsen or if the condition fails to improve as anticipated.    Collaboration of Care: Other provider involved in patient's care AEB notes are available in epic to review  Patient/Guardian was advised Release of Information must be obtained prior to any record release in order to collaborate their care with an outside provider. Patient/Guardian was advised if they have not already done so to contact the registration department to sign  all necessary forms in order for us  to release information regarding their care.   Consent: Patient/Guardian gives verbal consent for treatment and assignment of benefits for services provided during this visit. Patient/Guardian expressed understanding and agreed to proceed.     Total encounter time 26 minutes which includes face-to-face time, chart reviewed, care coordination, order entry and documentation during this encounter.   Note: This document was prepared by [employer] voice dictation technology and any errors that results from this process are unintentional.    Leni ONEIDA Client, MD 12/20/2023

## 2023-12-21 ENCOUNTER — Other Ambulatory Visit (HOSPITAL_COMMUNITY): Payer: Self-pay

## 2023-12-25 ENCOUNTER — Other Ambulatory Visit: Payer: Self-pay

## 2023-12-27 ENCOUNTER — Other Ambulatory Visit: Payer: Self-pay

## 2024-01-01 ENCOUNTER — Encounter: Payer: Self-pay | Admitting: Podiatry

## 2024-01-01 ENCOUNTER — Ambulatory Visit: Admitting: Podiatry

## 2024-01-01 VITALS — Ht 61.0 in | Wt 184.0 lb

## 2024-01-01 DIAGNOSIS — M79674 Pain in right toe(s): Secondary | ICD-10-CM

## 2024-01-01 DIAGNOSIS — M79675 Pain in left toe(s): Secondary | ICD-10-CM

## 2024-01-01 DIAGNOSIS — B351 Tinea unguium: Secondary | ICD-10-CM

## 2024-01-03 ENCOUNTER — Other Ambulatory Visit: Payer: Self-pay

## 2024-01-05 NOTE — Progress Notes (Signed)
   Chief Complaint  Patient presents with   Nail Problem    Pt is here for [facility].    SUBJECTIVE Patient with a history of diabetes mellitus presents to office today complaining of elongated, thickened nails that cause pain while ambulating in shoes.  Patient is unable to trim their own nails. Patient is here for further evaluation and treatment.  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 Derm Skin is dry and supple bilateral. Negative open lesions or macerations. Remaining integument unremarkable. Nails are tender, long, thickened and dystrophic with subungual debris, consistent with onychomycosis, 1-5 bilateral. No signs of infection noted. Vasc  DP and PT pedal pulses palpable bilaterally. Temperature gradient within normal limits.  Neuro Epicritic and protective threshold sensation diminished bilaterally.  Musculoskeletal Exam No symptomatic pedal deformities noted bilateral. Muscular strength within normal limits.  ASSESSMENT 1. Diabetes Mellitus w/ peripheral neuropathy 2.  Pain due to onychomycosis of toenails bilateral  PLAN OF CARE 1. Patient evaluated today. 2. Instructed to maintain good pedal hygiene and foot care. Stressed importance of controlling blood sugar.  3. Mechanical debridement of nails 1-5 bilaterally performed using a nail nipper. Filed with dremel without incident.   4. Return to clinic in 3 mos.     Thresa EMERSON Sar, DPM [facility]  Dr. Thresa EMERSON Sar, DPM    [address] [place], [place] [address]                Office [phone]  Fax [phone]

## 2024-02-01 ENCOUNTER — Other Ambulatory Visit: Payer: Self-pay

## 2024-02-01 ENCOUNTER — Other Ambulatory Visit (HOSPITAL_COMMUNITY): Payer: Self-pay

## 2024-02-01 ENCOUNTER — Other Ambulatory Visit: Payer: Self-pay | Admitting: Physician Assistant

## 2024-02-01 DIAGNOSIS — E1165 Type 2 diabetes mellitus with hyperglycemia: Secondary | ICD-10-CM

## 2024-02-01 DIAGNOSIS — E1159 Type 2 diabetes mellitus with other circulatory complications: Secondary | ICD-10-CM

## 2024-02-01 MED ORDER — CARVEDILOL 25 MG PO TABS
25.0000 mg | ORAL_TABLET | Freq: Two times a day (BID) | ORAL | 3 refills | Status: DC
  Filled 2024-02-01 (×2): qty 60, 30d supply, fill #0
  Filled 2024-03-05 – 2024-03-11 (×2): qty 60, 30d supply, fill #1
  Filled 2024-04-09: qty 60, 30d supply, fill #2

## 2024-02-01 MED ORDER — OZEMPIC (0.25 OR 0.5 MG/DOSE) 2 MG/3ML ~~LOC~~ SOPN
0.5000 mg | PEN_INJECTOR | SUBCUTANEOUS | 3 refills | Status: DC
  Filled 2024-02-01 (×2): qty 3, 28d supply, fill #0
  Filled 2024-02-02: qty 9, 84d supply, fill #0

## 2024-02-02 ENCOUNTER — Other Ambulatory Visit: Payer: Self-pay

## 2024-02-05 ENCOUNTER — Encounter: Payer: Self-pay | Admitting: Radiology

## 2024-02-19 ENCOUNTER — Other Ambulatory Visit: Payer: Self-pay

## 2024-03-05 ENCOUNTER — Other Ambulatory Visit: Payer: Self-pay | Admitting: Pharmacist

## 2024-03-05 ENCOUNTER — Other Ambulatory Visit (HOSPITAL_COMMUNITY): Payer: Self-pay

## 2024-03-05 ENCOUNTER — Other Ambulatory Visit: Payer: Self-pay

## 2024-03-05 NOTE — Progress Notes (Signed)
 Pharmacy Quality Measure Review  This patient is appearing on a report for the adherence measure for cholesterol (statin), diabetes, and hypertension (ACEi/ARB) medications this calendar year.   Medication: losartan -HCTZ Last fill date: 02/02/24 for 90 day supply  Insurance report was not up to date. No action needed at this time.   Medication: metformin  Last fill date: 11/30/2023 for 90 day supply  Contacted pharmacy to facilitate refills.  Medication: rosuvastatin   Last fill date: 11/16/2023 for 90 day supply  Contacted pharmacy to facilitate refills.  Herlene Fleeta Morris, PharmD, JAQUELINE, CPP Clinical Pharmacist [facility] [phone]

## 2024-03-06 ENCOUNTER — Encounter: Payer: Self-pay | Admitting: Pharmacist

## 2024-03-06 ENCOUNTER — Other Ambulatory Visit: Payer: Self-pay

## 2024-03-08 ENCOUNTER — Other Ambulatory Visit: Payer: Self-pay

## 2024-03-11 ENCOUNTER — Other Ambulatory Visit: Payer: Self-pay

## 2024-03-11 ENCOUNTER — Other Ambulatory Visit (HOSPITAL_COMMUNITY): Payer: Self-pay

## 2024-03-14 ENCOUNTER — Other Ambulatory Visit: Payer: Self-pay | Admitting: Family Medicine

## 2024-03-14 ENCOUNTER — Other Ambulatory Visit: Payer: Self-pay

## 2024-03-14 DIAGNOSIS — K219 Gastro-esophageal reflux disease without esophagitis: Secondary | ICD-10-CM

## 2024-03-19 ENCOUNTER — Other Ambulatory Visit: Payer: Self-pay

## 2024-03-19 ENCOUNTER — Other Ambulatory Visit: Payer: Self-pay | Admitting: Family Medicine

## 2024-03-19 ENCOUNTER — Other Ambulatory Visit (HOSPITAL_COMMUNITY): Payer: Self-pay

## 2024-03-19 DIAGNOSIS — K219 Gastro-esophageal reflux disease without esophagitis: Secondary | ICD-10-CM

## 2024-03-19 MED ORDER — PANTOPRAZOLE SODIUM 40 MG PO TBEC
40.0000 mg | DELAYED_RELEASE_TABLET | Freq: Every day | ORAL | 0 refills | Status: DC
  Filled 2024-03-19 (×2): qty 90, 90d supply, fill #0

## 2024-03-20 ENCOUNTER — Encounter (HOSPITAL_COMMUNITY): Payer: Self-pay | Admitting: Psychiatry

## 2024-03-20 ENCOUNTER — Other Ambulatory Visit: Payer: Self-pay

## 2024-03-20 ENCOUNTER — Other Ambulatory Visit (HOSPITAL_COMMUNITY): Payer: Self-pay

## 2024-03-20 ENCOUNTER — Telehealth (HOSPITAL_COMMUNITY): Admitting: Psychiatry

## 2024-03-20 VITALS — Wt 180.0 lb

## 2024-03-20 DIAGNOSIS — F319 Bipolar disorder, unspecified: Secondary | ICD-10-CM | POA: Diagnosis not present

## 2024-03-20 DIAGNOSIS — F5101 Primary insomnia: Secondary | ICD-10-CM

## 2024-03-20 DIAGNOSIS — F419 Anxiety disorder, unspecified: Secondary | ICD-10-CM | POA: Diagnosis not present

## 2024-03-20 MED ORDER — DULOXETINE HCL 60 MG PO CPEP
60.0000 mg | ORAL_CAPSULE | Freq: Every day | ORAL | 0 refills | Status: AC
  Filled 2024-03-20: qty 90, 90d supply, fill #0

## 2024-03-20 MED ORDER — HALOPERIDOL 5 MG PO TABS
5.0000 mg | ORAL_TABLET | Freq: Three times a day (TID) | ORAL | 1 refills | Status: AC
  Filled 2024-03-20: qty 90, 30d supply, fill #0

## 2024-03-20 MED ORDER — HYDROXYZINE PAMOATE 50 MG PO CAPS
50.0000 mg | ORAL_CAPSULE | Freq: Two times a day (BID) | ORAL | 1 refills | Status: AC
  Filled 2024-03-20: qty 60, 30d supply, fill #0

## 2024-03-20 MED ORDER — LAMOTRIGINE 150 MG PO TABS
150.0000 mg | ORAL_TABLET | Freq: Two times a day (BID) | ORAL | 0 refills | Status: AC
  Filled 2024-03-20: qty 180, 90d supply, fill #0

## 2024-03-20 MED ORDER — TRAZODONE HCL 100 MG PO TABS
100.0000 mg | ORAL_TABLET | Freq: Every day | ORAL | 1 refills | Status: AC
  Filled 2024-03-20: qty 30, 30d supply, fill #0

## 2024-03-20 NOTE — Progress Notes (Signed)
 [facility] MD Virtual Progress Note   Patient Location: Home Provider Location: Home Office  I connect with patient by video and verified that I am speaking with correct person by using two identifiers. I discussed the limitations of evaluation and management by telemedicine and the availability of in person appointments. I also discussed with the patient that there may be a patient responsible charge related to this service. The patient expressed understanding and agreed to proceed.  Brandy [name] [id number] 58 y.o.  03/20/2024 9:08 AM  History of Present Illness:  Patient is evaluated by video session.  She reported her depression is getting worse because her chronic health issues are not under control.  She is not happy because her shoulder surgery did not schedule.  She has a lot of pain in her shoulder, back and not happy with her family members.  Patient told she is getting very upset because everyone needs something from her.  She is not talking to her son who lives in [place] .  She reported her 81 year old daughter has some mental issues going and she is very unpredictable.  Her other son does not call her other than when he needs something.  She feels trapped in her house because husband have a lot of demands.  She has to take her husband to the doctor's appointment.  She has no energy and motivation to do things.  Patient told she has diabetes and supposed to see cardiology, eye doctor but the appointments were never scheduled.  She has neuropathy but has not seen neurology.  She is taking multiple medication for her psychiatric illness.  She feels no one cares.  She struggle with anxiety and lately does not feel that she can trust anyone.  She has a lot of ruminative thoughts and get easily emotional when talking about her chronic somatic and physical symptoms.  But she denies any active suicidal thoughts but feels sad.  She denies any voices but reported trust  issue.  Her appetite is okay.  She lives with her husband who required a lot of help and support due to his chronic health issues.  Patient denies drinking or using any illegal substances.  Despite taking Haldol , hydroxyzine  and trazodone  at bedtime she only sleeping few hours.  Patient denies any agitation, aggression.  She has appointment coming up to see her PCP in January.  She is not seeing any therapist.  We have recommended few times but patient never picked up the phone when front desk try to schedule appointment.  Past Psychiatric History: H/O inpatient in 04/07/96 after mother died.  Seen at [facility] from 01/04/199104-Jan-2001,   She saw Dr. Arlen and did IOP in 04-07-16.  No h/o suicidal attempt.  H/O paranoia, depression and paranoia. Had tried Zoloft, Wellbutrin, Rexulti , Klonopin , Abilify , Lexapro , Risperdal , temazepam , Lunesta  and Seroquel .   Past Medical History:  Diagnosis Date   Allergy    Anxiety    Depression    Diverticulitis    Edema    Gallstones    GERD (gastroesophageal reflux disease)    Hyperlipidemia    Hypertension    Osteoarthritis     Outpatient Encounter Medications as of 03/20/2024  Medication Sig   acetaminophen -codeine  (TYLENOL  #3) 300-30 MG tablet Take 1-2 tablets by mouth every 4 (four) hours as needed for moderate pain.   albuterol  (VENTOLIN  HFA) 108 (90 Base) MCG/ACT inhaler Inhale 2 puffs into the lungs every 4 (four) hours as needed for wheezing or shortness of breath.  amLODipine  (NORVASC ) 5 MG tablet Take 1 tablet (5 mg total) by mouth daily.   aspirin  EC 81 MG EC tablet Take 1 tablet (81 mg total) by mouth daily.   Blood Glucose Monitoring Suppl (ONE TOUCH ULTRA 2) w/Device KIT Use to check blood sugar 3 (three) times daily.   carvedilol  (COREG ) 25 MG tablet Take 1 tablet (25 mg total) by mouth 2 (two) times daily with a meal.   cetirizine  (ZYRTEC ) 10 MG tablet Take 1 tablet (10 mg total) by mouth daily.   cloNIDine  (CATAPRES ) 0.1 MG tablet Take 1 tablet (0.1  mg total) by mouth at bedtime as needed for hot flashes.   Continuous Glucose Sensor (FREESTYLE LIBRE 3 SENSOR) MISC Place 1 sensor on the skin every 14 days. Use to check glucose continuously   DULoxetine  (CYMBALTA ) 60 MG capsule Take 1 capsule (60 mg total) by mouth daily.   Fezolinetant  (VEOZAH ) 45 MG TABS Take 1 tablet (45 mg total) by mouth daily. For hot flashes   fluticasone  (FLONASE ) 50 MCG/ACT nasal spray Place 1 spray into both nostrils daily.   furosemide  (LASIX ) 20 MG tablet Take 1 tablet (20 mg total) by mouth daily.   glucose blood (ACCU-CHEK GUIDE) test strip Use as instructed three times daily   guaiFENesin  (MUCINEX ) 600 MG 12 hr tablet Take 1 tablet (600 mg total) by mouth 2 (two) times daily. (Patient not taking: Reported on 01/01/2024)   haloperidol  (HALDOL ) 5 MG tablet Take 1 tablet (5 mg total) by mouth 2 (two) times daily.   hydrOXYzine  (VISTARIL ) 50 MG capsule Take 1 capsule (50 mg total) by mouth daily as needed for anxiety.   lamoTRIgine  (LAMICTAL ) 150 MG tablet Take 1 tablet (150 mg total) by mouth 2 (two) times daily.   Lancets (ONETOUCH DELICA PLUS LANCET33G) MISC use as directed 3 times daily before meals   losartan -hydrochlorothiazide  (HYZAAR ) 100-25 MG tablet Take 1 tablet by mouth daily.   meloxicam  (MOBIC ) 7.5 MG tablet Take 1 tablet (7.5 mg total) by mouth daily.   metFORMIN  (GLUCOPHAGE ) 500 MG tablet Take 1 tablet (500 mg total) by mouth 2 (two) times daily with a meal.   methocarbamol  (ROBAXIN ) 750 MG tablet Take 1 tablet (750 mg total) by mouth 2 (two) times daily as needed for muscle spasms.   nicotine  (NICODERM CQ ) 14 mg/24hr patch Place 1 patch (14 mg total) onto the skin daily for 4 weeks then decrease to 7 mg patches.   nicotine  (NICODERM CQ ) 7 mg/24hr patch Place 1 patch (7 mg total) onto the skin daily.   nitroGLYCERIN  (NITRODUR - DOSED IN MG/24 HR) 0.2 mg/hr patch Place 1 patch (0.2 mg total) onto the skin daily. Apply patch near the affected area and  change location daily.   ondansetron  (ZOFRAN -ODT) 4 MG disintegrating tablet Dissolve 1 tablet (4 mg total) in mouth every 8 (eight) hours as needed for nausea or vomiting.   pantoprazole  (PROTONIX ) 40 MG tablet Take 1 tablet (40 mg total) by mouth daily.   potassium chloride  SA (KLOR-CON  M) 20 MEQ tablet Take 1 tablet (20 mEq total) by mouth daily.   pregabalin  (LYRICA ) 75 MG capsule Take 1 capsule (75 mg total) by mouth 2 (two) times daily.   pregabalin  (LYRICA ) 75 MG capsule Take 1 capsule by mouth 2 times daily   promethazine -dextromethorphan (PROMETHAZINE -DM) 6.25-15 MG/5ML syrup Take 5 mLs by mouth 4 (four) times daily as needed for cough.   rosuvastatin  (CRESTOR ) 10 MG tablet Take 1 tablet (10 mg total) by  mouth daily.   Semaglutide ,0.25 or 0.5MG /DOS, (OZEMPIC , 0.25 OR 0.5 MG/DOSE,) 2 MG/3ML SOPN Inject 0.5 mg into the skin once a week.   traZODone  (DESYREL ) 100 MG tablet Take 1 tablet (100 mg total) by mouth at bedtime.   [DISCONTINUED] losartan  (COZAAR ) 100 MG tablet Take 1 tablet (100 mg total) by mouth daily.   No facility-administered encounter medications on file as of 03/20/2024.    No results found for this or any previous visit (from the past 2160 hours).    Psychiatric Specialty Exam: Physical Exam  Review of Systems  Constitutional:        Hot flashes  Musculoskeletal:        Left shoulder pain  Neurological:        Feet have numbness   Psychiatric/Behavioral:  Positive for dysphoric mood.     Weight 180 lb (81.6 kg), last menstrual period 11/02/2020.There is no height or weight on file to calculate BMI.  General Appearance: Casual  Eye Contact:  Fair  Speech:  Slow and hoarseness  Volume:  Decreased  Mood:  Anxious and Dysphoric  Affect:  Depressed  Thought Process:  Descriptions of Associations: Intact  Orientation:  Full (Time, Place, and Person)  Thought Content:  Paranoid Ideation, Rumination, and trust issues  Suicidal Thoughts:  No  Homicidal  Thoughts:  No  Memory:  Immediate;   Fair Recent;   Fair Remote;   Fair  Judgement:  Fair  Insight:  Fair  Psychomotor Activity:  Decreased  Concentration:  Concentration: Fair and Attention Span: Fair  Recall:  [employer] of Knowledge:  Fair  Language:  Fair  Akathisia:  No  Handed:  Right  AIMS (if indicated):     Assets:  Communication Skills Desire for Improvement Housing  ADL's:  Intact  Cognition:  WNL  Sleep:  2-3 hrs       12/12/2023    2:25 PM 09/12/2023   10:38 AM 07/21/2023    9:07 AM 04/27/2023    2:57 PM 08/24/2022    8:29 PM  Depression screen PHQ 2/9  Decreased Interest 3 3 2 1 3   Down, Depressed, Hopeless 2 3 1 3 3   PHQ - 2 Score 5 6 3 4 6   Altered sleeping 3 3 1 1 3   Tired, decreased energy 3 3 2 3 3   Change in appetite 2 1 1 3 3   Feeling bad or failure about yourself  2 1 0 3 3  Trouble concentrating 3 1 1 3 3   Moving slowly or fidgety/restless 3 0 1 0 3  Suicidal thoughts 2 0 1 1 1   PHQ-9 Score 23  15  10  18  25    Difficult doing work/chores  Not difficult at all Somewhat difficult Extremely dIfficult      Data saved with a previous flowsheet row definition    Assessment/Plan: Bipolar I disorder (HCC) - Plan: lamoTRIgine  (LAMICTAL ) 150 MG tablet, haloperidol  (HALDOL ) 5 MG tablet  Anxiety - Plan: DULoxetine  (CYMBALTA ) 60 MG capsule, lamoTRIgine  (LAMICTAL ) 150 MG tablet, hydrOXYzine  (VISTARIL ) 50 MG capsule, haloperidol  (HALDOL ) 5 MG tablet  Primary insomnia - Plan: hydrOXYzine  (VISTARIL ) 50 MG capsule, traZODone  (DESYREL ) 100 MG tablet, haloperidol  (HALDOL ) 5 MG tablet  Patient is 58 year old African-American employed, married female with multiple health issues noticeable shoulder pain, numbness, hypertension, GERD, diabetes, bipolar disorder, insomnia, anxiety and currently taking multiple medication.  She is disappointed because her shoulder surgery did not scheduled.  She is disappointed because not able to  see specialist for her eye exam,  cardiology and neurology.  Discussed chronic stressors as patient feel holidays are very difficult and everyone is demanding and she cannot provide everything to her family.  Reassurance given.  She feels the current medicine is working but this time of the year is overwhelming.  I discussed again to consider therapy and patient agree to consider.  I emphasized that she need to pick up the phone from the office when they try to schedule appointment with therapist.  I will also forward my note to her primary care as patient concerned about her multiple health issues which is not addressed.  I discussed to try higher dose of Haldol  and hydroxyzine  to help her symptoms.  Patient agree with the plan.  Will try increasing hydroxyzine  50 mg a day to 50 mg twice a day and Haldol  5 mg twice a day to 5 mg in the morning and 10 mg at bedtime.  Continue Cymbalta  60 mg daily, Lamictal  150 mg 2 times a day.  Recommend to call back if she has any question or any concern.  Follow-up in 2 months.  Discussed Christmas plan with the patient.  Her plan is to stay home with her husband.  Discussed safety concerns and anytime having active suicidal thoughts or homicidal thought that she need to call 911 or go to local emergency room.  Follow Up Instructions:     I discussed the assessment and treatment plan with the patient. The patient was provided an opportunity to ask questions and all were answered. The patient agreed with the plan and demonstrated an understanding of the instructions.   The patient was advised to call back or seek an in-person evaluation if the symptoms worsen or if the condition fails to improve as anticipated.    Collaboration of Care: Other provider involved in patient's care AEB notes are available in epic to review  Patient/Guardian was advised Release of Information must be obtained prior to any record release in order to collaborate their care with an outside provider. Patient/Guardian was  advised if they have not already done so to contact the registration department to sign all necessary forms in order for us  to release information regarding their care.   Consent: Patient/Guardian gives verbal consent for treatment and assignment of benefits for services provided during this visit. Patient/Guardian expressed understanding and agreed to proceed.     Total encounter time 29 minutes which includes face-to-face time, chart reviewed, care coordination, order entry and documentation during this encounter.   Note: This document was prepared by [employer] voice dictation technology and any errors that results from this process are unintentional.    Leni ONEIDA Client, MD 03/20/2024

## 2024-03-21 ENCOUNTER — Other Ambulatory Visit (HOSPITAL_COMMUNITY): Payer: Self-pay

## 2024-04-02 ENCOUNTER — Other Ambulatory Visit (HOSPITAL_COMMUNITY): Payer: Self-pay

## 2024-04-09 ENCOUNTER — Other Ambulatory Visit (HOSPITAL_COMMUNITY): Payer: Self-pay

## 2024-04-09 ENCOUNTER — Other Ambulatory Visit: Payer: Self-pay | Admitting: Physician Assistant

## 2024-04-09 ENCOUNTER — Other Ambulatory Visit: Payer: Self-pay

## 2024-04-09 DIAGNOSIS — I152 Hypertension secondary to endocrine disorders: Secondary | ICD-10-CM

## 2024-04-09 DIAGNOSIS — N951 Menopausal and female climacteric states: Secondary | ICD-10-CM

## 2024-04-09 MED ORDER — CLONIDINE HCL 0.1 MG PO TABS
0.1000 mg | ORAL_TABLET | Freq: Every evening | ORAL | 1 refills | Status: AC | PRN
  Filled 2024-04-09 (×2): qty 90, 90d supply, fill #0

## 2024-04-09 MED ORDER — AMLODIPINE BESYLATE 5 MG PO TABS
5.0000 mg | ORAL_TABLET | Freq: Every day | ORAL | 0 refills | Status: DC
  Filled 2024-04-09 – 2024-04-12 (×2): qty 30, 30d supply, fill #0

## 2024-04-10 ENCOUNTER — Other Ambulatory Visit: Payer: Self-pay

## 2024-04-12 ENCOUNTER — Other Ambulatory Visit: Payer: Self-pay

## 2024-04-12 ENCOUNTER — Telehealth: Payer: Self-pay | Admitting: Family Medicine

## 2024-04-12 DIAGNOSIS — R490 Dysphonia: Secondary | ICD-10-CM

## 2024-04-12 DIAGNOSIS — E1165 Type 2 diabetes mellitus with hyperglycemia: Secondary | ICD-10-CM

## 2024-04-12 DIAGNOSIS — E1159 Type 2 diabetes mellitus with other circulatory complications: Secondary | ICD-10-CM

## 2024-04-12 DIAGNOSIS — K219 Gastro-esophageal reflux disease without esophagitis: Secondary | ICD-10-CM

## 2024-04-12 DIAGNOSIS — R600 Localized edema: Secondary | ICD-10-CM

## 2024-04-12 NOTE — Telephone Encounter (Signed)
 Copied from CRM #[id number]. Topic: Clinical - Medical Advice >> Apr 12, 2024  2:57 PM   Myrick T wrote:  Reason for CRM: patient called said she is always hoarse and needs to know if sleeping with her fan on caused it or a medication she is taking. She also wants more than a 30 day supply of all her meds.

## 2024-04-12 NOTE — Telephone Encounter (Signed)
 Routing to PCP for review.

## 2024-04-15 ENCOUNTER — Encounter (HOSPITAL_COMMUNITY): Payer: Self-pay

## 2024-04-15 ENCOUNTER — Other Ambulatory Visit: Payer: Self-pay

## 2024-04-15 ENCOUNTER — Other Ambulatory Visit (HOSPITAL_COMMUNITY): Payer: Self-pay

## 2024-04-15 MED ORDER — AMLODIPINE BESYLATE 5 MG PO TABS
5.0000 mg | ORAL_TABLET | Freq: Every day | ORAL | 1 refills | Status: AC
  Filled 2024-04-15: qty 90, 90d supply, fill #0

## 2024-04-15 MED ORDER — LOSARTAN POTASSIUM-HCTZ 100-25 MG PO TABS
1.0000 | ORAL_TABLET | Freq: Every day | ORAL | 1 refills | Status: AC
  Filled 2024-04-15: qty 90, 90d supply, fill #0

## 2024-04-15 MED ORDER — CARVEDILOL 25 MG PO TABS
25.0000 mg | ORAL_TABLET | Freq: Two times a day (BID) | ORAL | 1 refills | Status: AC
  Filled 2024-04-15: qty 180, 90d supply, fill #0

## 2024-04-15 MED ORDER — METFORMIN HCL 500 MG PO TABS
500.0000 mg | ORAL_TABLET | Freq: Two times a day (BID) | ORAL | 1 refills | Status: AC
  Filled 2024-04-15: qty 180, 90d supply, fill #0

## 2024-04-15 MED ORDER — OZEMPIC (0.25 OR 0.5 MG/DOSE) 2 MG/3ML ~~LOC~~ SOPN
0.5000 mg | PEN_INJECTOR | SUBCUTANEOUS | 1 refills | Status: AC
  Filled 2024-04-15 – 2024-04-29 (×2): qty 9, 84d supply, fill #0

## 2024-04-15 MED ORDER — PANTOPRAZOLE SODIUM 40 MG PO TBEC
40.0000 mg | DELAYED_RELEASE_TABLET | Freq: Every day | ORAL | 1 refills | Status: AC
  Filled 2024-04-15: qty 90, 90d supply, fill #0

## 2024-04-15 MED ORDER — FUROSEMIDE 20 MG PO TABS
20.0000 mg | ORAL_TABLET | Freq: Every day | ORAL | 1 refills | Status: AC
  Filled 2024-04-15: qty 90, 90d supply, fill #0

## 2024-04-15 NOTE — Telephone Encounter (Signed)
 I have sent a prescription for 90-day supply of her medications to the pharmacy.  I also referred her to ENT to evaluate her for voice hoarseness.

## 2024-04-15 NOTE — Addendum Note (Signed)
 Addended by: Malik Ruffino on: 04/15/2024 05:47 PM   Modules accepted: Orders

## 2024-04-16 ENCOUNTER — Other Ambulatory Visit (HOSPITAL_COMMUNITY): Payer: Self-pay

## 2024-04-16 NOTE — Telephone Encounter (Signed)
Patient has been informed of referral being placed

## 2024-04-25 ENCOUNTER — Other Ambulatory Visit (HOSPITAL_COMMUNITY): Payer: Self-pay

## 2024-04-29 ENCOUNTER — Other Ambulatory Visit: Payer: Self-pay

## 2024-04-30 ENCOUNTER — Other Ambulatory Visit (HOSPITAL_COMMUNITY): Payer: Self-pay

## 2024-05-16 ENCOUNTER — Institutional Professional Consult (permissible substitution) (INDEPENDENT_AMBULATORY_CARE_PROVIDER_SITE_OTHER)

## 2024-05-22 ENCOUNTER — Telehealth (HOSPITAL_COMMUNITY): Admitting: Psychiatry

## 2024-06-03 ENCOUNTER — Ambulatory Visit: Payer: Self-pay | Admitting: Family Medicine

## 2024-06-11 ENCOUNTER — Encounter: Payer: Self-pay | Admitting: Obstetrics and Gynecology

## 2024-09-17 ENCOUNTER — Ambulatory Visit
